# Patient Record
Sex: Male | Born: 1935 | Race: White | Hispanic: No | Marital: Married | Smoking: Former smoker
Health system: Southern US, Community
[De-identification: ages and names within clinical notes are randomized; demographics above are authoritative.]

## PROBLEM LIST (undated history)

## (undated) DIAGNOSIS — K5790 Diverticulosis of intestine, part unspecified, without perforation or abscess without bleeding: Secondary | ICD-10-CM

## (undated) DIAGNOSIS — D509 Iron deficiency anemia, unspecified: Secondary | ICD-10-CM

## (undated) DIAGNOSIS — I2510 Atherosclerotic heart disease of native coronary artery without angina pectoris: Secondary | ICD-10-CM

## (undated) DIAGNOSIS — Z8719 Personal history of other diseases of the digestive system: Secondary | ICD-10-CM

## (undated) DIAGNOSIS — I4891 Unspecified atrial fibrillation: Secondary | ICD-10-CM

## (undated) DIAGNOSIS — Z95810 Presence of automatic (implantable) cardiac defibrillator: Secondary | ICD-10-CM

## (undated) DIAGNOSIS — IMO0002 Reserved for concepts with insufficient information to code with codable children: Secondary | ICD-10-CM

## (undated) DIAGNOSIS — I10 Essential (primary) hypertension: Secondary | ICD-10-CM

## (undated) DIAGNOSIS — Z950 Presence of cardiac pacemaker: Secondary | ICD-10-CM

## (undated) DIAGNOSIS — K2970 Gastritis, unspecified, without bleeding: Secondary | ICD-10-CM

## (undated) DIAGNOSIS — K449 Diaphragmatic hernia without obstruction or gangrene: Secondary | ICD-10-CM

## (undated) DIAGNOSIS — E119 Type 2 diabetes mellitus without complications: Secondary | ICD-10-CM

## (undated) DIAGNOSIS — I428 Other cardiomyopathies: Secondary | ICD-10-CM

## (undated) HISTORY — DX: Unspecified atrial fibrillation: I48.91

## (undated) HISTORY — DX: Other cardiomyopathies: I42.8

## (undated) HISTORY — PX: CARDIAC DEFIBRILLATOR PLACEMENT: SHX171

## (undated) HISTORY — DX: Diverticulosis of intestine, part unspecified, without perforation or abscess without bleeding: K57.90

## (undated) HISTORY — PX: COLONOSCOPY: SHX174

## (undated) HISTORY — DX: Type 2 diabetes mellitus without complications: E11.9

## (undated) HISTORY — DX: Iron deficiency anemia, unspecified: D50.9

## (undated) HISTORY — DX: Gastritis, unspecified, without bleeding: K29.70

## (undated) HISTORY — DX: Presence of automatic (implantable) cardiac defibrillator: Z95.810

## (undated) HISTORY — DX: Diaphragmatic hernia without obstruction or gangrene: K44.9

## (undated) HISTORY — DX: Personal history of other diseases of the digestive system: Z87.19

## (undated) HISTORY — DX: Atherosclerotic heart disease of native coronary artery without angina pectoris: I25.10

---

## 2009-02-18 HISTORY — PX: CARDIAC CATHETERIZATION: SHX172

## 2011-02-19 HISTORY — PX: CARDIAC CATHETERIZATION: SHX172

## 2012-01-27 HISTORY — PX: OTHER SURGICAL HISTORY: SHX169

## 2012-01-27 HISTORY — PX: PACEMAKER INSERTION: SHX728

## 2012-02-19 HISTORY — PX: FETAL BLOOD TRANSFUSION: SHX1602

## 2012-02-19 HISTORY — PX: UPPER GASTROINTESTINAL ENDOSCOPY: SHX188

## 2012-12-30 ENCOUNTER — Inpatient Hospital Stay: Payer: Self-pay | Admitting: Internal Medicine

## 2012-12-30 DIAGNOSIS — I4892 Unspecified atrial flutter: Secondary | ICD-10-CM

## 2012-12-30 DIAGNOSIS — Z7901 Long term (current) use of anticoagulants: Secondary | ICD-10-CM

## 2012-12-30 DIAGNOSIS — I428 Other cardiomyopathies: Secondary | ICD-10-CM

## 2012-12-30 DIAGNOSIS — I059 Rheumatic mitral valve disease, unspecified: Secondary | ICD-10-CM

## 2012-12-30 DIAGNOSIS — I4891 Unspecified atrial fibrillation: Secondary | ICD-10-CM

## 2012-12-30 LAB — CBC WITH DIFFERENTIAL/PLATELET
Basophil #: 0.1 10*3/uL (ref 0.0–0.1)
Basophil %: 1 %
Eosinophil #: 0.2 10*3/uL (ref 0.0–0.7)
Eosinophil %: 1.1 %
HCT: 24.2 % — ABNORMAL LOW (ref 40.0–52.0)
Lymphocyte %: 17.2 %
MCHC: 28.3 g/dL — ABNORMAL LOW (ref 32.0–36.0)
Monocyte #: 1.1 x10 3/mm — ABNORMAL HIGH (ref 0.2–1.0)
Neutrophil #: 10.2 10*3/uL — ABNORMAL HIGH (ref 1.4–6.5)
Platelet: 378 10*3/uL (ref 150–440)
RBC: 4.29 10*6/uL — ABNORMAL LOW (ref 4.40–5.90)
RDW: 19 % — ABNORMAL HIGH (ref 11.5–14.5)
WBC: 14 10*3/uL — ABNORMAL HIGH (ref 3.8–10.6)

## 2012-12-30 LAB — COMPREHENSIVE METABOLIC PANEL
Albumin: 3.2 g/dL — ABNORMAL LOW (ref 3.4–5.0)
Anion Gap: 4 — ABNORMAL LOW (ref 7–16)
BUN: 26 mg/dL — ABNORMAL HIGH (ref 7–18)
Calcium, Total: 8.9 mg/dL (ref 8.5–10.1)
Chloride: 107 mmol/L (ref 98–107)
Creatinine: 1.18 mg/dL (ref 0.60–1.30)
Glucose: 188 mg/dL — ABNORMAL HIGH (ref 65–99)
Osmolality: 285 (ref 275–301)
Potassium: 4 mmol/L (ref 3.5–5.1)
SGOT(AST): 32 U/L (ref 15–37)
Sodium: 138 mmol/L (ref 136–145)
Total Protein: 7.6 g/dL (ref 6.4–8.2)

## 2012-12-30 LAB — CK TOTAL AND CKMB (NOT AT ARMC)
CK, Total: 67 U/L (ref 35–232)
CK, Total: 58 U/L (ref 35–232)
CK-MB: 1.6 ng/mL (ref 0.5–3.6)
CK-MB: 1.9 ng/mL (ref 0.5–3.6)

## 2012-12-30 LAB — DIGOXIN LEVEL: Digoxin: 1.03 ng/mL

## 2012-12-30 LAB — PROTIME-INR
INR: 1.3
Prothrombin Time: 16.6 secs — ABNORMAL HIGH (ref 11.5–14.7)

## 2012-12-30 LAB — TROPONIN I: Troponin-I: 0.08 ng/mL — ABNORMAL HIGH

## 2012-12-30 LAB — HEMOGLOBIN: HGB: 7.9 g/dL — ABNORMAL LOW (ref 13.0–18.0)

## 2012-12-30 LAB — PRO B NATRIURETIC PEPTIDE: B-Type Natriuretic Peptide: 1747 pg/mL — ABNORMAL HIGH (ref 0–450)

## 2012-12-31 DIAGNOSIS — I4891 Unspecified atrial fibrillation: Secondary | ICD-10-CM

## 2012-12-31 DIAGNOSIS — Z7901 Long term (current) use of anticoagulants: Secondary | ICD-10-CM

## 2012-12-31 DIAGNOSIS — I4892 Unspecified atrial flutter: Secondary | ICD-10-CM

## 2012-12-31 LAB — CBC WITH DIFFERENTIAL/PLATELET
Basophil %: 1.2 %
Eosinophil #: 0.2 10*3/uL (ref 0.0–0.7)
Eosinophil %: 1.9 %
HCT: 29.1 % — ABNORMAL LOW (ref 40.0–52.0)
Lymphocyte #: 1.5 10*3/uL (ref 1.0–3.6)
Lymphocyte %: 12.9 %
MCHC: 30.6 g/dL — ABNORMAL LOW (ref 32.0–36.0)
MCV: 62 fL — ABNORMAL LOW (ref 80–100)
Monocyte #: 1.1 x10 3/mm — ABNORMAL HIGH (ref 0.2–1.0)
Neutrophil #: 8.6 10*3/uL — ABNORMAL HIGH (ref 1.4–6.5)
Platelet: 294 10*3/uL (ref 150–440)
RBC: 4.73 10*6/uL (ref 4.40–5.90)
RDW: 27 % — ABNORMAL HIGH (ref 11.5–14.5)
WBC: 11.5 10*3/uL — ABNORMAL HIGH (ref 3.8–10.6)

## 2012-12-31 LAB — URINALYSIS, COMPLETE
Bacteria: NONE SEEN
Bilirubin,UR: NEGATIVE
Leukocyte Esterase: NEGATIVE
Protein: NEGATIVE
RBC,UR: 3 /HPF (ref 0–5)
Specific Gravity: 1.025 (ref 1.003–1.030)
WBC UR: 6 /HPF (ref 0–5)

## 2012-12-31 LAB — BASIC METABOLIC PANEL
BUN: 23 mg/dL — ABNORMAL HIGH (ref 7–18)
Calcium, Total: 8.6 mg/dL (ref 8.5–10.1)
EGFR (Non-African Amer.): >60
Glucose: 135 mg/dL — ABNORMAL HIGH (ref 65–99)
Sodium: 139 mmol/L (ref 136–145)

## 2012-12-31 LAB — TROPONIN I: Troponin-I: 0.07 ng/mL — ABNORMAL HIGH

## 2012-12-31 LAB — KOH PREP

## 2013-01-01 LAB — CBC WITH DIFFERENTIAL/PLATELET
Basophil %: 0.6 %
Lymphocyte #: 1.5 10*3/uL (ref 1.0–3.6)
Lymphocyte %: 15.9 %
MCH: 19.4 pg — ABNORMAL LOW (ref 26.0–34.0)
MCHC: 31 g/dL — ABNORMAL LOW (ref 32.0–36.0)
MCV: 63 fL — ABNORMAL LOW (ref 80–100)
Monocyte #: 0.9 x10 3/mm (ref 0.2–1.0)
Monocyte %: 9.9 %
Neutrophil #: 6.6 10*3/uL — ABNORMAL HIGH (ref 1.4–6.5)
Platelet: 252 10*3/uL (ref 150–440)
RBC: 4.38 10*6/uL — ABNORMAL LOW (ref 4.40–5.90)
RDW: 27.2 % — ABNORMAL HIGH (ref 11.5–14.5)
WBC: 9.4 10*3/uL (ref 3.8–10.6)

## 2013-01-01 LAB — BASIC METABOLIC PANEL
Anion Gap: 5 — ABNORMAL LOW (ref 7–16)
Chloride: 107 mmol/L (ref 98–107)
Co2: 27 mmol/L (ref 21–32)
EGFR (African American): >60
EGFR (Non-African Amer.): >60
Glucose: 125 mg/dL — ABNORMAL HIGH (ref 65–99)
Osmolality: 281 (ref 275–301)
Potassium: 3.5 mmol/L (ref 3.5–5.1)
Sodium: 139 mmol/L (ref 136–145)

## 2013-01-04 LAB — IRON AND TIBC
Iron Saturation: 6 %
Iron: 26 ug/dL — ABNORMAL LOW (ref 65–175)
Unbound Iron-Bind.Cap.: 429 ug/dL

## 2013-01-05 DIAGNOSIS — I428 Other cardiomyopathies: Secondary | ICD-10-CM

## 2013-01-05 DIAGNOSIS — I5020 Unspecified systolic (congestive) heart failure: Secondary | ICD-10-CM

## 2013-01-05 LAB — CBC WITH DIFFERENTIAL/PLATELET
HCT: 32 % — ABNORMAL LOW (ref 40.0–52.0)
Lymphocyte %: 12.6 %
MCH: 19 pg — ABNORMAL LOW (ref 26.0–34.0)
MCHC: 30.4 g/dL — ABNORMAL LOW (ref 32.0–36.0)
Monocyte %: 8.7 %

## 2013-01-07 ENCOUNTER — Telehealth: Payer: Self-pay

## 2013-01-07 NOTE — Telephone Encounter (Signed)
Patient contacted regarding discharge from [facility] on 01/04/13.  Patient understands to follow up with provider Dr. Mariah Milling on 01/12/13 at 10:15 at [facility]. Patient understands discharge instructions? yes Patient understands medications and regiment? yes Patient understands to bring all medications to this visit? yes

## 2013-01-12 ENCOUNTER — Encounter (INDEPENDENT_AMBULATORY_CARE_PROVIDER_SITE_OTHER): Payer: Self-pay

## 2013-01-12 ENCOUNTER — Encounter: Payer: Self-pay | Admitting: Cardiovascular Disease

## 2013-01-12 ENCOUNTER — Ambulatory Visit (INDEPENDENT_AMBULATORY_CARE_PROVIDER_SITE_OTHER): Payer: Medicare Other | Admitting: Cardiovascular Disease

## 2013-01-12 VITALS — BP 120/70 | HR 79 | Ht 74.0 in | Wt 235.5 lb

## 2013-01-12 DIAGNOSIS — K922 Gastrointestinal hemorrhage, unspecified: Secondary | ICD-10-CM | POA: Insufficient documentation

## 2013-01-12 DIAGNOSIS — Z95810 Presence of automatic (implantable) cardiac defibrillator: Secondary | ICD-10-CM

## 2013-01-12 DIAGNOSIS — I4891 Unspecified atrial fibrillation: Secondary | ICD-10-CM

## 2013-01-12 DIAGNOSIS — I5042 Chronic combined systolic (congestive) and diastolic (congestive) heart failure: Secondary | ICD-10-CM

## 2013-01-12 DIAGNOSIS — I509 Heart failure, unspecified: Secondary | ICD-10-CM

## 2013-01-12 DIAGNOSIS — I428 Other cardiomyopathies: Secondary | ICD-10-CM

## 2013-01-12 DIAGNOSIS — I420 Dilated cardiomyopathy: Secondary | ICD-10-CM

## 2013-01-12 HISTORY — DX: Presence of automatic (implantable) cardiac defibrillator: Z95.810

## 2013-01-12 NOTE — Assessment & Plan Note (Signed)
Suggested he restart his anticoagulation in one week time. He is on PPI twice a day. Heart rate reasonably well controlled (paced).

## 2013-01-12 NOTE — Assessment & Plan Note (Signed)
Suggested he continue his current medications. No changes made at this time. Appears relatively euvolemic

## 2013-01-12 NOTE — Assessment & Plan Note (Addendum)
He has indicated he would like to transfer his care from [facility] to our office. We will make an appointment with him to see Dr. Sherryl Manges. He reports having a Medtronic device.    Notes indicate device was placed 01/27/2012 at [facility] with indication class III congestive heart failure, left bundle branch block  with QRS 170 ms, underlying atrial fibrillation, ejection fraction of less than 30%

## 2013-01-12 NOTE — Progress Notes (Addendum)
Patient ID: Nicolas Morris, male    DOB: Jul 17, 1933, 79 y.o.   MRN: [id number]  HPI Comments: 79 year old gentleman with nonischemic cardiomyopathy, cardiac catheterization October 2010 showing no significant CAD, ejection fraction 45% at that time, with notes indicating ejection fraction less than 30% at [facility] at the end of 2013, atrial fibrillation/flutter, type 2 diabetes, previously seen at [facility], started on anticoagulation for atrial fibrillation several years ago, ICD placement December 2013, several cardiac catheterizations in 2014 one in January and one in May, by his report with several ICD shocks in October 2014, recent admission to the hospital for bright red blood per rectum. Cardiology was consulted for management of his atrial fibrillation, anticoagulation, chest pain. Xarelto was held and recommendation was for him to hold this for at least 2 weeks. He presents now one week after discharge from the hospital. He did have 3 units of packed red blood cells in the hospital for initial hemoglobin of 6.9.  In followup today, he reports that he has been feeling better. No further chest pain after his blood transfusions. It was felt that he had demand ischemia in the setting of anemia with improvement after transfusion. Cardiac enzymes were negative during his hospital course.   Echocardiogram showed ejection fraction 45-50%. Inferior and posterior wall hypokinesis, ICD wire noted, mildly elevated right ventricular systolic pressures, mild MR, mildly dilated left atrium  BNP 1700, creatinine 1.18, normal LFTs CT abdomen and pelvis showed bilateral pleural effusions right greater than left, hepatic Steve tightness with early cirrhosis with ascites, mild splenomegaly, iron levels were extremely low at 26, iron saturation 6  EGD was performed that showed small hiatal hernia, esophagitis, erosions/gastritis, gastric varices, diverticulosis on colonoscopy with some internal  hemorrhoids. He was evaluated by Dr. Marva Panda and started on PPI twice a day  EKG today shows ventricularly  paced rhythm rate 79 beats per minute   Outpatient Encounter Prescriptions as of 01/12/2013  Medication Sig  . acetaminophen (TYLENOL) 325 MG tablet Take 650 mg by mouth every 4 (four) hours as needed.  . carvedilol (COREG) 6.25 MG tablet Take 6.25 mg by mouth 2 (two) times daily with a meal.  . digoxin (LANOXIN) 0.25 MG tablet Take 0.25 mg by mouth daily.  . furosemide (LASIX) 40 MG tablet Take 40 mg by mouth 2 (two) times daily.  . meclizine (ANTIVERT) 25 MG tablet Take 25 mg by mouth 3 (three) times daily as needed for dizziness.  . metFORMIN (GLUCOPHAGE) 500 MG tablet Take by mouth 2 (two) times daily with a meal.  . pantoprazole (PROTONIX) 40 MG tablet Take 40 mg by mouth 2 (two) times daily.  . potassium chloride (MICRO-K) 10 MEQ CR capsule Take 20 mEq by mouth daily.   . sertraline (ZOLOFT) 100 MG tablet Take 100 mg by mouth daily.  . traZODone (DESYREL) 50 MG tablet Take 50 mg by mouth at bedtime.  . [DISCONTINUED] Ferrous Sulfate (FEROSUL PO) Take 325 mg by mouth 2 (two) times daily.     Review of Systems  Constitutional: Negative.   HENT: Negative.   Eyes: Negative.   Respiratory: Negative.   Cardiovascular: Negative.   Gastrointestinal: Negative.   Endocrine: Negative.   Musculoskeletal: Negative.   Skin: Negative.   Allergic/Immunologic: Negative.   Neurological: Negative.   Hematological: Negative.   Psychiatric/Behavioral: Negative.   All other systems reviewed and are negative.    BP 120/70  Pulse 79  Ht 6\' 2"  (1.88 m)  Wt 235  lb 8 oz (106.822 kg)  BMI 30.22 kg/m2  Physical Exam  Nursing note and vitals reviewed. Constitutional: He is oriented to person, place, and time. He appears well-developed and well-nourished.  HENT:  Head: Normocephalic.  Nose: Nose normal.  Mouth/Throat: Oropharynx is clear and moist.  Eyes: Conjunctivae are normal.  Pupils are equal, round, and reactive to light.  Neck: Normal range of motion. Neck supple. No JVD present.  Cardiovascular: Normal rate, regular rhythm, S1 normal, S2 normal, normal heart sounds and intact distal pulses.  Exam reveals no gallop and no friction rub.   No murmur heard. Pulmonary/Chest: Effort normal and breath sounds normal. No respiratory distress. He has no wheezes. He has no rales. He exhibits no tenderness.  Abdominal: Soft. Bowel sounds are normal. He exhibits no distension. There is no tenderness.  Musculoskeletal: Normal range of motion. He exhibits no edema and no tenderness.  Lymphadenopathy:    He has no cervical adenopathy.  Neurological: He is alert and oriented to person, place, and time. Coordination normal.  Skin: Skin is warm and dry. No rash noted. No erythema.  Psychiatric: He has a normal mood and affect. His behavior is normal. Judgment and thought content normal.      Assessment and Plan

## 2013-01-12 NOTE — Assessment & Plan Note (Signed)
He denies any discolored or maroon stools. We have suggested he closely monitor his stool color after he restarts anticoagulation.

## 2013-01-12 NOTE — Assessment & Plan Note (Addendum)
Mildly depressed ejection fraction. Cardiac catheterization from the [facility] [place] heart care dated 12/01/2008 showing torturous but normal coronary arteries, elevated systemic and left ventricular filling pressures, ejection fraction 45-50%, mild distal anterior hypokinesis, recommendation made for treatment of blood pressure at that time

## 2013-01-12 NOTE — Patient Instructions (Signed)
You are doing well. No medication changes were made.  Restart xarelto in one week December 2nd, one a day Keep a look out for dark, black stool We will set up a follow up appt with electrophysiology to check ICD  Please call us if you have new issues that need to be addressed before your next appt.  Your physician wants you to follow-up in: 3 months.  You will receive a reminder letter in the mail two months in advance. If you don't receive a letter, please call our office to schedule the follow-up appointment.

## 2013-01-20 ENCOUNTER — Other Ambulatory Visit: Payer: Self-pay

## 2013-01-20 MED ORDER — FUROSEMIDE 40 MG PO TABS: 40.0000 mg | ORAL_TABLET | Freq: Two times a day (BID) | ORAL | Status: DC

## 2013-01-20 NOTE — Telephone Encounter (Signed)
Requested Prescriptions   Signed Prescriptions Disp Refills  . furosemide (LASIX) 40 MG tablet 30 tablet 3    Sig: Take 1 tablet (40 mg total) by mouth 2 (two) times daily.    Authorizing Provider: Antonieta Iba    Ordering User: Kendrick Fries

## 2013-01-21 ENCOUNTER — Encounter: Payer: Medicare Other | Admitting: Internal Medicine

## 2013-01-22 ENCOUNTER — Telehealth: Payer: Self-pay

## 2013-01-22 NOTE — Telephone Encounter (Signed)
Checking status of medical necessity faxed yesterday for a walker for pt please call

## 2013-01-22 NOTE — Telephone Encounter (Signed)
Left message for Nedra Hai that I have not received a LMN for pt. Asked him to resend & left fax #.

## 2013-01-26 ENCOUNTER — Encounter: Payer: Self-pay | Admitting: Internal Medicine

## 2013-01-26 ENCOUNTER — Ambulatory Visit (INDEPENDENT_AMBULATORY_CARE_PROVIDER_SITE_OTHER): Payer: Medicare Other | Admitting: Internal Medicine

## 2013-01-26 VITALS — BP 120/62 | HR 72 | Ht 74.0 in | Wt 244.2 lb

## 2013-01-26 DIAGNOSIS — I420 Dilated cardiomyopathy: Secondary | ICD-10-CM

## 2013-01-26 DIAGNOSIS — F329 Major depressive disorder, single episode, unspecified: Secondary | ICD-10-CM | POA: Insufficient documentation

## 2013-01-26 DIAGNOSIS — I428 Other cardiomyopathies: Secondary | ICD-10-CM

## 2013-01-26 DIAGNOSIS — I5042 Chronic combined systolic (congestive) and diastolic (congestive) heart failure: Secondary | ICD-10-CM

## 2013-01-26 DIAGNOSIS — I509 Heart failure, unspecified: Secondary | ICD-10-CM

## 2013-01-26 LAB — MDC_IDC_ENUM_SESS_TYPE_INCLINIC
Battery Voltage: 3.01 V
Brady Statistic AP VP Percent: 0 %
Brady Statistic AS VP Percent: 0 %
Brady Statistic RA Percent Paced: 0 %
Brady Statistic RV Percent Paced: 94 %
HighPow Impedance: 67 Ohm
Lead Channel Impedance Value: 570 Ohm
Lead Channel Pacing Threshold Amplitude: 0.625 V
Lead Channel Pacing Threshold Amplitude: 1 V
Lead Channel Pacing Threshold Pulse Width: 0.4 ms
Lead Channel Sensing Intrinsic Amplitude: 0.25 mV
Lead Channel Setting Pacing Amplitude: 2 V
Lead Channel Setting Pacing Pulse Width: 0.4 ms
Lead Channel Setting Sensing Sensitivity: 0.3 mV
Zone Setting Detection Interval: 350 ms
Zone Setting Detection Interval: 360 ms
Zone Setting Detection Interval: 400 ms

## 2013-01-26 MED ORDER — CARVEDILOL 6.25 MG PO TABS: 12.5000 mg | ORAL_TABLET | Freq: Two times a day (BID) | ORAL | Status: DC

## 2013-01-26 MED ORDER — DIGOXIN 250 MCG PO TABS: ORAL_TABLET | ORAL | Status: DC

## 2013-01-26 NOTE — Assessment & Plan Note (Signed)
The patient's device was interrogated.  The information was reviewed. No changes were made in the programming.   Interrogation of the device demonstrated NO prior ICD discharges.
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
## 2013-01-26 NOTE — Assessment & Plan Note (Signed)
The patient has atrial fibrillation which is permanent. He CRT-D device was inserted with a plug atrial port. Bleeding has complicated anticoagulation. He has significant heart failure symptoms and his only ventricularly paced 94% of the time. In this patient grew, it is expected that this is an over estimate of pacing secondary fusion and pseudofusion  We will increase his carvedilol to see if we can reduce AV nodal conduction and improved biventricular pacing up to a goal of 98+ percent. At the same time.we will see whether increasing her carvedilol aggravates his fatigue.  I wonder whether his atrial fibrillation and rate control isn't significantly contributing to his exercise intolerance. Treadmill testing may be of value. He is not very active and so it is hard, given the discrimination of the defect within the Medtronic system, to know  what his heart rates are with activity

## 2013-01-26 NOTE — Assessment & Plan Note (Signed)
He has class III symptoms. I suspect some of this is COPD, some of it is congestive heart failure and some of his fatigue may related also to his neuropsychiatric conditions

## 2013-01-26 NOTE — Assessment & Plan Note (Signed)
Will asked him to followup with his PCP

## 2013-01-26 NOTE — Patient Instructions (Signed)
Your physician recommends that you schedule a follow-up appointment in: 8 weeks with Dr. Mariah Milling   Your physician has recommended you make the following change in your medication:  Increase Coreg 12.5mg   Decrease Digoxin to 0.5 tablet     Your physician wants you to follow-up in: 6 months with Dr. Graciela Husbands. You will receive a reminder letter in the mail two months in advance. If you don't receive a letter, please call our office to schedule the follow-up appointment.;  Follow up with primary care provider for depression.   Your physician recommends that you schedule a follow-up appointment in: 3 months with Gunnar Fusi for device check   Your physician has recommended that you have a sleep study. This test records several body functions during sleep, including: brain activity, eye movement, oxygen and carbon dioxide blood levels, heart rate and rhythm, breathing rate and rhythm, the flow of air through your mouth and nose, snoring, body muscle movements, and chest and belly movement.

## 2013-01-26 NOTE — Assessment & Plan Note (Signed)
Is euvolemic. His digoxin level in hospital was 1.03. This is borderline. Given the fact that his LV function is largely improved, we will decrease the dose from 0.25-0.125

## 2013-02-01 ENCOUNTER — Telehealth: Payer: Self-pay

## 2013-02-01 NOTE — Telephone Encounter (Signed)
Faxed documentation of face-to-face encounter to [facility] and Statement of Medical Necessity for folding walker w/ wheel, w/o seat to [phone].

## 2013-02-01 NOTE — Telephone Encounter (Signed)
Sent order to [facility] for OT and PT. Faxed to [phone].

## 2013-02-03 ENCOUNTER — Telehealth: Payer: Self-pay

## 2013-02-03 ENCOUNTER — Encounter: Payer: Self-pay | Admitting: *Deleted

## 2013-02-03 NOTE — Telephone Encounter (Signed)
Spoke w/ Trey Paula.  Gave him verbal order to educate pt on diabetes mgmt and chf.

## 2013-02-03 NOTE — Telephone Encounter (Signed)
Pt Physical therapist called and states that pt has CHF and needs a verbal order for nurse to come and educate pt. Please call.

## 2013-02-05 ENCOUNTER — Telehealth: Payer: Self-pay | Admitting: *Deleted

## 2013-02-05 NOTE — Telephone Encounter (Signed)
Gave verbal order to Stuart, Charity fundraiser for home health aide and Child psychotherapist.

## 2013-02-05 NOTE — Telephone Encounter (Signed)
Needs orders for Home Health aide and social worker. Thanks

## 2013-03-26 ENCOUNTER — Telehealth: Payer: Self-pay | Admitting: *Deleted

## 2013-03-26 NOTE — Telephone Encounter (Signed)
Vickie to refax 1/6

## 2013-03-26 NOTE — Telephone Encounter (Signed)
Home Health nurse called regarding a face to face document that was faxed on 03/20/13. Please complete form stat or call Vickie. Fax # [phone]

## 2013-03-30 NOTE — Telephone Encounter (Signed)
In Dr. Ethelene Hal folder to be signed

## 2013-04-14 ENCOUNTER — Ambulatory Visit: Payer: Medicare Other | Admitting: Cardiovascular Disease

## 2013-04-21 ENCOUNTER — Encounter: Payer: Self-pay | Admitting: Cardiovascular Disease

## 2013-04-21 ENCOUNTER — Ambulatory Visit (INDEPENDENT_AMBULATORY_CARE_PROVIDER_SITE_OTHER): Payer: Medicare Other | Admitting: Cardiovascular Disease

## 2013-04-21 VITALS — BP 130/68 | HR 78 | Ht 74.0 in | Wt 253.2 lb

## 2013-04-21 DIAGNOSIS — I4891 Unspecified atrial fibrillation: Secondary | ICD-10-CM

## 2013-04-21 DIAGNOSIS — M542 Cervicalgia: Secondary | ICD-10-CM

## 2013-04-21 DIAGNOSIS — I509 Heart failure, unspecified: Secondary | ICD-10-CM

## 2013-04-21 DIAGNOSIS — I5042 Chronic combined systolic (congestive) and diastolic (congestive) heart failure: Secondary | ICD-10-CM

## 2013-04-21 DIAGNOSIS — K922 Gastrointestinal hemorrhage, unspecified: Secondary | ICD-10-CM

## 2013-04-21 NOTE — Assessment & Plan Note (Signed)
Currently takes Lasix 40 mg twice a day. Appears relatively euvolemic. Encouraged him to stay on his same medication regimen

## 2013-04-21 NOTE — Assessment & Plan Note (Signed)
He reports no GI bleeding. We'll continue anticoagulation

## 2013-04-21 NOTE — Assessment & Plan Note (Signed)
Heart rate well controlled. Tolerating anticoagulation with xarelto 20 mg daily.

## 2013-04-21 NOTE — Assessment & Plan Note (Signed)
Recently seen by neurology for his neck pain. CT scan pending

## 2013-04-21 NOTE — Progress Notes (Signed)
Patient ID: Nicolas Morris, male    DOB: 1933-09-29, 80 y.o.   MRN: [id number]  HPI Comments: 80 year old gentleman with nonischemic cardiomyopathy, cardiac catheterization October 2010 showing no significant CAD, ejection fraction 45% at that time, with notes indicating ejection fraction less than 30% at [facility] at the end of 2013, atrial fibrillation/flutter, type 2 diabetes, previously seen at [facility], started on anticoagulation for atrial fibrillation several years ago, ICD placement December 2013, several cardiac catheterizations in 2014 one in January and one in May, by his report with several ICD shocks in October 2014,  admission to the hospital for bright red blood per rectum. Cardiology was consulted for management of his atrial fibrillation, anticoagulation, chest pain. Xarelto was held and recommendation was for him to hold this for at least 2 weeks.  He did have 3 units of packed red blood cells in the hospital for initial hemoglobin of 6.9. It was felt that he had demand ischemia in the setting of anemia with improvement after transfusion. Cardiac enzymes were negative during his hospital course.   Echocardiogram showed ejection fraction 45-50%. Inferior and posterior wall hypokinesis, ICD wire noted, mildly elevated right ventricular systolic pressures, mild MR, mildly dilated left atrium  CT abdomen and pelvis showed bilateral pleural effusions right greater than left, hepatic Steve tightness with early cirrhosis with ascites, mild splenomegaly, iron levels were extremely low at 26, iron saturation 6  EGD was performed that showed small hiatal hernia, esophagitis, erosions/gastritis, gastric varices, diverticulosis on colonoscopy with some internal hemorrhoids. He was evaluated by Dr. Marva Panda and started on PPI twice a day  In followup today, his biggest complaint is his neck pain. He was recently seen by Dr. Jimmey Ralph of neurology and is scheduled for CT scan of his neck.  Family who presents with him today reports that he is not bathing himself, not doing his ADLs. [facility] and he refused their help. He denies any significant shortness of breath or chest pain. No orthopnea or PND. He is scheduled to see the chronic pain clinic for his neck  EKG today shows ventricularly  paced rhythm rate 78 beats per minute   Outpatient Encounter Prescriptions as of 04/21/2013  Medication Sig  . acetaminophen (TYLENOL) 325 MG tablet Take 650 mg by mouth every 4 (four) hours as needed.  Marland Kitchen aspirin 81 MG tablet Take 81 mg by mouth daily.  . carvedilol (COREG) 6.25 MG tablet Take 2 tablets (12.5 mg total) by mouth 2 (two) times daily with a meal.  . digoxin (LANOXIN) 0.25 MG tablet Take 0.25 mg by mouth daily.  . furosemide (LASIX) 40 MG tablet Take 1 tablet (40 mg total) by mouth 2 (two) times daily.  Marland Kitchen gabapentin (NEURONTIN) 300 MG capsule Take 300 mg by mouth 2 (two) times daily.  . meclizine (ANTIVERT) 25 MG tablet Take 25 mg by mouth 3 (three) times daily as needed for dizziness.  . metFORMIN (GLUCOPHAGE) 500 MG tablet Take by mouth 2 (two) times daily with a meal.  . pantoprazole (PROTONIX) 40 MG tablet Take 40 mg by mouth daily.   . potassium chloride (MICRO-K) 10 MEQ CR capsule Take 20 mEq by mouth daily.   . Rivaroxaban (XARELTO) 20 MG TABS tablet Take 20 mg by mouth daily with supper.  . sertraline (ZOLOFT) 100 MG tablet Take 100 mg by mouth daily.  . traZODone (DESYREL) 50 MG tablet Take 50 mg by mouth at bedtime.    Review  of Systems  Constitutional: Negative.   HENT: Negative.   Eyes: Negative.   Respiratory: Negative.   Cardiovascular: Negative.   Gastrointestinal: Negative.   Endocrine: Negative.   Musculoskeletal: Negative.   Skin: Negative.   Allergic/Immunologic: Negative.   Neurological: Negative.   Hematological: Negative.   Psychiatric/Behavioral: Negative.   All other systems reviewed and are negative.    BP 130/68   Pulse 78  Ht 6\' 2"  (1.88 m)  Wt 253 lb 4 oz (114.873 kg)  BMI 32.50 kg/m2  Physical Exam  Nursing note and vitals reviewed. Constitutional: He is oriented to person, place, and time. He appears well-developed and well-nourished.  HENT:  Head: Normocephalic.  Nose: Nose normal.  Mouth/Throat: Oropharynx is clear and moist.  Eyes: Conjunctivae are normal. Pupils are equal, round, and reactive to light.  Neck: Normal range of motion. Neck supple. No JVD present.  Cardiovascular: Normal rate, regular rhythm, S1 normal, S2 normal, normal heart sounds and intact distal pulses.  Exam reveals no gallop and no friction rub.   No murmur heard. Pulmonary/Chest: Effort normal and breath sounds normal. No respiratory distress. He has no wheezes. He has no rales. He exhibits no tenderness.  Abdominal: Soft. Bowel sounds are normal. He exhibits no distension. There is no tenderness.  Musculoskeletal: Normal range of motion. He exhibits no edema and no tenderness.  Lymphadenopathy:    He has no cervical adenopathy.  Neurological: He is alert and oriented to person, place, and time. Coordination normal.  Skin: Skin is warm and dry. No rash noted. No erythema.  Psychiatric: He has a normal mood and affect. His behavior is normal. Judgment and thought content normal.      Assessment and Plan

## 2013-04-21 NOTE — Patient Instructions (Signed)
You are doing well. No medication changes were made.  Please call us if you have new issues that need to be addressed before your next appt.  Your physician wants you to follow-up in: 6 months.  You will receive a reminder letter in the mail two months in advance. If you don't receive a letter, please call our office to schedule the follow-up appointment.   

## 2013-04-23 ENCOUNTER — Ambulatory Visit: Payer: Self-pay | Admitting: Neurology

## 2013-04-29 ENCOUNTER — Ambulatory Visit (INDEPENDENT_AMBULATORY_CARE_PROVIDER_SITE_OTHER): Payer: Medicare Other | Admitting: *Deleted

## 2013-04-29 DIAGNOSIS — I5042 Chronic combined systolic (congestive) and diastolic (congestive) heart failure: Secondary | ICD-10-CM

## 2013-04-29 DIAGNOSIS — I420 Dilated cardiomyopathy: Secondary | ICD-10-CM

## 2013-04-29 DIAGNOSIS — I428 Other cardiomyopathies: Secondary | ICD-10-CM

## 2013-04-29 DIAGNOSIS — I509 Heart failure, unspecified: Secondary | ICD-10-CM

## 2013-04-29 LAB — MDC_IDC_ENUM_SESS_TYPE_INCLINIC
HighPow Impedance: 69 Ohm
Lead Channel Impedance Value: 494 Ohm
Lead Channel Pacing Threshold Amplitude: 0.75 V
Lead Channel Pacing Threshold Amplitude: 0.75 V
Lead Channel Pacing Threshold Pulse Width: 0.4 ms
Lead Channel Pacing Threshold Pulse Width: 0.4 ms
Lead Channel Setting Pacing Amplitude: 2 V
Lead Channel Setting Pacing Pulse Width: 0.4 ms
MDC IDC MSMT BATTERY REMAINING LONGEVITY: 90 mo
MDC IDC MSMT LEADCHNL LV IMPEDANCE VALUE: 513 Ohm
MDC IDC MSMT LEADCHNL RV SENSING INTR AMPL: 9.4 mV
MDC IDC SET LEADCHNL LV PACING AMPLITUDE: 1.5 V
MDC IDC SET LEADCHNL LV PACING PULSEWIDTH: 0.4 ms
MDC IDC SET LEADCHNL RV SENSING SENSITIVITY: 0.3 mV
MDC IDC SET ZONE DETECTION INTERVAL: 320 ms
Zone Setting Detection Interval: 350 ms
Zone Setting Detection Interval: 360 ms
Zone Setting Detection Interval: 400 ms

## 2013-04-29 NOTE — Progress Notes (Signed)
ICD check in office with ICM. 

## 2013-05-25 ENCOUNTER — Encounter: Payer: Self-pay | Admitting: Internal Medicine

## 2013-06-27 ENCOUNTER — Observation Stay: Payer: Self-pay | Admitting: Student

## 2013-06-27 DIAGNOSIS — I4891 Unspecified atrial fibrillation: Secondary | ICD-10-CM

## 2013-06-27 DIAGNOSIS — R072 Precordial pain: Secondary | ICD-10-CM | POA: Diagnosis not present

## 2013-06-27 DIAGNOSIS — R0989 Other specified symptoms and signs involving the circulatory and respiratory systems: Secondary | ICD-10-CM | POA: Diagnosis not present

## 2013-06-27 LAB — CBC WITH DIFFERENTIAL/PLATELET
Basophil #: 0.1 10*3/uL (ref 0.0–0.1)
Basophil %: 0.8 %
EOS PCT: 0.8 %
Eosinophil #: 0.1 10*3/uL (ref 0.0–0.7)
HCT: 31.1 % — AB (ref 40.0–52.0)
HGB: 8.7 g/dL — ABNORMAL LOW (ref 13.0–18.0)
LYMPHS PCT: 12.9 %
Lymphocyte #: 1.7 10*3/uL (ref 1.0–3.6)
MCH: 17.8 pg — ABNORMAL LOW (ref 26.0–34.0)
MCHC: 28.1 g/dL — ABNORMAL LOW (ref 32.0–36.0)
MCV: 63 fL — ABNORMAL LOW (ref 80–100)
Monocyte #: 1.2 x10 3/mm — ABNORMAL HIGH (ref 0.2–1.0)
Monocyte %: 9.1 %
NEUTROS PCT: 76.4 %
Neutrophil #: 9.8 10*3/uL — ABNORMAL HIGH (ref 1.4–6.5)
PLATELETS: 276 10*3/uL (ref 150–440)
RBC: 4.91 10*6/uL (ref 4.40–5.90)
RDW: 20 % — ABNORMAL HIGH (ref 11.5–14.5)
WBC: 12.9 10*3/uL — ABNORMAL HIGH (ref 3.8–10.6)

## 2013-06-27 LAB — COMPREHENSIVE METABOLIC PANEL
ANION GAP: 7 (ref 7–16)
Albumin: 3.4 g/dL (ref 3.4–5.0)
Alkaline Phosphatase: 72 U/L
BUN: 16 mg/dL (ref 7–18)
Bilirubin,Total: 0.4 mg/dL (ref 0.2–1.0)
CALCIUM: 9 mg/dL (ref 8.5–10.1)
CO2: 27 mmol/L (ref 21–32)
Chloride: 102 mmol/L (ref 98–107)
Creatinine: 1.25 mg/dL (ref 0.60–1.30)
GFR CALC NON AF AMER: 55 — AB
GLUCOSE: 164 mg/dL — AB (ref 65–99)
Osmolality: 277 (ref 275–301)
Potassium: 4.2 mmol/L (ref 3.5–5.1)
SGOT(AST): 37 U/L (ref 15–37)
SGPT (ALT): 26 U/L (ref 12–78)
Sodium: 136 mmol/L (ref 136–145)
TOTAL PROTEIN: 7.7 g/dL (ref 6.4–8.2)

## 2013-06-27 LAB — CK-MB
CK-MB: 1 ng/mL (ref 0.5–3.6)
CK-MB: 1.3 ng/mL (ref 0.5–3.6)

## 2013-06-27 LAB — TROPONIN I
TROPONIN-I: 0.03 ng/mL
Troponin-I: <0.02 ng/mL
Troponin-I: 0.03 ng/mL

## 2013-06-27 LAB — PRO B NATRIURETIC PEPTIDE: B-Type Natriuretic Peptide: 1482 pg/mL — ABNORMAL HIGH (ref 0–450)

## 2013-06-27 LAB — DIGOXIN LEVEL: Digoxin: 0.29 ng/mL

## 2013-06-27 LAB — CK TOTAL AND CKMB (NOT AT ARMC)
CK, Total: 224 U/L
CK-MB: 0.9 ng/mL (ref 0.5–3.6)

## 2013-06-28 DIAGNOSIS — I5022 Chronic systolic (congestive) heart failure: Secondary | ICD-10-CM

## 2013-06-28 LAB — CBC WITH DIFFERENTIAL/PLATELET
Basophil #: 0.1 10*3/uL (ref 0.0–0.1)
Basophil %: 0.9 %
EOS ABS: 0.2 10*3/uL (ref 0.0–0.7)
EOS PCT: 1.8 %
HCT: 28.8 % — AB (ref 40.0–52.0)
HGB: 8.3 g/dL — ABNORMAL LOW (ref 13.0–18.0)
LYMPHS ABS: 1.9 10*3/uL (ref 1.0–3.6)
LYMPHS PCT: 22.1 %
MCH: 18.1 pg — ABNORMAL LOW (ref 26.0–34.0)
MCHC: 28.9 g/dL — ABNORMAL LOW (ref 32.0–36.0)
MCV: 63 fL — ABNORMAL LOW (ref 80–100)
MONO ABS: 0.9 x10 3/mm (ref 0.2–1.0)
Monocyte %: 10.8 %
Neutrophil #: 5.4 10*3/uL (ref 1.4–6.5)
Neutrophil %: 64.4 %
Platelet: 244 10*3/uL (ref 150–440)
RBC: 4.61 10*6/uL (ref 4.40–5.90)
RDW: 20 % — ABNORMAL HIGH (ref 11.5–14.5)
WBC: 8.4 10*3/uL (ref 3.8–10.6)

## 2013-06-28 LAB — LIPID PANEL
Cholesterol: 136 mg/dL (ref 0–200)
HDL Cholesterol: 25 mg/dL — ABNORMAL LOW (ref 40–60)
LDL CHOLESTEROL, CALC: 87 mg/dL (ref 0–100)
Triglycerides: 121 mg/dL (ref 0–200)
VLDL Cholesterol, Calc: 24 mg/dL (ref 5–40)

## 2013-06-28 LAB — BASIC METABOLIC PANEL
Anion Gap: 6 — ABNORMAL LOW (ref 7–16)
BUN: 17 mg/dL (ref 7–18)
CALCIUM: 8.8 mg/dL (ref 8.5–10.1)
CO2: 31 mmol/L (ref 21–32)
CREATININE: 1.1 mg/dL (ref 0.60–1.30)
Chloride: 101 mmol/L (ref 98–107)
Glucose: 142 mg/dL — ABNORMAL HIGH (ref 65–99)
OSMOLALITY: 280 (ref 275–301)
POTASSIUM: 3.6 mmol/L (ref 3.5–5.1)
Sodium: 138 mmol/L (ref 136–145)

## 2013-06-28 LAB — HEMOGLOBIN A1C: Hemoglobin A1C: 7.7 % — ABNORMAL HIGH (ref 4.2–6.3)

## 2013-06-30 ENCOUNTER — Telehealth: Payer: Self-pay

## 2013-06-30 NOTE — Telephone Encounter (Signed)
Attempted to contact pt regarding discharge from [facility] on 06/28/13.  Left message for pt to call back.

## 2013-07-01 NOTE — Telephone Encounter (Signed)
Patient contacted regarding discharge from [facility] on 06/28/13.  Patient understands to follow up with Ward Givens, NP on 07/06/13 at 1:15 at [facility]. Patient understands discharge instructions? yes Patient understands medications and regiment? yes Patient understands to bring all medications to this visit? yes  Spoke w/ pt's wife.  She states that she believes pt "faked the whole thing", as she was out with her daughter and feels that pt was jealous of her time away from him. She reports that she may not be able to bring pt to his appt, as she is low on gas money to get him here.  She will call on Monday if she needs to cancel this appt.

## 2013-07-06 ENCOUNTER — Encounter: Payer: Medicare Other | Admitting: Nurse Practitioner

## 2013-07-20 ENCOUNTER — Ambulatory Visit (INDEPENDENT_AMBULATORY_CARE_PROVIDER_SITE_OTHER): Payer: Medicare Other | Admitting: Nurse Practitioner

## 2013-07-20 ENCOUNTER — Encounter: Payer: Self-pay | Admitting: Nurse Practitioner

## 2013-07-20 VITALS — BP 130/62 | HR 77 | Ht 74.0 in | Wt 252.8 lb

## 2013-07-20 DIAGNOSIS — I4891 Unspecified atrial fibrillation: Secondary | ICD-10-CM

## 2013-07-20 DIAGNOSIS — R072 Precordial pain: Secondary | ICD-10-CM | POA: Diagnosis not present

## 2013-07-20 DIAGNOSIS — R0602 Shortness of breath: Secondary | ICD-10-CM

## 2013-07-20 DIAGNOSIS — K449 Diaphragmatic hernia without obstruction or gangrene: Secondary | ICD-10-CM | POA: Insufficient documentation

## 2013-07-20 DIAGNOSIS — R0789 Other chest pain: Secondary | ICD-10-CM

## 2013-07-20 DIAGNOSIS — I2510 Atherosclerotic heart disease of native coronary artery without angina pectoris: Secondary | ICD-10-CM

## 2013-07-20 DIAGNOSIS — I428 Other cardiomyopathies: Secondary | ICD-10-CM | POA: Insufficient documentation

## 2013-07-20 DIAGNOSIS — E119 Type 2 diabetes mellitus without complications: Secondary | ICD-10-CM

## 2013-07-20 DIAGNOSIS — K5790 Diverticulosis of intestine, part unspecified, without perforation or abscess without bleeding: Secondary | ICD-10-CM | POA: Insufficient documentation

## 2013-07-20 NOTE — Progress Notes (Signed)
Patient Name: Nicolas Morris Date of Encounter: 07/20/2013  Primary Care Provider:  Leim FabryALDRIDGE,BARBARA, MD Primary Cardiologist:  Concha Se. Gollan, MD   Patient Profile  80 y/o male with h/o NICM who presents for f/u after recent hospitalization for chest pain.  Problem List   Past Medical History  Diagnosis Date  . Non-obstructive CAD     a. s/p multiple caths @ [facility] - last reportedly 06/2012;  b. 06/2013 Lexi MV: EF 51%, no ischemia/infarct.  . MI (myocardial infarction)   . NICM (nonischemic cardiomyopathy)     a. EF reportedly < 30% 01/2013 ([facility]);  b. 01/2012 s/p MDT DTBB1D1 Viva S CRT-D;  c. 12/2012 Echo: EF 45-50%, inf/post HK, nl RV, mild MR.  . Diabetes mellitus without complication   . A-fib     a. Permanent, on Xarelto.  . H/O: GI bleed     a. 12/2012 EGD: HH, evidence of reflux. Erosive Gastritis. Nl duodenum;  b. 12/2012 Colonoscopy: Sev sigmoid and desc colon diverticulosis, transverse and ascending colon diverticulosis, nonbleeding internal hemorrhoids, no bleeding.  . Iron deficiency anemia   . Biventricular ICD (implantable cardioverter-defibrillator) -Medtronic 01/12/2013    a. 01/2012 [facility]:  01/2012 s/p MDT DTBB1D1 Viva S CRT-D.  Atrial port capped (afib).  . Diverticulosis     a. 12/2012 noted on colonoscopy.  . Hiatal hernia     a. 12/2012 noted on EGD.  Marland Kitchen. Gastritis     a. 12/2012 noted on EGD.   Past Surgical History  Procedure Laterality Date  . Cardiac catheterization  2013    [facility]   . Cardiac catheterization  2011  . Defib implant  01/27/2012    Serial # [id number] H Model # [id number]  . Pacemaker insertion  01/27/2012  . Colonoscopy  32014  . Upper gastrointestinal endoscopy  2014  . Fetal blood transfusion  2014    Allergies  Allergies  Allergen Reactions  . No Known Allergies     HPI  80 y/o male with a h/o NICM, Bi-V ICD, and non-obstructive CAD.  He was recently admitted to [facility] with chest pain and r/o for MI.  He underwent a lexiscan  MV, which showed no evidence of ischemia with an EF of 51%.  Since his d/c home, he has done well.  He has chronic neck pain for which he is using prn ibuprofen - taking 600 - 800 mg daily to every other day.  He denies chest pain, palpitations, dyspnea, pnd, orthopnea, n, v, dizziness, syncope, edema, weight gain, or early satiety.   Home Medications  Prior to Admission medications   Medication Sig Start Date End Date Taking? Authorizing Provider  acetaminophen (TYLENOL) 325 MG tablet Take 650 mg by mouth every 4 (four) hours as needed.   Yes Historical Provider, MD  aspirin 81 MG tablet Take 81 mg by mouth daily.   Yes Historical Provider, MD  atorvastatin (LIPITOR) 20 MG tablet Take 20 mg by mouth daily.   Yes Historical Provider, MD  carvedilol (COREG) 6.25 MG tablet Take 2 tablets (12.5 mg total) by mouth 2 (two) times daily with a meal. 01/26/13  Yes Duke SalviaSteven C Klein, MD  digoxin (LANOXIN) 0.25 MG tablet Take 0.25 mg by mouth daily. 01/26/13  Yes Duke SalviaSteven C Klein, MD  furosemide (LASIX) 40 MG tablet Take 1 tablet (40 mg total) by mouth 2 (two) times daily. 01/20/13  Yes Antonieta Ibaimothy J Gollan, MD  gabapentin (NEURONTIN) 300 MG capsule Take 300 mg by mouth 3 (three) times  daily.    Yes Historical Provider, MD  lisinopril (PRINIVIL,ZESTRIL) 2.5 MG tablet Take 2.5 mg by mouth daily.   Yes Historical Provider, MD  meclizine (ANTIVERT) 25 MG tablet Take 25 mg by mouth 3 (three) times daily as needed for dizziness.   Yes Historical Provider, MD  metFORMIN (GLUCOPHAGE) 500 MG tablet Take by mouth 2 (two) times daily with a meal.   Yes Historical Provider, MD  pantoprazole (PROTONIX) 40 MG tablet Take 40 mg by mouth daily.    Yes Historical Provider, MD  potassium chloride (MICRO-K) 10 MEQ CR capsule Take 20 mEq by mouth daily.    Yes Historical Provider, MD  Rivaroxaban (XARELTO) 20 MG TABS tablet Take 20 mg by mouth daily with supper.   Yes Historical Provider, MD  sertraline (ZOLOFT) 100 MG tablet Take 100 mg  by mouth daily.   Yes Historical Provider, MD  traZODone (DESYREL) 50 MG tablet Take 200 mg by mouth at bedtime.    Yes Historical Provider, MD    Review of Systems  Chronic neck pain as above.  Otherwise, all other systems reviewed and are otherwise negative except as noted above.  Physical Exam  Blood pressure 130/62, pulse 77, height 6\' 2"  (1.88 m), weight 252 lb 12 oz (114.647 kg).  General: Pleasant, NAD Psych: Normal affect. Neuro: Alert and oriented X 3. Moves all extremities spontaneously. HEENT: Normal  Neck: Supple without bruits or JVD. Lungs:  Resp regular and unlabored, CTA. Heart: RRR no s3, s4, or murmurs. Abdomen: Soft, non-tender, non-distended, BS + x 4.  Extremities: No clubbing, cyanosis or edema. DP/PT/Radials 2+ and equal bilaterally.  Accessory Clinical Findings  ECG - V paced, underlying afib, 77.  No acute changes.  Assessment & Plan  1.  Chest pain/H/O nonobstructive CAD:  S/p recent negative MV.  No further c/p or dyspnea.  No further ischemic w/u.   2.  NICM/Chronic combined systolic/diastolic CHF:  Euvolemic on exam.  HR/BP stable.  Wt stable compared to March (though up overall since last year).  Cont bb, lasix, KCl, acei.  EF 51% by MV recently.  Bi-V ICD followed in device clinic.  3.  Afib:  Permanent and rate controlled.  Cont BB and xarelto.  4.  H/O IDA and GIB:  We discussed ibuprofen usage, especially in combination with xarelto.  He is using between 600 and 800 mg most days of the week.  He previously used tylenol but was told not to take this when he was in the hospital.  I recommended that he f/u with PCP for consideration of long term plan for neck pain mgmt as he does have a prior h/o GIB and gastritis by EGD in 12/2012.  He is on a PPI.  5.  Chronic Neck Pain:  See #4.  6.  Dispo:  F/U with Dr. Mariah Milling in 6 mos or sooner if necessary.    Ok Anis, NP 07/20/2013, 3:32 PM

## 2013-07-20 NOTE — Patient Instructions (Signed)
Your physician wants you to follow-up in: 6 months with Dr. Gollan. You will receive a reminder letter in the mail two months in advance. If you don't receive a letter, please call our office to schedule the follow-up appointment.    Your physician recommends that you continue on your current medications as directed. Please refer to the Current Medication list given to you today.     

## 2013-08-02 ENCOUNTER — Encounter: Payer: Medicare Other | Admitting: *Deleted

## 2013-08-06 ENCOUNTER — Encounter: Payer: Self-pay | Admitting: Cardiology

## 2013-09-25 ENCOUNTER — Emergency Department: Payer: Self-pay | Admitting: Emergency Medicine

## 2013-09-25 LAB — CBC WITH DIFFERENTIAL/PLATELET
BASOS PCT: 0.6 %
Basophil #: 0.1 10*3/uL (ref 0.0–0.1)
EOS ABS: 0 10*3/uL (ref 0.0–0.7)
EOS PCT: 0.1 %
HCT: 34 % — AB (ref 40.0–52.0)
HGB: 10 g/dL — ABNORMAL LOW (ref 13.0–18.0)
LYMPHS ABS: 2.2 10*3/uL (ref 1.0–3.6)
Lymphocyte %: 13.9 %
MCH: 17.7 pg — AB (ref 26.0–34.0)
MCHC: 29.5 g/dL — ABNORMAL LOW (ref 32.0–36.0)
MCV: 60 fL — ABNORMAL LOW (ref 80–100)
MONOS PCT: 11.4 %
Monocyte #: 1.8 x10 3/mm — ABNORMAL HIGH (ref 0.2–1.0)
Neutrophil #: 11.6 10*3/uL — ABNORMAL HIGH (ref 1.4–6.5)
Neutrophil %: 74 %
Platelet: 310 10*3/uL (ref 150–440)
RBC: 5.67 10*6/uL (ref 4.40–5.90)
RDW: 19.3 % — ABNORMAL HIGH (ref 11.5–14.5)
WBC: 15.8 10*3/uL — AB (ref 3.8–10.6)

## 2013-09-25 LAB — COMPREHENSIVE METABOLIC PANEL
ALT: 30 U/L
ANION GAP: 7 (ref 7–16)
Albumin: 3.5 g/dL (ref 3.4–5.0)
Alkaline Phosphatase: 90 U/L
BUN: 17 mg/dL (ref 7–18)
Bilirubin,Total: 0.5 mg/dL (ref 0.2–1.0)
CALCIUM: 9.4 mg/dL (ref 8.5–10.1)
CHLORIDE: 96 mmol/L — AB (ref 98–107)
Co2: 27 mmol/L (ref 21–32)
Creatinine: 1.16 mg/dL (ref 0.60–1.30)
EGFR (African American): >60
Glucose: 189 mg/dL — ABNORMAL HIGH (ref 65–99)
Osmolality: 267 (ref 275–301)
Potassium: 4.5 mmol/L (ref 3.5–5.1)
SGOT(AST): 57 U/L — ABNORMAL HIGH (ref 15–37)
SODIUM: 130 mmol/L — AB (ref 136–145)
TOTAL PROTEIN: 8.1 g/dL (ref 6.4–8.2)

## 2013-09-25 LAB — URINALYSIS, COMPLETE
BILIRUBIN, UR: NEGATIVE
Bacteria: NONE SEEN
Blood: NEGATIVE
Glucose,UR: NEGATIVE mg/dL (ref 0–75)
Hyaline Cast: 2
Ketone: NEGATIVE
NITRITE: NEGATIVE
PH: 6 (ref 4.5–8.0)
Protein: NEGATIVE
SPECIFIC GRAVITY: 1.023 (ref 1.003–1.030)

## 2013-09-25 LAB — SEDIMENTATION RATE: Erythrocyte Sed Rate: 21 mm/hr — ABNORMAL HIGH (ref 0–20)

## 2013-09-25 LAB — TROPONIN I: TROPONIN-I: 0.03 ng/mL

## 2013-09-25 LAB — TSH: THYROID STIMULATING HORM: 2.93 u[IU]/mL

## 2013-09-26 LAB — URINE CULTURE

## 2013-09-30 LAB — CULTURE, BLOOD (SINGLE)

## 2014-03-04 ENCOUNTER — Encounter (INDEPENDENT_AMBULATORY_CARE_PROVIDER_SITE_OTHER): Payer: Self-pay

## 2014-03-04 ENCOUNTER — Ambulatory Visit (INDEPENDENT_AMBULATORY_CARE_PROVIDER_SITE_OTHER): Payer: Medicare Other | Admitting: Cardiovascular Disease

## 2014-03-04 ENCOUNTER — Encounter: Payer: Self-pay | Admitting: Cardiovascular Disease

## 2014-03-04 VITALS — BP 120/60 | HR 78 | Ht 74.0 in | Wt 235.8 lb

## 2014-03-04 DIAGNOSIS — I482 Chronic atrial fibrillation, unspecified: Secondary | ICD-10-CM

## 2014-03-04 DIAGNOSIS — E119 Type 2 diabetes mellitus without complications: Secondary | ICD-10-CM

## 2014-03-04 DIAGNOSIS — I5042 Chronic combined systolic (congestive) and diastolic (congestive) heart failure: Secondary | ICD-10-CM | POA: Diagnosis not present

## 2014-03-04 DIAGNOSIS — R2681 Unsteadiness on feet: Secondary | ICD-10-CM

## 2014-03-04 DIAGNOSIS — R0602 Shortness of breath: Secondary | ICD-10-CM | POA: Diagnosis not present

## 2014-03-04 DIAGNOSIS — Z95810 Presence of automatic (implantable) cardiac defibrillator: Secondary | ICD-10-CM | POA: Diagnosis not present

## 2014-03-04 NOTE — Patient Instructions (Signed)
You are doing well. No medication changes were made.  Please call us if you have new issues that need to be addressed before your next appt.  Your physician wants you to follow-up in: 6 months.  You will receive a reminder letter in the mail two months in advance. If you don't receive a letter, please call our office to schedule the follow-up appointment.   

## 2014-03-04 NOTE — Assessment & Plan Note (Signed)
We discussed his atrial fibrillation. He is a high fall risk. We will need to monitor him closely as he is on anticoagulation

## 2014-03-04 NOTE — Progress Notes (Signed)
Patient ID: Nicolas Morris, male    DOB: June 04, 1933, 81 y.o.   MRN: [id number]  HPI Comments: 81 year old gentleman with nonischemic cardiomyopathy, cardiac catheterization October 2010 showing no significant CAD, ejection fraction 45% at that time, with notes indicating ejection fraction less than 30% at [facility] at the end of 2013, atrial fibrillation/flutter, type 2 diabetes, previously seen at [facility], started on anticoagulation for atrial fibrillation several years ago, ICD placement December 2013, several cardiac catheterizations in 2014 one in January and one in May, by his report with several ICD shocks in October 2014,  admission to the hospital for bright red blood per rectum. Cardiology was consulted for management of his atrial fibrillation, anticoagulation, chest pain. Xarelto was held and recommendation was for him to hold this for at least 2 weeks.  He did have 3 units of packed red blood cells in the hospital for initial hemoglobin of 6.9. It was felt that he had demand ischemia in the setting of anemia with improvement after transfusion. Cardiac enzymes were negative during his hospital course.  He presents today for follow-up of his cardiomyopathy  In general he reports that he is doing well. Balance is poor, family who presents with him today reports that he does not do any exercise. History of falls, one fall last week hurt his right ribs He does not 1 use his walker. Legs get weak in the bathroom and he falls down When he walks the dog, he only opens the door but does not go walking with a dog He drinks significant suite T with real sugar, sugars have been running high at home Continues to have vertigo sometimes takes meclizine. Reports that he is been seen in the past by ENT, maneuvers did not work Continues to have chronic neck pain  EKG on today's visit shows paced rhythm with rate 78 bpm  Other past medical history Echocardiogram showed ejection fraction  45-50%. Inferior and posterior wall hypokinesis, ICD wire noted, mildly elevated right ventricular systolic pressures, mild MR, mildly dilated left atrium  CT abdomen and pelvis showed bilateral pleural effusions right greater than left, hepatic Steve tightness with early cirrhosis with ascites, mild splenomegaly, iron levels were extremely low at 26, iron saturation 6  EGD was performed that showed small hiatal hernia, esophagitis, erosions/gastritis, gastric varices, diverticulosis on colonoscopy with some internal hemorrhoids. He was evaluated by Dr. Marva Panda and started on PPI twice a day      Allergies  Allergen Reactions  . No Known Allergies     Outpatient Encounter Prescriptions as of 03/04/2014  Medication Sig  . acetaminophen (TYLENOL) 325 MG tablet Take 650 mg by mouth every 4 (four) hours as needed.  Marland Kitchen aspirin 81 MG tablet Take 81 mg by mouth daily.  . carvedilol (COREG) 6.25 MG tablet Take 2 tablets (12.5 mg total) by mouth 2 (two) times daily with a meal.  . digoxin (LANOXIN) 0.25 MG tablet Take 0.25 mg by mouth daily.  . furosemide (LASIX) 40 MG tablet Take 1 tablet (40 mg total) by mouth 2 (two) times daily.  Marland Kitchen gabapentin (NEURONTIN) 300 MG capsule Take 300 mg by mouth 3 (three) times daily.   Marland Kitchen lisinopril (PRINIVIL,ZESTRIL) 2.5 MG tablet Take 2.5 mg by mouth daily.  Marland Kitchen lovastatin (MEVACOR) 20 MG tablet Take 20 mg by mouth at bedtime.   . meclizine (ANTIVERT) 25 MG tablet Take 25 mg by mouth 3 (three) times daily as needed for dizziness.  . metFORMIN (GLUCOPHAGE) 500  MG tablet Take by mouth 2 (two) times daily with a meal.  . pantoprazole (PROTONIX) 40 MG tablet Take 40 mg by mouth daily.   . potassium chloride (MICRO-K) 10 MEQ CR capsule Take 20 mEq by mouth daily.   . Rivaroxaban (XARELTO) 20 MG TABS tablet Take 20 mg by mouth daily with supper.  . sertraline (ZOLOFT) 100 MG tablet Take 100 mg by mouth daily.  . traZODone (DESYREL) 50 MG tablet Take 200 mg by mouth at  bedtime.   . [DISCONTINUED] atorvastatin (LIPITOR) 20 MG tablet Take 20 mg by mouth daily.    Past Medical History  Diagnosis Date  . Non-obstructive CAD     a. s/p multiple caths @ [facility] - last reportedly 06/2012;  b. 06/2013 Lexi MV: EF 51%, no ischemia/infarct.  . MI (myocardial infarction)   . NICM (nonischemic cardiomyopathy)     a. EF reportedly < 30% 01/2013 ([facility]);  b. 01/2012 s/p MDT DTBB1D1 Viva S CRT-D;  c. 12/2012 Echo: EF 45-50%, inf/post HK, nl RV, mild MR.  . Diabetes mellitus without complication   . A-fib     a. Permanent, on Xarelto.  . H/O: GI bleed     a. 12/2012 EGD: HH, evidence of reflux. Erosive Gastritis. Nl duodenum;  b. 12/2012 Colonoscopy: Sev sigmoid and desc colon diverticulosis, transverse and ascending colon diverticulosis, nonbleeding internal hemorrhoids, no bleeding.  . Iron deficiency anemia   . Biventricular ICD (implantable cardioverter-defibrillator) -Medtronic 01/12/2013    a. 01/2012 [facility]:  01/2012 s/p MDT DTBB1D1 Viva S CRT-D.  Atrial port capped (afib).  . Diverticulosis     a. 12/2012 noted on colonoscopy.  . Hiatal hernia     a. 12/2012 noted on EGD.  Marland Kitchen Gastritis     a. 12/2012 noted on EGD.    Past Surgical History  Procedure Laterality Date  . Cardiac catheterization  2013    [facility]   . Cardiac catheterization  2011  . Defib implant  01/27/2012    Serial # [id number] H Model # [id number]  . Pacemaker insertion  01/27/2012  . Colonoscopy  32014  . Upper gastrointestinal endoscopy  2014  . Fetal blood transfusion  2014    Social History  reports that he quit smoking about 11 years ago. His smoking use included Cigarettes. He has a 135 pack-year smoking history. He does not have any smokeless tobacco history on file. He reports that he does not drink alcohol or use illicit drugs.  Family History Family history is unknown by patient.  Review of Systems  Constitutional: Negative.   Eyes: Negative.   Respiratory: Negative.    Cardiovascular: Negative.   Gastrointestinal: Negative.   Musculoskeletal: Positive for back pain and gait problem.  Skin: Negative.   Allergic/Immunologic: Negative.   Neurological: Positive for dizziness.  Hematological: Negative.   Psychiatric/Behavioral: Negative.   All other systems reviewed and are negative.   BP 120/60 mmHg  Pulse 78  Ht 6\' 2"  (1.88 m)  Wt 235 lb 12 oz (106.935 kg)  BMI 30.26 kg/m2  Physical Exam  Constitutional: He is oriented to person, place, and time. He appears well-developed and well-nourished.  Walks with a cane, unsteady gait  HENT:  Head: Normocephalic.  Nose: Nose normal.  Mouth/Throat: Oropharynx is clear and moist.  Eyes: Conjunctivae are normal. Pupils are equal, round, and reactive to light.  Neck: Normal range of motion. Neck supple. No JVD present.  Cardiovascular: Normal rate, regular rhythm, S1 normal, S2 normal and  intact distal pulses.  Exam reveals no gallop and no friction rub.   Murmur heard.  Systolic murmur is present with a grade of 2/6  Pulmonary/Chest: Effort normal and breath sounds normal. No respiratory distress. He has no wheezes. He has no rales. He exhibits no tenderness.  Abdominal: Soft. Bowel sounds are normal. He exhibits no distension. There is no tenderness.  Musculoskeletal: Normal range of motion. He exhibits no edema or tenderness.  Lymphadenopathy:    He has no cervical adenopathy.  Neurological: He is alert and oriented to person, place, and time. Coordination normal.  Skin: Skin is warm and dry. No rash noted. No erythema.  Psychiatric: He has a normal mood and affect. His behavior is normal. Judgment and thought content normal.      Assessment and Plan   Nursing note and vitals reviewed.

## 2014-03-04 NOTE — Assessment & Plan Note (Signed)
Followed by Dr. Klein, EP 

## 2014-03-04 NOTE — Assessment & Plan Note (Signed)
Talked with him and his daughter. Recommended he try a sugar substitute, avoid sugar. He drinks significant volume of ice tea, sweetened

## 2014-03-04 NOTE — Assessment & Plan Note (Signed)
Appears relatively euvolemic on today's visit. Encouraged him to stay on his Lasix. He did significant fluids. Weight is stable

## 2014-03-04 NOTE — Assessment & Plan Note (Signed)
Minimal activity, sedentary at home, leg weakness. Does not like to use his walker. Recent falls

## 2014-03-22 ENCOUNTER — Encounter: Payer: Self-pay | Admitting: Physician Assistant

## 2014-03-22 ENCOUNTER — Inpatient Hospital Stay: Payer: Self-pay | Admitting: Surgery

## 2014-03-22 DIAGNOSIS — I5022 Chronic systolic (congestive) heart failure: Secondary | ICD-10-CM | POA: Diagnosis not present

## 2014-03-22 DIAGNOSIS — I4891 Unspecified atrial fibrillation: Secondary | ICD-10-CM | POA: Diagnosis not present

## 2014-03-22 DIAGNOSIS — Z01810 Encounter for preprocedural cardiovascular examination: Secondary | ICD-10-CM

## 2014-03-22 LAB — COMPREHENSIVE METABOLIC PANEL
AST: 20 U/L (ref 15–37)
Albumin: 3.4 g/dL (ref 3.4–5.0)
Alkaline Phosphatase: 91 U/L (ref 46–116)
Anion Gap: 11 (ref 7–16)
BILIRUBIN TOTAL: 0.8 mg/dL (ref 0.2–1.0)
BUN: 13 mg/dL (ref 7–18)
CREATININE: 1.16 mg/dL (ref 0.60–1.30)
Calcium, Total: 9 mg/dL (ref 8.5–10.1)
Chloride: 100 mmol/L (ref 98–107)
Co2: 24 mmol/L (ref 21–32)
EGFR (Non-African Amer.): >60
GLUCOSE: 330 mg/dL — AB (ref 65–99)
OSMOLALITY: 283 (ref 275–301)
Potassium: 3.6 mmol/L (ref 3.5–5.1)
SGPT (ALT): 21 U/L (ref 14–63)
Sodium: 135 mmol/L — ABNORMAL LOW (ref 136–145)
Total Protein: 8 g/dL (ref 6.4–8.2)

## 2014-03-22 LAB — CBC
HCT: 28.5 % — ABNORMAL LOW (ref 40.0–52.0)
HGB: 7.9 g/dL — AB (ref 13.0–18.0)
MCH: 15.5 pg — ABNORMAL LOW (ref 26.0–34.0)
MCHC: 27.8 g/dL — ABNORMAL LOW (ref 32.0–36.0)
MCV: 56 fL — ABNORMAL LOW (ref 80–100)
PLATELETS: 300 10*3/uL (ref 150–440)
RBC: 5.1 10*6/uL (ref 4.40–5.90)
RDW: 20.2 % — ABNORMAL HIGH (ref 11.5–14.5)
WBC: 12.4 10*3/uL — AB (ref 3.8–10.6)

## 2014-03-22 LAB — PRO B NATRIURETIC PEPTIDE: B-TYPE NATIURETIC PEPTID: 1997 pg/mL — AB (ref 0–450)

## 2014-03-22 LAB — URINALYSIS, COMPLETE
BACTERIA: NONE SEEN
BILIRUBIN, UR: NEGATIVE
Blood: NEGATIVE
KETONE: NEGATIVE
LEUKOCYTE ESTERASE: NEGATIVE
Nitrite: NEGATIVE
PH: 6 (ref 4.5–8.0)
Protein: NEGATIVE
RBC,UR: NONE SEEN /HPF (ref 0–5)
Specific Gravity: 1.003 (ref 1.003–1.030)
WBC UR: <1 /HPF (ref 0–5)

## 2014-03-22 LAB — IRON AND TIBC
IRON SATURATION: 5 %
IRON: 22 ug/dL — AB (ref 65–175)
Iron Bind.Cap.(Total): 463 ug/dL — ABNORMAL HIGH (ref 250–450)
Unbound Iron-Bind.Cap.: 441 ug/dL

## 2014-03-22 LAB — TROPONIN I: TROPONIN-I: 0.05 ng/mL

## 2014-03-22 LAB — LIPASE, BLOOD: Lipase: 80 U/L (ref 73–393)

## 2014-03-22 LAB — FERRITIN: Ferritin (ARMC): 14 ng/mL (ref 8–388)

## 2014-03-23 LAB — CBC WITH DIFFERENTIAL/PLATELET
Basophil #: 0.1 10*3/uL (ref 0.0–0.1)
Basophil %: 0.6 %
EOS PCT: 1.3 %
Eosinophil #: 0.1 10*3/uL (ref 0.0–0.7)
HCT: 24.8 % — ABNORMAL LOW (ref 40.0–52.0)
HGB: 7 g/dL — AB (ref 13.0–18.0)
LYMPHS PCT: 16.6 %
Lymphocyte #: 1.8 10*3/uL (ref 1.0–3.6)
MCH: 15.6 pg — ABNORMAL LOW (ref 26.0–34.0)
MCHC: 28.4 g/dL — ABNORMAL LOW (ref 32.0–36.0)
MCV: 55 fL — AB (ref 80–100)
MONOS PCT: 10.5 %
Monocyte #: 1.2 x10 3/mm — ABNORMAL HIGH (ref 0.2–1.0)
NEUTROS ABS: 7.8 10*3/uL — AB (ref 1.4–6.5)
Neutrophil %: 71 %
Platelet: 256 10*3/uL (ref 150–440)
RBC: 4.5 10*6/uL (ref 4.40–5.90)
RDW: 20.4 % — ABNORMAL HIGH (ref 11.5–14.5)
WBC: 11 10*3/uL — ABNORMAL HIGH (ref 3.8–10.6)

## 2014-03-23 LAB — COMPREHENSIVE METABOLIC PANEL
ALT: 19 U/L (ref 14–63)
Albumin: 2.8 g/dL — ABNORMAL LOW (ref 3.4–5.0)
Alkaline Phosphatase: 77 U/L (ref 46–116)
Anion Gap: 11 (ref 7–16)
BILIRUBIN TOTAL: 0.9 mg/dL (ref 0.2–1.0)
BUN: 12 mg/dL (ref 7–18)
CHLORIDE: 100 mmol/L (ref 98–107)
CO2: 24 mmol/L (ref 21–32)
CREATININE: 1.18 mg/dL (ref 0.60–1.30)
Calcium, Total: 8.4 mg/dL — ABNORMAL LOW (ref 8.5–10.1)
EGFR (African American): >60
Glucose: 207 mg/dL — ABNORMAL HIGH (ref 65–99)
Osmolality: 276 (ref 275–301)
Potassium: 3.6 mmol/L (ref 3.5–5.1)
SGOT(AST): 28 U/L (ref 15–37)
Sodium: 135 mmol/L — ABNORMAL LOW (ref 136–145)
Total Protein: 7.1 g/dL (ref 6.4–8.2)

## 2014-03-24 LAB — BASIC METABOLIC PANEL
Anion Gap: 8 (ref 7–16)
BUN: 11 mg/dL (ref 7–18)
CREATININE: 1.03 mg/dL (ref 0.60–1.30)
Calcium, Total: 8.3 mg/dL — ABNORMAL LOW (ref 8.5–10.1)
Chloride: 101 mmol/L (ref 98–107)
Co2: 28 mmol/L (ref 21–32)
EGFR (African American): >60
Glucose: 216 mg/dL — ABNORMAL HIGH (ref 65–99)
Osmolality: 280 (ref 275–301)
POTASSIUM: 3.4 mmol/L — AB (ref 3.5–5.1)
Sodium: 137 mmol/L (ref 136–145)

## 2014-03-24 LAB — CBC WITH DIFFERENTIAL/PLATELET
BASOS ABS: 0.1 10*3/uL (ref 0.0–0.1)
Basophil %: 1.1 %
EOS ABS: 0.3 10*3/uL (ref 0.0–0.7)
Eosinophil %: 3.8 %
HCT: 26.6 % — AB (ref 40.0–52.0)
HGB: 7.7 g/dL — AB (ref 13.0–18.0)
LYMPHS PCT: 14.9 %
Lymphocyte #: 1.1 10*3/uL (ref 1.0–3.6)
MCH: 16.7 pg — ABNORMAL LOW (ref 26.0–34.0)
MCHC: 29.1 g/dL — ABNORMAL LOW (ref 32.0–36.0)
MCV: 57 fL — ABNORMAL LOW (ref 80–100)
MONOS PCT: 9.5 %
Monocyte #: 0.7 x10 3/mm (ref 0.2–1.0)
NEUTROS ABS: 5.1 10*3/uL (ref 1.4–6.5)
Neutrophil %: 70.7 %
Platelet: 226 10*3/uL (ref 150–440)
RBC: 4.64 10*6/uL (ref 4.40–5.90)
RDW: 21.3 % — AB (ref 11.5–14.5)
WBC: 7.2 10*3/uL (ref 3.8–10.6)

## 2014-03-25 LAB — CBC WITH DIFFERENTIAL/PLATELET
BASOS PCT: 1 %
Basophil #: 0.1 10*3/uL (ref 0.0–0.1)
Eosinophil #: 0.3 10*3/uL (ref 0.0–0.7)
Eosinophil %: 4.2 %
HCT: 26.6 % — AB (ref 40.0–52.0)
HGB: 7.8 g/dL — AB (ref 13.0–18.0)
LYMPHS PCT: 16.1 %
Lymphocyte #: 1.3 10*3/uL (ref 1.0–3.6)
MCH: 16.8 pg — ABNORMAL LOW (ref 26.0–34.0)
MCHC: 29.5 g/dL — ABNORMAL LOW (ref 32.0–36.0)
MCV: 57 fL — ABNORMAL LOW (ref 80–100)
MONOS PCT: 9.2 %
Monocyte #: 0.8 x10 3/mm (ref 0.2–1.0)
NEUTROS ABS: 5.8 10*3/uL (ref 1.4–6.5)
Neutrophil %: 69.5 %
Platelet: 234 10*3/uL (ref 150–440)
RBC: 4.65 10*6/uL (ref 4.40–5.90)
RDW: 21.7 % — ABNORMAL HIGH (ref 11.5–14.5)
WBC: 8.3 10*3/uL (ref 3.8–10.6)

## 2014-03-29 ENCOUNTER — Encounter: Payer: Medicare Other | Admitting: Internal Medicine

## 2014-04-19 ENCOUNTER — Telehealth: Payer: Self-pay | Admitting: Internal Medicine

## 2014-04-19 NOTE — Telephone Encounter (Signed)
Spoke w/ pt's wife.  She states that she was not aware that there was an issue w/ pt's meds, as his digoxin has not been changed.  Spoke w/ Dierdre @ pt's PCP's office and advised her of pt's digoxin dose.  She will update pt's med record and call back w/ any questions or concerns.

## 2014-04-19 NOTE — Telephone Encounter (Signed)
Clarify digoxin dosage for patient PCP

## 2014-04-20 ENCOUNTER — Encounter: Payer: Self-pay | Admitting: *Deleted

## 2014-05-28 LAB — BASIC METABOLIC PANEL
Anion Gap: 8 (ref 7–16)
BUN: 15 mg/dL
CHLORIDE: 104 mmol/L
CO2: 24 mmol/L
CREATININE: 0.9 mg/dL
Calcium, Total: 9.4 mg/dL
EGFR (Non-African Amer.): >60
GLUCOSE: 193 mg/dL — AB
Potassium: 4.8 mmol/L
Sodium: 136 mmol/L

## 2014-05-28 LAB — CBC
HCT: 29.2 % — ABNORMAL LOW (ref 40.0–52.0)
HGB: 8.3 g/dL — ABNORMAL LOW (ref 13.0–18.0)
MCH: 16.9 pg — AB (ref 26.0–34.0)
MCHC: 28.6 g/dL — ABNORMAL LOW (ref 32.0–36.0)
MCV: 59 fL — ABNORMAL LOW (ref 80–100)
Platelet: 306 10*3/uL (ref 150–440)
RBC: 4.93 10*6/uL (ref 4.40–5.90)
RDW: 22.5 % — ABNORMAL HIGH (ref 11.5–14.5)
WBC: 13.1 10*3/uL — ABNORMAL HIGH (ref 3.8–10.6)

## 2014-05-28 LAB — PRO B NATRIURETIC PEPTIDE: B-Type Natriuretic Peptide: 634 pg/mL — ABNORMAL HIGH

## 2014-05-28 LAB — TROPONIN I: Troponin-I: 0.05 ng/mL — ABNORMAL HIGH

## 2014-05-29 ENCOUNTER — Observation Stay
Admit: 2014-05-29 | Disposition: A | Discharge status: Home / Self Care | Payer: Self-pay | Attending: Internal Medicine | Admitting: Internal Medicine

## 2014-05-29 ENCOUNTER — Encounter: Payer: Self-pay | Admitting: Cardiovascular Disease

## 2014-05-29 DIAGNOSIS — I4891 Unspecified atrial fibrillation: Secondary | ICD-10-CM

## 2014-05-29 DIAGNOSIS — I5042 Chronic combined systolic (congestive) and diastolic (congestive) heart failure: Secondary | ICD-10-CM

## 2014-05-29 DIAGNOSIS — R627 Adult failure to thrive: Secondary | ICD-10-CM | POA: Diagnosis not present

## 2014-05-29 LAB — TROPONIN I
TROPONIN-I: 0.1 ng/mL — AB
Troponin-I: 0.09 ng/mL — ABNORMAL HIGH

## 2014-05-29 LAB — CK-MB
CK-MB: 2.7 ng/mL
CK-MB: 2.8 ng/mL
CK-MB: 3 ng/mL

## 2014-05-30 DIAGNOSIS — R0602 Shortness of breath: Secondary | ICD-10-CM

## 2014-05-30 DIAGNOSIS — I340 Nonrheumatic mitral (valve) insufficiency: Secondary | ICD-10-CM | POA: Diagnosis not present

## 2014-05-30 LAB — BASIC METABOLIC PANEL
ANION GAP: 8 (ref 7–16)
BUN: 14 mg/dL
CREATININE: 0.87 mg/dL
Calcium, Total: 9 mg/dL
Chloride: 105 mmol/L
Co2: 28 mmol/L
EGFR (Non-African Amer.): >60
Glucose: 144 mg/dL — ABNORMAL HIGH
Potassium: 4 mmol/L
Sodium: 141 mmol/L

## 2014-05-30 LAB — CBC WITH DIFFERENTIAL/PLATELET
BASOS ABS: 0.1 10*3/uL (ref 0.0–0.1)
BASOS PCT: 1.3 %
Eosinophil #: 0.2 10*3/uL (ref 0.0–0.7)
Eosinophil %: 2.2 %
HCT: 26.5 % — AB (ref 40.0–52.0)
HGB: 7.8 g/dL — ABNORMAL LOW (ref 13.0–18.0)
LYMPHS PCT: 21.9 %
Lymphocyte #: 1.5 10*3/uL (ref 1.0–3.6)
MCH: 17.4 pg — ABNORMAL LOW (ref 26.0–34.0)
MCHC: 29.2 g/dL — ABNORMAL LOW (ref 32.0–36.0)
MCV: 60 fL — ABNORMAL LOW (ref 80–100)
MONOS PCT: 9.9 %
Monocyte #: 0.7 x10 3/mm (ref 0.2–1.0)
Neutrophil #: 4.5 10*3/uL (ref 1.4–6.5)
Neutrophil %: 64.7 %
Platelet: 245 10*3/uL (ref 150–440)
RBC: 4.45 10*6/uL (ref 4.40–5.90)
RDW: 21.9 % — ABNORMAL HIGH (ref 11.5–14.5)
WBC: 7 10*3/uL (ref 3.8–10.6)

## 2014-05-30 LAB — LIPID PANEL
Cholesterol: 126 mg/dL
HDL Cholesterol: 25 mg/dL — ABNORMAL LOW
Ldl Cholesterol, Calc: 80 mg/dL
Triglycerides: 105 mg/dL
VLDL CHOLESTEROL, CALC: 21 mg/dL

## 2014-05-31 ENCOUNTER — Encounter: Payer: Self-pay | Admitting: Cardiovascular Disease

## 2014-05-31 ENCOUNTER — Telehealth: Payer: Self-pay

## 2014-05-31 DIAGNOSIS — I5042 Chronic combined systolic (congestive) and diastolic (congestive) heart failure: Secondary | ICD-10-CM | POA: Diagnosis not present

## 2014-05-31 LAB — BASIC METABOLIC PANEL
Anion Gap: 8 (ref 7–16)
BUN: 21 mg/dL — ABNORMAL HIGH
Calcium, Total: 8.6 mg/dL — ABNORMAL LOW
Chloride: 96 mmol/L — ABNORMAL LOW
Co2: 29 mmol/L
Creatinine: 1.26 mg/dL — ABNORMAL HIGH
EGFR (African American): >60
GFR CALC NON AF AMER: 54 — AB
Glucose: 154 mg/dL — ABNORMAL HIGH
Potassium: 3.8 mmol/L
SODIUM: 134 mmol/L — AB

## 2014-05-31 NOTE — Telephone Encounter (Signed)
Patient contacted regarding discharge from [facility] on 05/31/14.  Patient understands to follow up with Dr. Mariah Milling on 06/20/14 at 3:45 at [facility]. Patient understands discharge instructions? Yes Patient understands medications and regiment? Yes Patient understands to bring all medications to this visit? Yes

## 2014-06-03 ENCOUNTER — Emergency Department
Admit: 2014-06-03 | Disposition: A | Discharge status: Home / Self Care | Payer: Self-pay | Admitting: Emergency Medicine

## 2014-06-03 LAB — COMPREHENSIVE METABOLIC PANEL
ALBUMIN: 3.5 g/dL
ALK PHOS: 59 U/L
Anion Gap: 7 (ref 7–16)
BUN: 24 mg/dL — ABNORMAL HIGH
Bilirubin,Total: 0.8 mg/dL
CALCIUM: 8.5 mg/dL — AB
CO2: 23 mmol/L
Chloride: 100 mmol/L — ABNORMAL LOW
Creatinine: 1.12 mg/dL
EGFR (Non-African Amer.): >60
Glucose: 157 mg/dL — ABNORMAL HIGH
Potassium: 4 mmol/L
SGOT(AST): 22 U/L
SGPT (ALT): 13 U/L — ABNORMAL LOW
Sodium: 130 mmol/L — ABNORMAL LOW
Total Protein: 7.3 g/dL

## 2014-06-03 LAB — CBC
HCT: 25.8 % — AB (ref 40.0–52.0)
HGB: 7.4 g/dL — AB (ref 13.0–18.0)
MCH: 16.9 pg — ABNORMAL LOW (ref 26.0–34.0)
MCHC: 28.6 g/dL — ABNORMAL LOW (ref 32.0–36.0)
MCV: 59 fL — ABNORMAL LOW (ref 80–100)
Platelet: 262 10*3/uL (ref 150–440)
RBC: 4.36 10*6/uL — AB (ref 4.40–5.90)
RDW: 21.3 % — ABNORMAL HIGH (ref 11.5–14.5)
WBC: 13.7 10*3/uL — AB (ref 3.8–10.6)

## 2014-06-03 LAB — PROTIME-INR
INR: 2.4
PROTHROMBIN TIME: 26.4 s — AB

## 2014-06-03 LAB — APTT: Activated PTT: 48.9 secs — ABNORMAL HIGH (ref 23.6–35.9)

## 2014-06-03 LAB — CK TOTAL AND CKMB (NOT AT ARMC)
CK, Total: 34 U/L — ABNORMAL LOW
CK-MB: 0.9 ng/mL

## 2014-06-03 LAB — TROPONIN I: Troponin-I: 0.05 ng/mL — ABNORMAL HIGH

## 2014-06-03 LAB — PRO B NATRIURETIC PEPTIDE: B-TYPE NATIURETIC PEPTID: 706 pg/mL — AB

## 2014-06-10 NOTE — Consult Note (Signed)
Brief Consult Note: Diagnosis: heme positive stool, anemia.   Patient was seen by consultant.   Comments: Appreciate consult for 81 y/o caucasian man admitted for chest pain, for evaluation of significant anemia and heme positive stools.  History significant for MI, atrial fibrillation, Xarelto and ASA therapy. Has also been taking Aleve once or twice daily for quite some time. Reports seeing some black stools 2w ago, none since. Was found to be heme positive by ED physician without any gross melena or frank bleeding. States he has felt weak and fatigued for several weeks. Has persistent indigestion for which he has Omeprazole 20mg  po, but states he has not really been taking meds for it. Reports some umbilical discomfort. Denies vomitng, problems swallowing, loss of appetite/weight. No history of prior luminal evaluation. Is currently on Pantoprazole gtt. Received 2u prbc for hgb of 6.9. Do note significant microcytosis. Think last Xarelto dose was yesterday. Mild elevated troponin today at 0.8- has been having some intermittent chest tightness. This was improved after ntg administration in ED. States he is feeling better since admission. Abdominal exam with soft nondistended abdomen with tenderness to the RUQ and RLQ. No peritoneal signs Impression and plan: Anemia, heme positive stool, chronic NSAID use. Concerning for UGI bleed v. other. No history of EGD or colonoscopy. Will likely benefit from these- however will need to work with cardiology and will need to be off Xarelto. Will discuss further with Dr Marva Panda, and have advised him to avoid further NSAIDs.  Electronic Signatures: Vevelyn Pat H (NP)  (Signed [phone] 14:36)  Authored: Brief Consult Note   Last Updated: 12-Nov-14 14:36 by Keturah Barre (NP)

## 2014-06-10 NOTE — Consult Note (Signed)
PATIENT NAME:  Nicolas Morris, Nicolas Morris MR#:  [id number] DATE OF BIRTH:  09-09-1933  DATE OF CONSULTATION:  12/30/2012  CONSULTING PHYSICIAN:  Keturah Barre, NP  Appreciate consult for 81 year old Caucasian man admitted for chest pain for evaluation of significant anemia and heme-positive stools. History significant for MI, atrial fibrillation, Xarelto and aspirin therapy, has also been taking Aleve once or twice a day for quite some time. Reports seeing some black stools 2 weeks, ago none since. Was found to be heme-positive by ED physician without any gross melena or frank bleeding. States he has felt weak and fatigued for several weeks. Has persistent indigestion, for which he has omeprazole 20 mg p.o. prescribed but states he has really not been taking any medications for it. Reports some umbilical discomfort. Denies vomiting, problems swallowing, loss of appetite, weight loss. No prior history of luminal evaluation. Currently on a pantoprazole drip. Received 2 units of packed red blood cells today for a hemoglobin of 6.9 due to significant microcytosis. Think his last Xarelto dose was yesterday. Elevated troponin today at 0.08. Has been having some intermittent chest tightness. This was improved after nitroglycerin administration in the Emergency Department. States he is feeling better since admission.   PAST MEDICAL HISTORY: Diabetes, hypertension, CAD, MI (last 2013), history of 2 other MIs, questionable CHF, atrial fibrillation; pacemaker, automatic implantable cardiac defibrillator insertion (2013); cardiac catheterization x 2 (last December 2013).   ALLERGIES: NKDA.   FAMILY HISTORY: Significant for liver cancer in brother and father. Another brother with alcoholic liver disease. His daughter deceased at 88 due to leukemia.   SOCIAL HISTORY: Quit smoking 2004. No alcohol or illicits. Moved here from [place] 10 months ago.   HOME MEDICATIONS: ASA 81 mg p.o. daily, Aleve 220 p.r.n., meclizine  25 p.o. t.i.d., Tylenol 325 daily p.r.n., trazodone 50 two tabs at bedtime, Coreg 6.25 b.i.d., metformin 500 mg b.i.d., Xarelto 20 mg at bedtime, sertraline 100 mg p.o. daily, omeprazole 20 mg p.o. daily, digoxin 250 mcg p.o. daily, potassium 20 mEq p.o. daily, Lasix 40 mg p.o. daily.  REVIEW OF SYSTEMS: Ten systems reviewed, unremarkable other than some neck stiffness and what is written above.   LABORATORY DATA: Most recent labs: BNP 1747, glucose 188, BUN 26, creatinine 1.18, sodium 138, potassium 4, chloride 107, calcium 8.9, magnesium 1.9, total protein 7.6, albumin 3.2, total bilirubin 0.4, ALP 93, AST 32, ALT 24. CK enzymes 67 and 58 respectively, MB fractions 1.6 and 1.9 respectively, troponin 0.04 to 0.08 respectively. Digoxin 1.03. WBC 14, hemoglobin 6.9, hematocrit 24.2, platelet count 378, MCV 57, MCH 16, RDW 19. PT 16.6, INR 1.3.   PHYSICAL EXAMINATION: VITAL SIGNS: Most recent temperature 98.6, pulse 88, respiratory rate 18, blood pressure 139/66, SaO2 of 97% on room air.  GENERAL: Pleasant, comfortable-appearing man lying in bed in no acute distress.  HEENT: Normocephalic, atraumatic. Conjunctivae somewhat pale. Sclerae clear.  NECK: Supple. No thyromegaly, JVD.  CHEST: Respirations eupneic. Lungs CTAB.  CARDIAC: S1, S2. Rhythm is somewhat irregular. No gallop or murmur. No appreciable edema.  ABDOMEN: Flat. Bowel sounds x 4. Nondistended, nontender. No guarding, rigidity, peritoneal signs or other abnormalities.  EXTREMITIES: MAEW x 4. No clubbing or cyanosis. Sensation appears to be intact.  PSYCHIATRIC: Pleasant, calm, cooperative, somewhat forgetful at times.  NEUROLOGICAL: Alert, oriented x 3. Cranial nerves II through XII intact. Speech clear. No facial droop.  SKIN: Warm, dry, pink. No erythema, lesion or rash.   IMPRESSION AND PLAN: Anemia, heme-positive stool, chronic nonsteroidal antiinflammatory drugs and  anticoagulation therapy. Concerning for upper gastrointestinal  bleed versus other. No history of EGD or colonoscopy. Will likely benefit from these. Have discussed this with Dr. Marva Panda. We will plan for this tomorrow afternoon as clinically feasible.   These services were provided by Vevelyn Pat, MSN, [facility], in collaboration with Christena Deem, MD, with whom I have discussed this patient in full.   Thank you very much for this consult.   ____________________________ Keturah Barre, NP chl:jcm D: 12/30/2012 17:33:53 ET T: 12/30/2012 18:18:42 ET JOB#: [id number]  cc: Keturah Barre, NP, <Dictator> Eustaquio Maize Pegah Segel FNP ELECTRONICALLY SIGNED 01/25/2013 13:05

## 2014-06-10 NOTE — Consult Note (Signed)
Chief Complaint:  Subjective/Chief Complaint seen for GI bleeding.  stable overnight, no evidence of recurrent bleeding.  no nausea, relates abdominal pain epigastric and lower abdomen.   VITAL SIGNS/ANCILLARY NOTES: **Vital Signs.:   13-Nov-14 07:00  Vital Signs Type Routine  Temperature Temperature (F) 98.9  Celsius 37.1  Temperature Source oral  Pulse Pulse 76  Pulse source if not from Vital Sign Device per cardiac monitor  Respirations Respirations 19  Systolic BP Systolic BP 948  Diastolic BP (mmHg) Diastolic BP (mmHg) 51  Mean BP 78  Pulse Ox % Pulse Ox % 95  Oxygen Delivery 2L  Pulse Ox Heart Rate 74    10:00  Vital Signs Type Routine  Temperature Source oral  Pulse Pulse 84  Pulse source if not from Vital Sign Device per cardiac monitor  Respirations Respirations 36  Systolic BP Systolic BP 016  Diastolic BP (mmHg) Diastolic BP (mmHg) 54  Mean BP 75  Pulse Ox % Pulse Ox % 92  Oxygen Delivery 2L  Pulse Ox Heart Rate 86   Brief Assessment:  Cardiac Regular   Respiratory clear BS   Gastrointestinal details normal Soft  Nondistended  tender to palpation in the epigastrum and lower abdomen, no rebound, no masses   Lab Results: Routine Chem:  12-Nov-14 04:43   BUN  26  13-Nov-14 04:22   Result Comment TROPONIN - RESULTS VERIFIED BY REPEAT TESTING.  - PREV. C/ 12-30-12 _0  BY HP. AJO  Result(s) reported on 31 Dec 2012 at 04:49AM.  Magnesium, Serum 1.9 (1.8-2.4 THERAPEUTIC RANGE: 4-7 mg/dL TOXIC: > 10 mg/dL  -----------------------)  Glucose, Serum  135  BUN  23  Creatinine (comp) 1.12  Sodium, Serum 139  Potassium, Serum 3.8  Chloride, Serum 106  CO2, Serum 26  Calcium (Total), Serum 8.6  Anion Gap 7  Osmolality (calc) 283  eGFR (African American) >60  eGFR (Non-African American) >60 (eGFR values <33m/min/1.73 m2 may be an indication of chronic kidney disease (CKD). Calculated eGFR is useful in patients with stable renal function. The eGFR  calculation will not be reliable in acutely ill patients when serum creatinine is changing rapidly. It is not useful in  patients on dialysis. The eGFR calculation may not be applicable to patients at the low and high extremes of body sizes, pregnant women, and vegetarians.)  Cardiac:  13-Nov-14 04:22   Troponin I  0.07 (0.00-0.05 0.05 ng/mL or less: NEGATIVE  Repeat testing in 3-6 hrs  if clinically indicated. >0.05 ng/mL: POTENTIAL  MYOCARDIAL INJURY. Repeat  testing in 3-6 hrs if  clinically indicated. NOTE: An increase or decrease  of 30% or more on serial  testing suggests a  clinically important change)  Routine Coag:  13-Nov-14 04:22   Prothrombin  15.9  INR 1.3 (INR reference interval applies to patients on anticoagulant therapy. A single INR therapeutic range for coumarins is not optimal for all indications; however, the suggested range for most indications is 2.0 - 3.0. Exceptions to the INR Reference Range may include: Prosthetic heart valves, acute myocardial infarction, prevention of myocardial infarction, and combinations of aspirin and anticoagulant. The need for a higher or lower target INR must be assessed individually. Reference: The Pharmacology and Management of the Vitamin K  antagonists: the seventh ACCP Conference on Antithrombotic and Thrombolytic Therapy. CPVVZS.8270Sept:126 (3suppl): [id number] A HCT value >55% may artifactually increase the PT.  In one study,  the increase was an average of 25%. Reference:  "Effect on Routine and Special Coagulation Testing  Values of Citrate Anticoagulant Adjustment in Patients with High HCT Values." American Journal of Clinical Pathology 2006;126:400-405.)  Routine Hem:  12-Nov-14 04:43   Hemoglobin (CBC)  6.9    10:22   Hemoglobin (CBC) -    14:28   Hemoglobin (CBC)  7.9 (Result(s) reported on 30 Dec 2012 at 02:46PM.)    18:51   Hemoglobin (CBC)  8.9 (Result(s) reported on 30 Dec 2012 at 07:18PM.)  13-Nov-14  04:22   WBC (CBC)  11.5  RBC (CBC) 4.73  Hemoglobin (CBC)  8.9  Hematocrit (CBC)  29.1  Platelet Count (CBC) 294  MCV  62  MCH  18.8  MCHC  30.6  RDW  27.0  Neutrophil % 74.7  Lymphocyte % 12.9  Monocyte % 9.3  Eosinophil % 1.9  Basophil % 1.2  Neutrophil #  8.6  Lymphocyte # 1.5  Monocyte #  1.1  Eosinophil # 0.2  Basophil # 0.1 (Result(s) reported on 31 Dec 2012 at 04:54AM.)   Radiology Results: XRay:    12-Nov-14 05:19, Chest Portable Single View  Chest Portable Single View   REASON FOR EXAM:    Chest pain  COMMENTS:       PROCEDURE: DXR - DXR PORTABLE CHEST SINGLE VIEW  - Dec 30 2012  5:19AM     CLINICAL DATA:  Difficulty breathing, chest pain and diaphoresis.    EXAM:  PORTABLE CHEST - 1 VIEW    COMPARISON:  None available for comparison at time of study  interpretation.    FINDINGS:  Cardiac silhouette appears mildly enlarged, mediastinal silhouette  is nonsuspicious, mildly calcified aortic knob. Dual lead left  cardiac pacemaker with lead tips projecting in rightatrium and  right ventricle. Mild chronic interstitial changes and pulmonary  hyperexpansion without pleural effusions or focal consolidations. No  pneumothorax.    Multiple EKG lines overlie the patient and may obscure subtle  underlying pathology. Remote left clavicle head fracture. Soft  tissue planes and included osseous structures are nonsuspicious.     IMPRESSION:  Mild cardiomegaly and emphysematous changes without superimposed  acute pulmonary process.    Electronically Signed    By: Elon Alas    On: 12/30/2012 05:34         Verified By: Ricky Ala, M.D.,   Assessment/Plan:  Assessment/Plan:  Assessment 1) black stool and anemia in the setting of nsaid use while on asa and xarelto.  stable, no recurrent.   Plan 1) melena, anemia.  anemia is microcytic.  in the current setting will do egd re black stools however will need to have colonoscopy as well in regard  to the lower abdominal pain as well as the microcytic anemia.  I have discussed the risks benefits and complications of egd to include not limited to bleeding infection perforation and sedation and he wishes to proceed.  Further recs to follow.   Electronic Signatures: Loistine Simas (MD)  (Signed [phone] 11:10)  Authored: Chief Complaint, VITAL SIGNS/ANCILLARY NOTES, Brief Assessment, Lab Results, Radiology Results, Assessment/Plan   Last Updated: 13-Nov-14 11:10 by Loistine Simas (MD)

## 2014-06-10 NOTE — Consult Note (Signed)
Chief Complaint:  Subjective/Chief Complaint patient seen and examined, chart reviewed, please see full GI consult and brief consult note.  Patietn admitted with weakness adn finding of anemia in the setting of black stools for several weeks.  Paitent with significant anemia and has been transfused 3 unit prbc.  Feeling some better.  He reports epigastric pain and has been using aleve for his stomach pain and leg pain.  No previous h/o pudz, no h/o luminal evaluation in the past.  Case discussed with Dr Mariah Milling, who feels his chest pain is not related to a cardiac event.  Will arrange for egd tomorrow as clinically feasible to rule out upper gi bleeding source, possible colonoscopy the following day. I have discussed the risks benefits and complicatiosn of egd to include not limited to bleeding infection perforation and sedation and he wishes to proceed.   He has now been off xarelto about 24 hours.  Patietn with normal creatinine.  Following.   Electronic Signatures: Barnetta Chapel (MD)  (Signed [phone] 17:32)  Authored: Chief Complaint   Last Updated: 12-Nov-14 17:32 by Barnetta Chapel (MD)

## 2014-06-10 NOTE — Consult Note (Signed)
General Aspect Nicolas Morris is a 81yo Caucasian male w/ PMHx including CAD, s/p ICD, possible CHF/cardiomyopathy, atrial fibrillation/futter, type 2 DM. Patient reports he has received care at [facility] previously. Will obtain records to determine full medical history.   From a cardiac standpoint, he reports starting blood thinners for atrial fibrillation 2-3 years ago. He underwent ICD placement 01/28/2012 for a "weak heart." He has undergone cardiac catheterization twice this year [facility]) in the setting of MI. Patient's knowledge of these details are limited, but reports no prior PCI. He does indicate he has "blockages." He had an carotid u/s 3-4 months ago which "looked good." He had an ICD shock 3-4 weeks ago. Felt fine before and afterwards.   He was in his USOH until ~ 2-3 weeks ago when he began experiencing BRBPR which he noticed only upon wiping initially. More recently, he has experienced bright red blood, "like diarrhea" in the toilet bowl with associated abdominal pain. He continues to take Xarelto and ASA. Naproxen is listed on his home meds. No prior colonoscopies.   He awoke at 1AM today w/ substernal chest tightness, diaphoresis and shortness of breath which persisted for several hours. He went outside to try to catch his breath, and despite the cold weather, he was sweating profusely. EMS was called and he received full-dose ASA in addition to NTG SL with some relief. He was transported to [facility] ED.   Present Illness There, EKG revealed a V-paced rhythm with underlying atrial fibrillation. Initial TnI WNL. BNP 1747. LFTs albumin 3.2. CBC- Hgb 6.9/Hct 24.2. MCV 57. CXR- mild cardiomegaly and emphysematous changes without superimposed acute pulmonary process.  PAST MEDICAL HISTORY CAD Unspecified cardiomyopathy Atrial fibrillation/flutter  PAST SURGICAL HISTORY S/p ICD placement Previous cardiac catheterizations x 2  FAMILY HISTORY No significant cardiac history. Father and  brother with liver CA. Sister with breast CA. Daughter passed of leukemia at 87.   SOCIAL HISTORY Previous history of significant tobacco abuse 1.5 cartons per week at one point. Smoked for 50 years.   Physical Exam:  GEN no acute distress, obese   HEENT pale conjunctivae, PERRL, hearing intact to voice   NECK supple  No masses  trachea midline  no JVD   RESP normal resp effort  clear BS  no use of accessory muscles   CARD Irregular rate and rhythm  Normal, S1, S2  No murmur   ABD positive tenderness  soft  hyperactive BS   EXTR negative cyanosis/clubbing, negative edema   SKIN normal to palpation, skin turgor good, cap refill < 2 sec   NEURO follows commands, motor/sensory function intact   PSYCH alert, A+O to time, place, person   Review of Systems:  Subjective/Chief Complaint chest pain, shortness of breath, GI bleeding   General: Fatigue  chest tightness   Skin: No Complaints    ENT: No Complaints    Eyes: No Complaints    Neck: No Complaints    Respiratory: No Complaints  Short of breath   Cardiovascular: Tightness  Dyspnea   Gastrointestinal: Rectal Bleeding   Genitourinary: No Complaints    Vascular: No Complaints    Musculoskeletal: No Complaints    Neurologic: No Complaints    Hematologic: No Complaints    Endocrine: No Complaints    Psychiatric: No Complaints    Review of Systems: All other systems were reviewed and found to be negative   Medications/Allergies Reviewed Medications/Allergies reviewed    Home Medications: Medication Instructions Status  Aleve sodium 220  mg oral tablet  orally 2 times a day, As Needed - for Pain Active  meclizine 25 mg oral tablet 1 tab(s) orally 3 times a day, As Needed Active  acetaminophen 325 mg oral tablet  orally 3 times a day, As Needed - for Pain Active  traZODone 50 mg oral tablet 2 tab(s) orally once a day (at bedtime) Active  carvedilol 6.25 mg oral tablet 1 tab(s) orally 2 times a day Active   metFORMIN 500 mg oral tablet 1 tab(s) orally 2 times a day Active  Xarelto 20 mg oral tablet 1 tab(s) orally once a day (in the evening) Active  sertraline 100 mg oral tablet 1 tab(s) orally once a day Active  aspirin 81 mg oral tablet, chewable 1 tab(s) orally once a day Active  omeprazole 20 mg oral delayed release tablet 1 tab(s) orally once a day Active  digoxin 250 mcg (0.25 mg) oral tablet 1 tab(s) orally once a day Active  potassium chloride 20 mEq oral tablet, extended release  orally once a day Active  furosemide 40 mg oral tablet 1 tab(s) orally 2 times a day Active   Lab Results:  Hepatic:  12-Nov-14 04:43   Bilirubin, Total 0.4  Alkaline Phosphatase 93  SGPT (ALT) 24  SGOT (AST) 32  Total Protein, Serum 7.6  Albumin, Serum  3.2  Routine BB:  12-Nov-14 06:12   Crossmatch Unit 1 -  Crossmatch Unit 1 Issued  Crossmatch Unit 2 Ready (Result(s) reported on 30 Dec 2012 at 08:32AM.)  ABO Group + Rh Type A Positive  Antibody Screen NEGATIVE (Result(s) reported on 30 Dec 2012 at 07:17AM.)  Routine Chem:  12-Nov-14 04:43   B-Type Natriuretic Peptide [facility])  1747 (Result(s) reported on 30 Dec 2012 at 05:12AM.)  Glucose, Serum  188  BUN  26  Creatinine (comp) 1.18  Sodium, Serum 138  Potassium, Serum 4.0  Chloride, Serum 107  CO2, Serum 27  Calcium (Total), Serum 8.9  Osmolality (calc) 285  eGFR (African American) >60  eGFR (Non-African American)  59 (eGFR values <15m/min/1.73 m2 may be an indication of chronic kidney disease (CKD). Calculated eGFR is useful in patients with stable renal function. The eGFR calculation will not be reliable in acutely ill patients when serum creatinine is changing rapidly. It is not useful in  patients on dialysis. The eGFR calculation may not be applicable to patients at the low and high extremes of body sizes, pregnant women, and vegetarians.)  Anion Gap  4    06:12   Result Comment XMATCH - DUPLICATE ORDER  Result(s) reported on  30 Dec 2012 at 07:38AM.  Cardiac:  12-Nov-14 04:43   CK, Total 67  CPK-MB, Serum 1.6 (Result(s) reported on 30 Dec 2012 at 05:12AM.)  Troponin I 0.04 (0.00-0.05 0.05 ng/mL or less: NEGATIVE  Repeat testing in 3-6 hrs  if clinically indicated. >0.05 ng/mL: POTENTIAL  MYOCARDIAL INJURY. Repeat  testing in 3-6 hrs if  clinically indicated. NOTE: An increase or decrease  of 30% or more on serial  testing suggests a  clinically important change)  Routine Hem:  12-Nov-14 04:43   Hemoglobin (CBC)  6.9  WBC (CBC)  14.0  RBC (CBC)  4.29  Hematocrit (CBC)  24.2  Platelet Count (CBC) 378  MCV  57  MCH  16.0  MCHC  28.3  RDW  19.0  Neutrophil % 72.9  Lymphocyte % 17.2  Monocyte % 7.8  Eosinophil % 1.1  Basophil % 1.0  Neutrophil #  10.2  Lymphocyte # 2.4  Monocyte #  1.1  Eosinophil # 0.2  Basophil # 0.1 (Result(s) reported on 30 Dec 2012 at 05:05AM.)   EKG:  Interpretation EKG shows V-paced rhythm, LAD   Rate 93   EKG Comparision no prior tracings for comparison   Radiology Results: XRay:    12-Nov-14 05:19, Chest Portable Single View  Chest Portable Single View   REASON FOR EXAM:    Chest pain  COMMENTS:       PROCEDURE: DXR - DXR PORTABLE CHEST SINGLE VIEW  - Dec 30 2012  5:19AM     CLINICAL DATA:  Difficulty breathing, chest pain and diaphoresis.    EXAM:  PORTABLE CHEST - 1 VIEW    COMPARISON:  None available for comparison at time of study  interpretation.    FINDINGS:  Cardiac silhouette appears mildly enlarged, mediastinal silhouette  is nonsuspicious, mildly calcified aortic knob. Dual lead left  cardiac pacemaker with lead tips projecting in rightatrium and  right ventricle. Mild chronic interstitial changes and pulmonary  hyperexpansion without pleural effusions or focal consolidations. No  pneumothorax.    Multiple EKG lines overlie the patient and may obscure subtle  underlying pathology. Remote left clavicle head fracture. Soft  tissue  planes and included osseous structures are nonsuspicious.     IMPRESSION:  Mild cardiomegaly and emphysematous changes without superimposed  acute pulmonary process.    Electronically Signed    By: Elon Alas    On: 12/30/2012 05:34         Verified By: Ricky Ala, M.D.,    No Known Allergies:   Vital Signs/Nurse's Notes: **Vital Signs.:   12-Nov-14 08:53  Vital Signs Type 15 min Post Blood Start Time  Temperature Temperature (F) 98.2  Celsius 36.7  Temperature Source oral  Pulse Pulse 80  Pulse source if not from Vital Sign Device per cardiac monitor  Respirations Respirations 33  Systolic BP Systolic BP 366  Diastolic BP (mmHg) Diastolic BP (mmHg) 66  Mean BP 90  BP Source  if not from Vital Sign Device non-invasive  Pulse Ox % Pulse Ox % 97    Impression 81yo Caucasian male w/ PMHx including CAD, s/p ICD, possible CHF/cardiomyopathy, atrial fibrillation/futter, type 2 DM. Cardiology consulted for chest pain.   1. Chest pain significant cardiac history  He takes ASA, Xarelto and naproxen. EKG is V-paced. Initial set of cardiac biomarkers WNL  two prior cardiac catheterizations this year. He did not receive PCI, but patient did endorse "blockages." Increased cardiac demand from anemia likely accounts for his symptoms at this point. Given GIB and anticoagulation, would not proceed with cardiac cath at this time.  -- Obtain medical records from [facility] -- Cycle cardiac biomarkers. Observe trend and symptoms after transfusion.  -- Check 2D echo -- Hold Xarelto and ASA --  PRBC x 2 -- NTG, morphine for chest tightness -- Check lipid panel, Hgb A1C for risk stratification. Should be on a statin with prior MI. No med allergies.   2. Atrial fibrillation/flutter The patient has been on anticoagulation x 2-3 years for this. EKG indicates V-pacing with underlying a-fib. CHADSVASc high (at least 4- age > 24, CAD, DM2- given what we know presently). GIB on  Xarelto. Rate-controlled currently.  -- Hold anticoagulation -- Restart home dose carvedilol  3. GIB with resultant anemia Hgb 6.9 this AM. Receiving PRBC x 2. Increased cardiac demand likely accounting for symptoms. No prior colonoscopy.  -- Monitor cardiac enzyme trend.  If WNL or mild plateau, suspect demand over MI.  -- GI consult for consideration of colonoscopy.   Plan . 4. Unspecified cardiomyopathy s/p ICD Patient reports undergoing ICD therapy last year prior to cardiac catheterization. ? nonischemic cardiomyopathy. ICD shock 3-4 weeks ago. No arrhythmic type symptoms at this time. BNP mildly elevated in the ED likely due to anemia->cardiac demand. No evidence of volume overload on exam.  -- Obtain records from [facility] re: type of device, indication for implant, type of cardiomyopathy, etc -- Review 2D echo -- Restart carvedilol. If EF < 45%, consider adding ACEi/ARB. -- Restart home dose Lasix with PRBC transfusion to avoid volume overload   Electronic Signatures: Meriel Pica (PA-C)  (Signed 12-Nov-14 09:48)  Authored: General Aspect/Present Illness, History and Physical Exam, Review of System, Home Medications, Labs, EKG , Radiology, Allergies, Vital Signs/Nurse's Notes, Impression/Plan Ida Rogue (MD)  (Signed [phone] 13:40)  Authored: Review of System, EKG , Impression/Plan  Co-Signer: General Aspect/Present Illness, History and Physical Exam, Review of System, Home Medications, Labs, EKG , Radiology, Allergies, Vital Signs/Nurse's Notes, Impression/Plan   Last Updated: 12-Nov-14 13:40 by Ida Rogue (MD)

## 2014-06-10 NOTE — Discharge Summary (Signed)
PATIENT NAME:  Nicolas Morris, Nicolas Morris MR#:  [id number] DATE OF BIRTH:  1933-03-22  DATE OF ADMISSION:  12/30/2012 DATE OF DISCHARGE:  01/05/2013  DISCHARGE DIAGNOSES: 1.  Chest pain secondary to anemia.  2.  Anemia secondary isolated gastric (varices  in the setting of cirrhosis and iron deficiency anemia. The patient also has erosive gastritis.  3.  Elevated troponins secondary to demand ischemia from anemia.  4.  History of coronary artery disease, status post percutaneous coronary intervention.   5.  Chronic atrial fibrillation.   CONSULTATIONS:  GI consult with Dr. Marva Panda, cardiology consult with Dr. Dossie Arbour.    DISCHARGE MEDICATIONS:   1.  Metformin 500 mg p.o. b.i.d. 2.  Coreg 6.w5 mg p.o. b.i.d. 3.  Trazodone 50 mg p.o. 2 tablets once a day.  4.  Meclizine 25 mg p.o. t.i.d. as needed.  5.  Digoxin.0.25 mg once a day.  6.  Zoloft 100 mg p.o. daily.  7.  KCl 20 mEq p.o. daily.  8.  Lasix 40 mg p.o. b.i.d.  9.  Protonix 40 mg p.o. b.i.d.  10.  The patient is advised to stop Xarelto and aspirin for 2 weeks.   DIET: Low-sodium, ADA diet.   FOLLOWUP:  1.  With Dr. Dossie Arbour or his cardiologist in 2 weeks.  2.  Follow up with Dr. Marva Panda in 1 to 2 weeks.   HOSPITAL COURSE: This is a 81 year old male patient with history of hypertension, diabetes, came in because of the chest pain. The patient is a poor historian, unable to remember his cardiologist's name and also primary doctor's name, came in because of chest pain. Look at the history and physical for full details. First troponins were 0.04. The patient's hemoglobin was 6.9 on admission. Lab data showed LFTs within normal range. Chest x-ray showed cardiomegaly with emphysema. EKG showed paced rhythm at 93 beats per minute. Chest pain thought to be secondary to demand ischemia from anemia. The patient had not gotten any anticoagulation secondary to GI bleed. Seen by cardiology and the patient had an echocardiogram. He was taking Xarelto  for A. fib but that was stopped because of GI bleed. The patient has been on Xarelto for a long time because of A. fib.  EKG showed electronic ventricular pacemaker upon admission with 93 beats per minute.  BNP was slightly up at 1747 on admission. The patient's echocardiogram showed EF of 45% to 50% with decreased LV function. The patient was seen by cardiology on a regular basis and they thought increased troponins are due to demand ischemia due to anemia and no further cardiac intervention was suggested. Troponin plateaued at 0.07. The patient is on beta blockers and also statins, no anticoagulation at this time. The plan is for him to follow up with Dr. Dossie Arbour versus his cardiologist in 2 weeks to restart Xarelto or not.  Regarding his anemia, the patient's hemoglobin was 6.9 on admission and he received 2 units of transfusion. Hemoglobin stayed stable around 8.9. The patient had an EGD and colonoscopy. He also had a CT of abdomen with pelvis. CT abdomen and pelvis showed bilateral pleural effusion on the right greater than the left and also showed no acute findings and hepatic steatosis with early cirrhosis are present with ascites. The patient also had mild splenomegaly and pleural effusion. He had iron studies done. Iron levels were 26, iron saturation was 6 and iron-binding capacity was high at 455 suggesting he has iron deficiency anemia. The patient's EGD showed a  small hiatal hernia, evidence of reflux with linear pale plaquing with esophagitis, patchy moderate inflammation with erosions and erythema in the gastric antrum and also gastric varicosities are noted in the anterior and inferior gastric body without evidence of any bleeding. The patient also had a colonoscopy yesterday.  It showed only diverticulosis, no diverticulitis. The patient also has some internal hemorrhoids. Patient's condition was discussed with Dr. Marva Panda, normal GI bleeding. The patient does have iron deficiency anemia. He  suggested that the patient should be off aspirin and Xarelto for about 2 weeks and also follow up with him as an outpatient. He suggested that he needs to be on PPIs twice a day and I wrote for a prescription for Protonix.   Regarding his heart failure, the patient did receive fluids for his GI bleed but he had some shortness of breath today so Dr. Mariah Milling ordered 1 dose of IV Lasix. His blood pressure is stable and sats are 95% on room air. He can continue his home dose Lasix.    For his diabetes, he can continue metformin. We can add small dose ACE inhibitors, lisinopril 5 mg daily, and most recent kidney function is with normal creatinine of 1.03 and GFR of more than 60.  Again, he needs to stop aspirin and Xarelto for 2 weeks and continue his Coreg, digoxin and Lasix and lisinopril for his heart failure.     TIME SPENT ON DISCHARGE PREPARATION: More than 30 minutes.     ____________________________ Katha Hamming, MD sk:cs D: 01/05/2013 13:46:00 ET T: 01/05/2013 14:22:58 ET JOB#: [id number]  cc: Katha Hamming, MD, <Dictator> Katha Hamming MD ELECTRONICALLY SIGNED 01/18/2013 13:01

## 2014-06-10 NOTE — Consult Note (Signed)
Chief Complaint:  Subjective/Chief Complaint GI bleed/anemia.  stable over the weekend.  Denies n/v.  Has mild epigastric pain/discomfort, lower abdominalpain better. No evidence of ongoing bleeding.  Tolerated prep for colonoscopy.   VITAL SIGNS/ANCILLARY NOTES: **Vital Signs.:   17-Nov-14 10:25  Telemetry pattern Cardiac Rhythm Paced rhythm; pattern reported by Telemetry Clerk    14:01  Vital Signs Type Routine  Temperature Temperature (F) 97.4  Celsius 36.3  Temperature Source oral  Pulse Pulse 81  Respirations Respirations 20  Systolic BP Systolic BP 287  Diastolic BP (mmHg) Diastolic BP (mmHg) 98  Mean BP 123  Pulse Ox % Pulse Ox % 99  Pulse Ox Activity Level  At rest  Oxygen Delivery Room Air/ 21 %   Brief Assessment:  Cardiac Regular   Respiratory clear BS   Gastrointestinal details normal Soft  Nondistended  No masses palpable  Bowel sounds normal   Lab Results:  Routine Chem:  14-Nov-14 04:47   Glucose, Serum  125  BUN 18  Creatinine (comp) 1.03  Sodium, Serum 139  Potassium, Serum 3.5  Chloride, Serum 107  CO2, Serum 27  Calcium (Total), Serum  7.9  Anion Gap  5  Osmolality (calc) 281  eGFR (African American) >60  eGFR (Non-African American) >60 (eGFR values <73m/min/1.73 m2 may be an indication of chronic kidney disease (CKD). Calculated eGFR is useful in patients with stable renal function. The eGFR calculation will not be reliable in acutely ill patients when serum creatinine is changing rapidly. It is not useful in  patients on dialysis. The eGFR calculation may not be applicable to patients at the low and high extremes of body sizes, pregnant women, and vegetarians.)  Routine Coag:  13-Nov-14 04:22   INR 1.3 (INR reference interval applies to patients on anticoagulant therapy. A single INR therapeutic range for coumarins is not optimal for all indications; however, the suggested range for most indications is 2.0 - 3.0. Exceptions to the INR  Reference Range may include: Prosthetic heart valves, acute myocardial infarction, prevention of myocardial infarction, and combinations of aspirin and anticoagulant. The need for a higher or lower target INR must be assessed individually. Reference: The Pharmacology and Management of the Vitamin K  antagonists: the seventh ACCP Conference on Antithrombotic and Thrombolytic Therapy. CGOTLX.7262Sept:126 (3suppl): [id number] A HCT value >55% may artifactually increase the PT.  In one study,  the increase was an average of 25%. Reference:  "Effect on Routine and Special Coagulation Testing Values of Citrate Anticoagulant Adjustment in Patients with High HCT Values." American Journal of Clinical Pathology 2006;126:400-405.)  Routine Hem:  14-Nov-14 04:47   WBC (CBC) 9.4  RBC (CBC)  4.38  Hemoglobin (CBC)  8.5  Hematocrit (CBC)  27.4  Platelet Count (CBC) 252  MCV  63  MCH  19.4  MCHC  31.0  RDW  27.2  Neutrophil % 70.1  Lymphocyte % 15.9  Monocyte % 9.9  Eosinophil % 3.5  Basophil % 0.6  Neutrophil #  6.6  Lymphocyte # 1.5  Monocyte # 0.9  Eosinophil # 0.3  Basophil # 0.1 (Result(s) reported on 01 Jan 2013 at 05:26AM.)   Radiology Results: CT:    14-Nov-14 09:31, CT Abdomen and Pelvis With Contrast  CT Abdomen and Pelvis With Contrast   REASON FOR EXAM:    (1) abnormal vascular lesion anterior gastric   wall/varicosities; (2) abnormal va  COMMENTS:       PROCEDURE: CT  - CT ABDOMEN / PELVIS  W  -  Jan 01 2013  9:31AM     CLINICAL DATA:  Status post EGD.  Varices suspected.    EXAM:  CT ABDOMEN AND PELVIS WITH CONTRAST    TECHNIQUE:  Multidetector CT imaging of the abdomen and pelvis was performed  using the standard protocol following bolus administration of  intravenous contrast.  CONTRAST:  125 cc of Isovue    COMPARISON:  None.    FINDINGS:  Bilateral pleural effusions are noted right greater than left.    There is moderate diffuse low attenuation throughout  the liver  parenchyma consistent with fatty infiltration. The liver has a  slightly irregular contour . There may bemild atrophy of the right  hepatic lobe with increase size of the lateral segment of left  hepatic lobe and caudate lobe. The gallbladder wall appears mildly  edematous. No biliary dilatation. Normal appearance of the pancreas.  The spleen measures 13 cm in cranial caudal dimension. The volume of  the spleen is equal to 590 cubic cm.  The adrenal glands both appear normal. Cyst within the left kidney  is identified measuring 2.4 cm, image 48/series 2 smaller cyst is  identified in the right kidney measuring 1.3 cm. Urinary bladder  appears normal. Prostate gland and seminal vesicles are  unremarkable. There is calcified atherosclerotic disease affecting  the abdominal aorta. Gastrohepatic ligament lymph node is upper  limits of normal measuring 1 cm. No pelvic or inguinal adenopathy  noted.    No obvious distal esophageal varices identified. The stomach appears  normal. The small bowel loops have a normal caliber. The appendix is  visualized and appears normal.    Multiple distal colonic diverticula are identified without acute  inflammation. There is moderate ascites identified within the upper  abdomen and pelvis.    Review of the visualized osseous structures is significant for mild  multilevel lumbar spondylosis. .     IMPRESSION:  1. No acute findings identified.  2. Hepatic steatosis and possible early changes of cirrhosis.  3. Ascites.  4. Gallbladder prominence which is nonspecific in the setting of  ascites.  5. Splenomegaly.  6. Pleural effusions.    Electronically Signed    By: Kerby Moors M.D.    On: 01/01/2013 10:10         Verified By: Angelita Ingles, M.D.,   Assessment/Plan:  Assessment/Plan:  Assessment 1) anemia, black stools.  Erosive gastritis on EGD last week.  Microcytic anemia. tolerated prep for colonoscopy.   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   Plan 1) colonoscopy today, further recs to follow. I have discussed the risks benefits and complicatiosn of colonoscopy to include not limited to bleeding infection perforation and sedation and he wishes to proceed. 2) recommend fu GI outpatient clinic in 2 weeks or so for further evaluation of possible liver disease.   Electronic Signatures: Loistine Simas (MD)  (Signed [phone] 14:31)  Authored: Chief Complaint, VITAL SIGNS/ANCILLARY NOTES, Brief Assessment, Lab Results, Radiology Results, Assessment/Plan   Last Updated: 17-Nov-14 14:31 by Loistine Simas (MD)

## 2014-06-10 NOTE — Consult Note (Signed)
Chief Complaint:  Subjective/Chief Complaint Please see full EGD report.  Recommend continue protonix 40 mg iv bid for now, will obtain ct abdomen and pelvis with contrast, further evaluation of gastric varicosities (no apparent varices-gastric duodenal or esophageal), clears no erd or carbonation.  Colonsocopy when clinically feasible.  Discussed with Dr Berlinda Last.  Dr Shelle Iron will follow tomorrow and over the weekend.   Electronic Signatures: Barnetta Chapel (MD)  (Signed [phone] 14:47)  Authored: Chief Complaint   Last Updated: 13-Nov-14 14:47 by Barnetta Chapel (MD)

## 2014-06-10 NOTE — H&P (Signed)
PATIENT NAME:  Nicolas Morris, Nicolas Morris MR#:  [id number] DATE OF BIRTH:  [birth year]  DATE OF ADMISSION:  12/30/2012  REFERRING PHYSICIAN: Enedina Finner. Manson Passey, MD  PRIMARY CARE PHYSICIAN: The patient cannot recall his primary care physician. Reports he saw recently someone here in [place] last month, but reports he is still following with cardiology at [facility], where he is having regular followup every 3 to 6 months.   CHIEF COMPLAINT: Chest pain.   HISTORY OF PRESENT ILLNESS: This is a 81 year old man who is an extremely poor historian, who presents with complaint of chest pain. This is the patient's first visit to [facility], so I do not have any previous records on him. The patient reports he presents with chest pain that started this morning around 1:00 a.m. while he was ambulating at home. Reports chest pain did not improve, so he had to call EMS around 3:00 a.m. As well, the patient reports his chest pain was accompanied by some mild shortness of breath. Denies any nausea, vomiting, diaphoresis, palpitations or weakness. Complains of some dizziness as well. The patient reports he was, in December 2013, at [facility], where he had acute myocardial infarction, where he had a cardiac catheterization done x2. He does not recall what are the findings then. He reports as well he had AICD and pacemaker inserted. Then, he recalls, as he remembers, they might have told him he has a weak side of the heart, and maybe his heart was fluttering, as per his wording, and he is not sure if he is having atrial fibrillation or atrial flutter, but it seems that he is on Xarelto, aspirin. As well, he reports he has been having some tension headache for the last few weeks, where he has been having Aleve on a daily basis. The patient's first troponin was 0.04. The patient was given 324 of aspirin by ED. The patient's EKG was showing paced rhythm. The patient had basic workup done which did show him to have hemoglobin of  6.9. The patient is unaware of any history of anemia he has. The patient was Hemoccult positive in ED, but did not have frank melena or any bright red blood per rectum. Reports when he wiped a couple days ago, he noticed a couple streaks of blood, but he reports he has history of hemorrhoids, and there is no recurrence over the last few days. The patient was started on Protonix drip. The patient reports his shortness of breath and chest pain much improved after receiving sublingual nitro and nitro paste and morphine in ED. Hospitalist service was requested to admit the patient for further management and workup for his chest pain and GI bleed.   PAST MEDICAL HISTORY: As per the patient:  1. Diabetes mellitus.  2. Hypertension.  3. Coronary artery disease with MI in December 2013. Reports another episode of MI in [place] before 4 years.  4. Questionable history of congestive heart failure and atrial flutter versus atrial fibrillation, await documentation from [facility].   SURGERIES:  1. Pacemaker/defibrillator insertion in December 2013.  2. Cardiac catheterization x2 in December 2013, one from the groin, one from the arm, as per the patient.   ALLERGIES: No known drug allergies.   FAMILY HISTORY: The patient denies any family history of coronary artery disease at young age.   SOCIAL HISTORY: The patient quit smoking 10 years ago. No alcohol. No illicit drug use. Recently moved from [place], been here for 10 months. Recently moved to [place] area, been  here almost for a month now.   HOME MEDICATIONS:  1. Aspirin 81 mg oral daily.  2. Aleve 220 mg as needed. The patient reports he has been taking at least once a day.  3. Meclizine 25 mg oral as needed 3 times a day.  4. Tylenol 325 mg oral as needed.  5. Trazodone 50 mg oral 2 tablets at bedtime.  6. Coreg 6.25 mg oral twice a day.  7. Metformin 500 mg oral 2 times a day.  8. Xarelto 20 mg oral at bedtime.  9. Sertraline  100 mg oral daily.  10. Omeprazole 20 mg oral daily.  11. Digoxin 250 mcg oral daily.  12. Potassium 20 mEq oral daily.  13. Lasix 40 mg oral daily.   REVIEW OF SYSTEMS:  CONSTITUTIONAL: Denies fever, chills. Complains of fatigue, weakness. Denies weight gain, weight loss.  EYES: Denies blurry vision, double vision, inflammation, glaucoma.  ENT: Denies tinnitus, ear pain, hearing loss.  RESPIRATORY: Denies cough, wheezing, hemoptysis. Complains of shortness of breath.  CARDIOVASCULAR: Complains of chest pain, much improved. Denies edema, palpitations, syncope. Complains of dizziness.  GASTROINTESTINAL: Denies nausea, vomiting, diarrhea, abdominal pain, hematemesis, melena, rectal bleed.  GENITOURINARY: Denies dysuria, hematuria or renal colic.  ENDOCRINE: Denies polyuria, polydipsia, heat or cold intolerance.  HEMATOLOGY: Denies anemia. Reports easy bruising.  INTEGUMENTARY: Denies acne, rash or skin lesions.  MUSCULOSKELETAL: Denies any arthritis, cramps, swelling or gout.  NEUROLOGIC: Denies CVA, TIA, tremors. Complains of tension headaches for a few months now for which he is taking Aleve. Complains of dizziness. PSYCHIATRIC: Denies anxiety, insomnia. Reports some depression.   PHYSICAL EXAMINATION:  VITAL SIGNS: Temperature 99.2, pulse 86, respiratory rate 18, blood pressure 154/88, saturating 100% on 2 liters nasal cannula.  GENERAL: Obese male, looks comfortable in bed, in no apparent distress.  HEENT: Head atraumatic, normocephalic. Pupils equally reactive to light. Pink conjunctivae. Anicteric sclerae. Moist oral mucosa.  NECK: Supple. No thyromegaly. No JVD.  CHEST: Good air entry bilaterally. No wheezing, rales, rhonchi.  CARDIOVASCULAR: S1, S2 heard. No rubs, murmur or gallop.  ABDOMEN: Soft, nontender, nondistended. Bowel sounds present.  EXTREMITIES: No edema. No clubbing. No cyanosis. Pedal pulses felt bilaterally.  PSYCHIATRIC: Appropriate affect. Awake, alert x3. Intact  judgment and insight.  NEUROLOGIC: Cranial nerves grossly intact. Motor 5 out of 5. No focal deficits.  LYMPHATIC: No cervical lymphadenopathy.  MUSCULOSKELETAL: No joint erythema or effusion or tenderness.   PERTINENT LABORATORY DATA: BNP 1747. Glucose 188, BUN 26, creatinine 1.18, sodium 138, potassium 4, chloride 107, ALT 24, AST 32, alkaline phosphatase 93. Troponin 0.04. White blood cells 14, hemoglobin 6.9, hematocrit 24.2, platelets 378. EKG showing paced rhythm at 93 beats per minute.   IMAGING: Chest x-ray showing marked cardiomegaly and emphysematous change, without superimposed acute pulmonary process.   ASSESSMENT AND PLAN:  1. Chest pain. This is most likely related to unstable angina versus demand ischemia from the patient's anemia. He already received 324 of aspirin by EMS. At this point, there is no further anticoagulation due to his gastrointestinal bleed and significant anemia. Chest pain much improved after receiving nitro paste and p.r.n. morphine. Will keep him on oxygen. Will continue to cycle his cardiac enzymes. Discussed with cardiology, Dr. Mariah Milling, who will see the patient today. Will obtain echo at this point, and given the fact that the patient is on Xarelto, it is going to be very hard to have cardiac catheterization done immediately because of that. Will transfuse blood to correct his anemia as  very likely demand ischemia contributing to it.  3. Gastrointestinal bleed. This is most likely upper gastrointestinal bleed as the patient is having elevated BUN. Denies any history of gastric ulcers in the past. Will admit him to CCU. Will continue to cycle his hemoglobin every 6 hours. Will start him on Protonix drip. Will keep him n.p.o. Will transfuse packed red blood cells. Discuss with Dr. Marva Panda.  3. Hypertension. Blood pressure is on the higher side, so will resume him back on his beta blockers. Will hold Lasix.  4. History of depression. Will continue him on sertraline.   5. Diabetes mellitus. Will have him on insulin sliding scale. Will hold his metformin.  6. Questionable history of congestive heart failure and atrial fibrillation/atrial flutter. Unclear at this point if he is having history of congestive heart failure or atrial fibrillation. Will request his medical records from [place]. Will hold on starting digoxin. We will check digoxin level first. Will continue him on beta blockers. We are repeating echo. Will check EF on that echo. Holding Lasix meanwhile for the patient.  7. Deep vein thrombosis prophylaxis. Sequential compression device. No chemical anticoagulation due to gastrointestinal bleed.   CODE STATUS: Discussed with the patient. He is wishing to be full code.   TOTAL TIME SPENT ON ADMISSION AND PATIENT CARE: 65 minutes.   ____________________________ Starleen Arms, MD dse:lb D: 12/30/2012 08:03:02 ET T: 12/30/2012 08:33:24 ET JOB#: [id number]  cc: Starleen Arms, MD, <Dictator> Glennon Kopko Teena Irani MD ELECTRONICALLY SIGNED 01/24/2013 2:27

## 2014-06-11 NOTE — H&P (Signed)
PATIENT NAME:  Nicolas Morris, Nicolas Morris MR#:  [id number] DATE OF BIRTH:  02-Dec-1933  DATE OF ADMISSION:  06/27/2013  PRIMARY CARE PHYSICIAN:  The patient could not recall his primary care physician's name.    CARDIOLOGIST:  Dr. Mariah Milling.   REFERRING PHYSICIAN:  Dr. Lavella Lemons.   CHIEF COMPLAINT:  Chest pain and shortness of breath.   HISTORY OF PRESENT ILLNESS:  The patient is a 81 year old Caucasian male with past medical history of coronary artery disease status post myocardial infarction, seen by Dr. Mariah Milling in the past, chronic CHF, chronic atrial fibrillation on Xarelto, COPD, hypertension, diabetes mellitus who is presenting to the ER with a chief complaint of chest pain.  The patient is reporting that he was resting comfortably last night in his hospital bed and watching TV and then he suddenly started developing mid sternal chest pain associated with shortness of breath.  At around 7:00 p.m., he first noticed chest pain and the pain was persistent in nature.  The patient felt like someone was sitting on his chest.  It was associated with dizziness and diaphoresis.  The patient called his wife who went to visit her sister.  Wife came in and called EMS.  EMS brought him into the ER and the patient started having nausea in the ER.  He denies any vomiting.  The patient was given aspirin and nitroglycerin was given.  Subsequently pain was resolved after nitroglycerin.  The patient was seen by Dr. Mariah Milling in the past.  The patient had cardiac catheterization x 2 in December 2013 and at the same time he had a pacer defibrillation placement.  During my examination, the patient is resting comfortably without any chest pain.  No family members at bedside.  The patient is reporting that he follows Dr. Mariah Milling on a regular basis, but could not recall when was his last stress test.  The patient was given Lovenox 1 mg/kg subQ in the ED.  EKG did not reveal any changes.  First set of troponin was negative.   PAST MEDICAL HISTORY:   Diabetes mellitus, hypertension, coronary artery disease status post MI in December 2013, history of chronic congestive heart failure, chronic atrial fibrillation on Xarelto, COPD.  Chronic history of cirrhosis.  PAST SURGICAL HISTORY:  Pacemaker defibrillator insertion in December 2013.  Cardiac catheterization in December 2013.   ALLERGIES:  No known drug allergies.   PSYCHOSOCIAL HISTORY:  Lives at home with wife.  He used to smoke, but quit smoking several years ago.  Denies alcohol or illicit drug usage.   FAMILY HISTORY:  Coronary artery disease runs in his family.   REVIEW OF SYSTEMS:  CONSTITUTIONAL:  Denies any fever, fatigue, weight gain or weight loss.  EYES:  Denies blurry vision, double vision, inflammation, or glaucoma.  EARS, NOSE, THROAT:  Denies epistaxis, discharge, tinnitus.  RESPIRATION:  Denies cough, wheezing, or hemoptysis.  Has a chronic history of COPD.  CARDIOVASCULAR:  Complaining of chest pain associated with shortness of breath, improved after nitro paste is attached to the anterior chest wall.  Denies any palpitations or syncope.  GASTROINTESTINAL:  Denies vomiting, but had nausea.  Denies diarrhea, abdominal pain, hematemesis, or melena.  GENITOURINARY:  No dysuria, hematuria, or renal colic. ENDOCRINE:  Denies polyuria, nocturia, heat or cold intolerance. HEMATOLOGIC AND LYMPHATIC:  Has chronic anemia.  Denies bleeding or bruising.  INTEGUMENTARY:  No acne, rash, lesions.  MUSCULOSKELETAL:  No joint effusion, tenderness, erythema.  Denies any arthritis, swelling or gout.  NEUROLOGIC:  Denies CVA,  TIA, tremors.  PSYCHIATRIC:  Denies anxiety, insomnia, ADD, OCD.   HOME MEDICATIONS:  Xarelto 20 mg by mouth once daily, trazodone 50 mg 4 tablets by mouth once daily, sertraline 100 mg by mouth once daily, Protonix 40 mg once daily, potassium chloride 20 mEq by mouth once daily, metformin 500 mg by mouth twice daily, meclizine 20 mg by mouth 3 times a day, gabapentin  300 mg 2 times a day, furosemide 40 mg 2 times a day, digoxin 250 mcg by mouth once daily, Coreg 12.5 by mouth twice daily, Tylenol 325 mg 2 tablets by mouth q. 4 hours as needed, aspirin 81 mg by mouth once daily.   PHYSICAL EXAMINATION: VITAL SIGNS:  Temperature 99.4, pulse 101, respirations 22, blood pressure 164/68, pulse ox 95%.  GENERAL APPEARANCE:  Obese, Caucasian male under no apparent distress resting comfortably.  HEENT:  Normocephalic, atraumatic.  Pupils are equally reacting to light and accommodation.  No scleral icterus.  No conjunctival injection.  No sinus tenderness.  Moist mucous membranes.  No postnasal drip.  NECK:  Supple.  No JVD.  No thyromegaly.  Range of motion is intact.  LUNGS:  Clear to auscultation bilaterally.  No accessory muscle usage.  Minimal anterior chest wall tenderness mid sternal area on palpation.  Pacemaker site is intact.  CARDIOVASCULAR:  Regular rate and rhythm.  No peripheral edema.  GASTROINTESTINAL:  Soft, obese.  Bowel sounds are positive in all four quadrants.  Nontender, nondistended.  No hepatosplenomegaly.  No masses felt.  NEUROLOGIC:  Awake, alert, oriented x 3.  Cranial nerves II through XII are grossly intact.  Motor and sensory are intact.  Reflexes are 2+.  EXTREMITIES:  No edema.  No cyanosis.  No clubbing.  SKIN:  Warm to touch.  Normal turgor.  No rashes.  No lesions.  MUSCULOSKELETAL:  No joint effusion, tenderness, erythema.  PSYCHIATRIC:  Normal mood and affect.   LABORATORIES AND IMAGING STUDIES:  Chest x-ray, portable, borderline cardiomegaly and apparent COPD without superimposed acute cardiopulmonary process.  BNP is elevated at 1482, glucose 164.  The rest of the BMP is normal.  GFR is at 55.  LFTs are normal.  First set of cardiac enzymes are normal.  Digoxin 0.29.  WBC 12.9, hemoglobin 8.7, hematocrit 31.1, platelets 276, MCV 63.  A 12-lead EKG:  Paced rhythm at 95 beats per minute.   ASSESSMENT AND PLAN:  A 81 year old  Caucasian male presenting to the Emergency Room with a chief complaint of chest pain associated with shortness of breath will be admitted with the following assessment and plan.  1.  Chest pain, with past medical history of myocardial infarction, status post cardiac catheterization in December 2013.  We will rule out acute myocardial infarction.  Admit him to telemetry.  Acute coronary syndrome protocol with oxygen, nitroglycerin, aspirin, beta blocker and statin.  Cycle cardiac biomarkers.  Cardiology consult is placed to Dr. Mariah Milling.  Lovenox 1 mg/kg subQ x 1 was given in the Emergency Room by the Emergency Department physician.  We will monitor the trend of troponins.  2.  Chronic history of congestive heart failure.  No acute exacerbation.  We will continue Lasix by mouth twice daily with potassium supplement.  We will continue Coreg, aspirin and statin.  3.  Chronic atrial fibrillation, rate controlled.  Continues Xarelto.  4.  Hypertension.  Blood pressure is elevated which could be from stress.  We will continue his home medications and up-titrate if needed.  5.  Diabetes mellitus:  The patient will be on sliding scale insulin and continue his home medication metformin.  6.  Chronic history of chronic obstructive pulmonary disease, no exacerbation.  We will provide nebulizer treatments as needed.  7.  Chronic history of liver cirrhosis and chronic anemia.  Currently, the patient has no bleeding or bruises.   8.  We will provide him gastrointestinal prophylaxis with Protonix.  9.  Deep vein thrombosis prophylaxis.  The patient is on Xarelto.  We will continue that.  10.  CODE STATUS:  HE IS FULL CODE and wife is his medical power of attorney.   Diagnosis and plan of care was discussed in detail with the patient.  He is aware of the plan.   Total time spent on admission is 50 minutes.    ____________________________ Ramonita Lab, MD ag:ea D: 06/27/2013 04:25:12 ET T: 06/27/2013 06:01:25  ET JOB#: [id number]  cc: Ramonita Lab, MD, <Dictator> Antonieta Iba, MD Ramonita Lab MD ELECTRONICALLY SIGNED 06/28/2013 6:51

## 2014-06-11 NOTE — Discharge Summary (Signed)
PATIENT NAME:  Nicolas Morris, Nicolas Morris MR#:  [id number] DATE OF BIRTH:  1933/07/13  DATE OF ADMISSION:  06/27/2013 DATE OF DISCHARGE:  06/28/2013  CONSULTANT: Dr. Rollene Rotunda from cardiology.   PRIMARY CARDIOLOGIST: Dr. Mariah Milling.   CHIEF COMPLAINT: Chest pain and shortness of breath.   DISCHARGE DIAGNOSES:  1. Chest pain, likely not cardiac, ruled out for acute coronary syndrome with low-risk stress test.  2. Diabetes.  3. Hypertension.  4. History of coronary artery disease.  5. History of chronic systolic congestive heart failure with ejection fraction of about 45% to 50%.  6. History of atrial fibrillation.  7. History of cirrhosis per chart which the patient denies.  8. History of coronary artery disease, status post myocardial infarction in December 2013.   DISCHARGE MEDICATIONS: Meclizine 25 mg 3 times a day as needed, metformin 500 mg 2 times a day, sertraline 100 mg once a day, digoxin 250 mcg daily, potassium chloride 20 mEq daily, furosemide 40 mg 2 times a day, carvedilol 12.5 mg 2 times a day, Protonix 40 mg once a day, trazodone 50 mg 4 tabs at bedtime once a day, gabapentin 300 mg 2 times a day, Xarelto 20 mg daily in the evening, aspirin 81 mg daily, lisinopril 2.5 mg daily, atorvastatin 20 mg at bedtime.   DIET: Low-sodium, low-fat, low-cholesterol, ADA diet.   ACTIVITY: As tolerated.   FOLLOWUP: Please follow with PCP and cardiologist within 1 to 2 weeks.   DISPOSITION: Home.   SIGNIFICANT LABORATORIES AND IMAGING: Stress test which was overall a low-risk scan with no significant ischemia, but there is a fixed inferolateral defect which is worse on the rest images, likely artifact; however, small scar could not be excluded. EF of 51%. No EKG changes concerning for ischemia. LDL 87. Hemoglobin A1c 7.7. Troponins were negative x 3. Initial white count of 12.9, fall in white count to 8.4. Initial hemoglobin 8.7.   HISTORY OF PRESENT ILLNESS AND HOSPITAL COURSE: For full details  of H and P, please see the dictation on May 10 by Dr. Amado Coe, but briefly, this is a pleasant 81 year old male with history of CAD, status post MI, chronic systolic CHF, Afib on Xarelto, COPD, hypertension, diabetes who comes in for shortness of breath and some chest pain. He also had some pressure with feeling of someone sitting on his chest, associated with dizziness and diaphoresis. He was brought in here where he was admitted to the hospitalist service. He was ruled out for acute coronary syndrome with cyclic cardiac markers and was seen by cardiology and underwent a stress test which was deemed to be low risk. At this point, he has had no further chest pain since the night of admission. He did get a dose of Lovenox therapeutic but now is on Xarelto. His vitals were stable. His LDL was checked and was within goal, but given his CAD, he will be discharged on a statin. He also has chronic systolic CHF. This is at baseline. He is not in acute CHF. In regards to Afib, he is rate controlled and he will be discharged on Xarelto. He is instructed to follow with cardiology and PCP for followup. His COPD is at baseline without significant wheezing or shortness of breath.   PHYSICAL EXAMINATION:  VITAL SIGNS: On the day of discharge, temperature is 98.2, pulse rate 76, respiratory rate 18, blood pressure 146/78, O2 sat 95%.  GENERAL: The patient is obese, elderly male, lying in bed, no obvious distress, talking in full sentences.  HEENT:  Normocephalic, atraumatic.  CARDIOVASCULAR: Normal S1, S2 but irregular.  LUNGS: Clear.  EXTREMITIES: No pitting edema.    TOTAL TIME SPENT: About 32 minutes.   The patient is FULL CODE.   ____________________________ Krystal Eaton, MD sa:gb D: 06/28/2013 15:45:41 ET T: 06/29/2013 00:50:39 ET JOB#: [id number]  cc: Krystal Eaton, MD, <Dictator> Antonieta Iba, MD Krystal Eaton MD ELECTRONICALLY SIGNED 07/13/2013 18:30

## 2014-06-11 NOTE — Consult Note (Signed)
General Aspect Chest pain.   Present Illness The patient has a complicated cardiac history with nonischemic cardiomyopathy, with notes indicating ejection fraction less than 30% at [facility] at the end of 2013, ICD placement December 2013, last cath May 2014.  Last echo 11/14 with EF of 45 - 50%.  He also has a history of anemia and previous GI bleed.  He is on Xarelto for atrial fib.  Of note/ I could not find evidence that we ever had the cath report from [facility].  During his last admit to [facility] however, he had chest pain and was managed medically after treatment of anemia.  He has had no recent stress testing.   He presented via EMS for evaluation of chest pain.  In the ED he had paced ventricular rhythm.  He has had negative cardiac enzymes.  He reports that he has been weak recently but he has had no chest pain.  He says that this pain started at rest.  It was like somebody squeezing the middle of his chest.  He did have left arm pain.  He had some SOB. He put up with it for six hours.  He says it is similar to the pain he had in the past.  There was some improvement after NTG in the ED.  He is now pain free.  He otherwise has not had recent pain while walking his dog.  He does not have acute SOB.   PMH: Cardiomyopathy, atrial fib, GI bleed, sleep apnea (CPAP) PHS:  CRT/D, right rotator cuff, left ankle surgery   Physical Exam:  GEN well developed, no acute distress, obese   HEENT PERRL, no dentition   NECK supple  No masses   RESP normal resp effort  clear BS   CARD Irregular rate and rhythm  Normal, S1, S2  S3  Right carotid bruit   ABD denies tenderness  no liver/spleen enlargement  no hernia  no Adominal Mass   LYMPH negative neck   EXTR negative cyanosis/clubbing, negative edema, Decreased DP/PT   SKIN normal to palpation   NEURO cranial nerves intact, motor/sensory function intact   PSYCH A+O to time, place, person   Review of Systems:  General: Weakness    Musculoskeletal: Back and neck pain   Review of Systems: All other systems were reviewed and found to be negative   Medications/Allergies Reviewed Medications/Allergies reviewed   Family & Social History:  Family and Social History:  Family History Cancer  Daughter leukemia age 43, father and brother with liver cancer   Social History positive  tobacco, previous 50 year history of tobacco   Place of Living Home  Apartment   Home Medications: Medication Instructions Status  Protonix 40 mg oral delayed release tablet 1 tab(s) orally once a day Active  acetaminophen 325 mg oral tablet 2 tab(s) orally every 4 hours, As needed, pain or temp. greater than 100.4 Active  carvedilol 12.5 mg oral tablet 1 tab(s) orally 2 times a day Active  traZODone 50 mg oral tablet 4 tab(s) orally once a day (at bedtime) Active  gabapentin 300 mg oral capsule 1  orally 2 times a day Active  Xarelto 20 mg oral tablet 1 tab(s) orally once a day (in the evening) Active  Aspir 81 81 mg oral tablet 1 tab(s) orally once a day Active  meclizine 25 mg oral tablet 1 tab(s) orally 3 times a day, As Needed Active  metFORMIN 500 mg oral tablet 1 tab(s) orally  2 times a day Active  sertraline 100 mg oral tablet 1 tab(s) orally once a day Active  digoxin 250 mcg (0.25 mg) oral tablet 1 tab(s) orally once a day Active  potassium chloride 20 mEq oral tablet, extended release  orally once a day Active  furosemide 40 mg oral tablet 1 tab(s) orally 2 times a day Active   Lab Results: Hepatic:  10-May-15 01:09   Bilirubin, Total 0.4  Alkaline Phosphatase 72 (45-117 NOTE: New Reference Range 01/08/13)  SGPT (ALT) 26  SGOT (AST) 37  Total Protein, Serum 7.7  Albumin, Serum 3.4  TDMs:  10-May-15 01:09   Digoxin, Serum 0.29 (Therapeutic range for digoxin in patients with atrial fibrillation: 0.8 - 2.0 ng/mL. In patients with congestive heart failure a therapeutic range of 0.5 - 0.8 ng/mL is suggested as higher levels  are associated with an increased risk of toxicity without clear evidence of enhanced efficacy. Digoxin toxicity is commonly associated with serum levels > 2.0 ng/mL but may occur with lower levels, including those in the therapeutic range. Blood samples should be obtained 6-8 hours after administration to assure a reasonable volume of distribution.)  Routine Chem:  10-May-15 01:09   B-Type Natriuretic Peptide [facility])  1482 (Result(s) reported on 27 Jun 2013 at 01:47AM.)  Result Comment HGB/HCT - RESULTS VERIFIED BY REPEAT TESTING.  Result(s) reported on 27 Jun 2013 at 01:38AM.  Glucose, Serum  164  BUN 16  Creatinine (comp) 1.25  Sodium, Serum 136  Potassium, Serum 4.2  Chloride, Serum 102  CO2, Serum 27  Calcium (Total), Serum 9.0  Osmolality (calc) 277  eGFR (African American) >60  eGFR (Non-African American)  55 (eGFR values <71m/min/1.73 m2 may be an indication of chronic kidney disease (CKD). Calculated eGFR is useful in patients with stable renal function. The eGFR calculation will not be reliable in acutely ill patients when serum creatinine is changing rapidly. It is not useful in  patients on dialysis. The eGFR calculation may not be applicable to patients at the low and high extremes of body sizes, pregnant women, and vegetarians.)  Anion Gap 7  Cardiac:  10-May-15 01:09   CPK-MB, Serum 0.9 (Result(s) reported on 27 Jun 2013 at 01:47AM.)  Troponin I < 0.02 (0.00-0.05 0.05 ng/mL or less: NEGATIVE  Repeat testing in 3-6 hrs  if clinically indicated. >0.05 ng/mL: POTENTIAL  MYOCARDIAL INJURY. Repeat  testing in 3-6 hrs if  clinically indicated. NOTE: An increase or decrease  of 30% or more on serial  testing suggests a  clinically important change)  CK, Total 224 (39-308 NOTE: NEW REFERENCE RANGE  03/22/2013)    05:29   CPK-MB, Serum 1.0 (Result(s) reported on 27 Jun 2013 at 0[facility])  Troponin I 0.03 (0.00-0.05 0.05 ng/mL or less: NEGATIVE  Repeat testing  in 3-6 hrs  if clinically indicated. >0.05 ng/mL: POTENTIAL  MYOCARDIAL INJURY. Repeat  testing in 3-6 hrs if  clinically indicated. NOTE: An increase or decrease  of 30% or more on serial  testing suggests a  clinically important change)    09:00   CPK-MB, Serum 1.3 (Result(s) reported on 27 Jun 2013 at 09:33AM.)  Troponin I 0.03 (0.00-0.05 0.05 ng/mL or less: NEGATIVE  Repeat testing in 3-6 hrs  if clinically indicated. >0.05 ng/mL: POTENTIAL  MYOCARDIAL INJURY. Repeat  testing in 3-6 hrs if  clinically indicated. NOTE: An increase or decrease  of 30% or more on serial  testing suggests a  clinically important change)  Routine Hem:  10-May-15  01:09   WBC (CBC)  12.9  RBC (CBC) 4.91  Hemoglobin (CBC)  8.7  Hematocrit (CBC)  31.1  Platelet Count (CBC) 276  MCV  63  MCH  17.8  MCHC  28.1  RDW  20.0  Neutrophil % 76.4  Lymphocyte % 12.9  Monocyte % 9.1  Eosinophil % 0.8  Basophil % 0.8  Neutrophil #  9.8  Lymphocyte # 1.7  Monocyte #  1.2  Eosinophil # 0.1  Basophil # 0.1   EKG:  EKG Interp. by me  Probable underlying atrial fib with ventricular pacing rate 95   Radiology Results: XRay:    10-May-15 01:38, Chest Portable Single View  Chest Portable Single View   REASON FOR EXAM:    cp  COMMENTS:       PROCEDURE: DXR - DXR PORTABLE CHEST SINGLE VIEW  - Jun 27 2013  1:38AM     CLINICAL DATA:  Chest pain and shortness of breath.    EXAM:  PORTABLE CHEST - 1 VIEW    COMPARISON:  DG CHEST 1V PORT dated 12/30/2012    FINDINGS:  The cardiac silhouette is upper limits of normal, similar. Mildly  calcified aortic knob. Increased lung volumes, with similar mild  interstitial prominence, somewhat confluent in the lung bases. No  pleural effusions or focal consolidations. No pneumothorax. Dual  lead left cardiac pacemaker in situ. Soft tissue planes and included  osseous structures are nonsuspicious.     IMPRESSION:  Borderline cardiomegaly and apparent  COPD without superimposed acute  cardiopulmonary process.      Electronically Signed    By: Elon Alas    On: 06/27/2013 01:42       Verified By: Ricky Ala, M.D.,    No Known Allergies:   Vital Signs/Nurse's Notes: **Vital Signs.:   10-May-15 11:45  Vital Signs Type Routine  Temperature Temperature (F) 98.1  Celsius 36.7  Pulse Pulse 77  Respirations Respirations 18  Systolic BP Systolic BP 109  Diastolic BP (mmHg) Diastolic BP (mmHg) 68  Mean BP 96  Pulse Ox % Pulse Ox % 94  Pulse Ox Activity Level  At rest  Oxygen Delivery 2L    Plan CHEST PAIN:  Some typical and some atypical features.  However, given the fact the the pain was prolonged and he has had this discomfort before with apparently nonobstructive disease (no history of PCI) I think that we could schedule a Lexiscan for the AM.  No further work up if negative.  BRUIT:  He should have a carortid Doppler at some point.  CRT/D:  He is up to date with follow up.  ATRIAL FIB:  Agree with continuing anticoagulation.   Electronic Signatures: Minus Breeding (MD)  (Signed 10-May-15 13:58)  Authored: General Aspect/Present Illness, History and Physical Exam, Review of System, Family & Social History, Home Medications, Labs, EKG , Radiology, Allergies, Vital Signs/Nurse's Notes, Impression/Plan   Last Updated: 10-May-15 13:58 by Minus Breeding (MD)

## 2014-06-13 LAB — SURGICAL PATHOLOGY

## 2014-06-19 NOTE — Op Note (Signed)
PATIENT NAME:  Nicolas Morris, Nicolas Morris MR#:  [id number] DATE OF BIRTH:  07-02-1933  DATE OF PROCEDURE:  03/25/2014  ATTENDING PHYSICIAN: Cristal Deer A. Cliff Damiani, MD  PREOPERATIVE DIAGNOSIS: Acute cholecystitis.   POSTOPERATIVE DIAGNOSIS: Acute versus chronic cholecystitis.   ANESTHESIA: General.   ESTIMATED BLOOD LOSS: 10 mL    COMPLICATIONS: None.   SPECIMENS: Gallbladder.   INDICATION FOR SURGERY: [name] is a pleasant 81 year old with a history of epigastric pain and an elevated white blood cell count, with CT scan concerning for acute cholecystitis. He was brought into the Operating Room for cholecystectomy.   DETAILS OF PROCEDURE: Informed consent was obtained. Mr. Posten was brought to the Operating Room Suite. He was induced. Endotracheal tube was placed. General anesthesia was administered. His abdomen was prepped and draped in standard surgical fashion. A timeout was then performed, correctly identifying the patient name, operative site and procedure to be performed. A supraumbilical incision was made. It was deepened down to the fascia. The fascia was incised. The peritoneum was entered. Two stay sutures were placed at the fasciotomy. A Hassan trocar was placed in the abdomen. An 11 mm epigastric and two 5 mm right subcostal trocars were placed. The gallbladder was then lifted up over the dome of the liver. There was a small amount of adhesions, but the gallbladder looked relatively chronically inflamed. The cystic duct and cystic artery were dissected out. Critical view was obtained. These were clipped and ligated. The gallbladder was then taken off the gallbladder fossa using hook cautery and brought out through an Endo Catch bag. The gallbladder fossa was then examined for hemostasis and hemostasis was obtained using cautery. The abdomen was then desufflated and all trocars were taken out under direct visualization. The previously placed stay sutures and a figure-of-eight 0 Vicryl used to  close the supraumbilical fascia. The skin was then closed with 4-0 Monocryl deep dermal sutures. Steri-Strips, Telfa gauze and Tegaderm were used to complete the dressing. The patient was then awoken, extubated and brought to the postanesthesia care unit. There were no immediate complications. Needle, sponge, and instrument count was correct at the end of the procedure.   ____________________________ Si Raider. Odis Wickey, MD cal:MT D: 03/25/2014 10:46:55 ET T: 03/25/2014 11:20:04 ET JOB#: [id number]  cc: Cristal Deer A. Denecia Brunette, MD, <Dictator> Jarvis Newcomer MD ELECTRONICALLY SIGNED 04/07/2014 22:26

## 2014-06-19 NOTE — H&P (Signed)
PATIENT NAME:  Nicolas Morris, Nicolas Morris MR#:  [id number] DATE OF BIRTH:  [birth year]  DATE OF ADMISSION:  05/29/2014  REFERRING PHYSICIAN: Rebecka Apley, MD    PRIMARY CARE PHYSICIAN: Nonlocal.   CARDIOLOGIST: Seems to follow with someone out in [place]; however, he does not know the name.   CHIEF COMPLAINT: Chest pain.   HISTORY OF PRESENT ILLNESS: A 81 year old Caucasian gentleman with a history of coronary artery disease; congestive heart failure, EF 45%; paroxysmal atrial fibrillation; type 2 diabetes, non-insulin-requiring; as well as a history of AICD and permanent pacemaker insertion presenting with chest pain. Describes acute onset of chest pain which occurred at rest, central in location, pressure in quality, 8-9/10 intensity, no worsening or relieving factors, no radiation, had associated shortness of breath. Fortunately, symptoms resolved on presentation to the Emergency Department. No further chest pain at this point in time.   REVIEW OF SYSTEMS:  CONSTITUTIONAL: Denies fevers, chills, fatigue, weakness.  EYES: Denies blurred vision, double vision, or eye pain. EAR, NOSE, AND THROAT: Denies tinnitus, ear pain, hearing loss.   RESPIRATORY: Denies cough, wheeze. Positive for shortness of breath, as stated above.  CARDIOVASCULAR: Positive for chest pain, as stated above. Denies any orthopnea, palpitations, edema.  GASTROINTESTINAL: Denies nausea, vomiting, diarrhea, or abdominal pain.  GENITOURINARY: Denies dysuria or hematuria.  ENDOCRINE: Denies nocturia or thyroid problems.  HEMATOLOGIC AND LYMPHATIC: Denies easy bruising or bleeding.  SKIN: Denies rash or lesions.  MUSCULOSKELETAL: Denies pain in neck, back, shoulders, knees, hips, or arthritic symptoms.  NEUROLOGIC: Denies paralysis, paresthesias. PSYCHIATRIC: Denies anxiety or depressive symptoms. Otherwise, a full review of systems performed by me is negative.   PAST MEDICAL HISTORY: Includes coronary artery disease, permanent  pacemaker insertion, AICD placement; type 2 diabetes, uncomplicated;  paroxysmal atrial fibrillation. The patient is a fairly poor historian as far as the details, most of this was obtained from prior documentation.   SOCIAL HISTORY: Remote tobacco use. No alcohol use, no drug use.   FAMILY HISTORY: No known cardiovascular or pulmonary disorders.   ALLERGIES: No known drug allergies.   HOME MEDICATIONS: Include acetaminophen/hydrocodone 325/5 mg p.o. q. 4 hours as needed for pain, aspirin 81 mg p.o. daily, digoxin 250 mcg  p.o. daily, Xarelto 20 mg p.o. daily, gabapentin 300 mg p.o. b.i.d., sertraline 100 mg p.o. daily, trazodone 50 mg p.o. at bedtime, metformin 500 mg p.o. b.i.d., meclizine 25 mg p.o. 3 times a day as needed for dizziness, lovastatin 20 mg p.o. daily, Coreg 12.5 mg p.o. b.i.d., Lasix 40 mg p.o. b.i.d., potassium 20 mEq p.o. daily, Protonix 40 mg p.o. daily.   PHYSICAL EXAMINATION:  VITAL SIGNS: Temperature 97.8, heart rate 77, respirations 18, blood pressure 135/73, saturating 98% on room air. Weight 106.6 kg, BMI of 30.2.  GENERAL: A well-nourished, well-developed Caucasian gentleman currently in no acute distress.  HEAD: Normocephalic, atraumatic.  EYES: Pupils equal, round, reactive to light. Extraocular muscles intact. No scleral icterus.  MOUTH: Moist mucosal membrane. Dentition intact. No abscess noted.  EAR, NOSE, AND THROAT: Clear without exudates. No external lesions.  NECK: Supple. No thyromegaly. No nodules. No JVD.  PULMONARY: Clear to auscultation bilaterally without wheezes, rales, rhonchi. No use of accessory muscles. Good respiratory effort.  CHEST: Nontender to palpation.  CARDIOVASCULAR: S1, S2, regular rate and rhythm. No murmurs, rubs, or gallops. No edema. Pedal pulses 2+ bilaterally. GASTROINTESTINAL: Soft, nontender, nondistended. No masses. Positive bowel sounds. No hepatosplenomegaly.    MUSCULOSKELETAL: No swelling, clubbing, or edema. Range of motion  is full  in all extremities.  NEUROLOGIC: Cranial nerves II-XII intact. No gross focal neurological deficits. Sensation intact. Reflexes intact.   SKIN: No ulcerations, lesions, rashes, or cyanosis. Skin warm, dry. Turgor intact.  PSYCHIATRIC: Mood and affect within normal limits. The patient is awake, alert, oriented x 3. Insight and judgment intact.   LABORATORY DATA: EKG performed reveals V-paced rhythm. No ST or T wave abnormalities. Chest x-ray performed, no acute cardiopulmonary process. Remainder of laboratory data: Sodium 136, potassium 4.8, chloride 104, bicarbonate 24, BUN 15, creatinine 0.9, glucose of 193. Troponin 0.05. WBC 13.1, hemoglobin 8.3, platelets of 306,000.   ASSESSMENT AND PLAN: A 81 year old gentleman with history of coronary artery disease, congestive heart failure, ejection fraction 45%, presenting with chest pain.  1.  Chest pain, central in location. Initiate aspirin and statin therapy. Place on telemetry. Trend cardiac enzymes x 3.   2.  Type 2 diabetes non-insulin-requiring, uncomplicated. Hold p.o. agents. Add insulin sliding scale with q. 6 hours Accu-Cheks.  3.  Hyperlipidemia, unspecified. Continue with statin therapy. 4.  Gastroesophageal reflux disease without esophagitis. Proton pump inhibitor therapy.  5.  Venous thromboembolism prophylaxis on Xarelto.   CODE STATUS: The patient is a full code.    TIME SPENT: 45 minutes.    ____________________________ Cletis Athens. Hower, MD dkh:bm D: 05/29/2014 02:00:32 ET T: 05/29/2014 02:30:32 ET JOB#: [id number]  cc: Cletis Athens. Hower, MD, <Dictator> DAVID Synetta Shadow MD ELECTRONICALLY SIGNED 05/29/2014 21:24

## 2014-06-19 NOTE — Consult Note (Signed)
General Aspect Primary Cardiologist: Dr. Rockey Situ, MD _____________  81 year old male with history of nonischemic cardiomyopathy, cardiac catheterization October 2010 showing no significant CAD, ejection fraction 45% at that time, with notes indicating ejection fraction less than 30% at [facility] at the end of 2013, atrial fibrillation/flutter, type 2 diabetes, previously seen at [facility], started on anticoagulation for atrial fibrillation several years ago, ICD placement December 2013, several cardiac catheterizations in 2014 one in January and one in May, by his report with several ICD shocks in October 2014,??most recent nuclear stress test 06/2013 without evidence of ischemia, history of bright red blood per rectum who presented to [facility] with couple month history of RUQ pain, reflux, and diarrhea and was found to have GB disease.  Cardiology was consulted for pre-op evaluation.  ____________  PMH: 1. NICM 2. Chronic systolic CHF s/p MDT BiV ICD in 2013 3. Perament a-fib/flutter 4. DM2 5. History of GIB 6. Gastritis 7. Iron deficiency anemia ______________   Present Illness 81 year old male with the above problem list who presented to [facility] with couple month history of RUQ pain, reflux, and diarrhea and was found to have GB disease. He last underwent nuclear stress test 06/2013 secondary to chest pain admission to [facility]. Stress testing was Low risk without evidence of ischemia, or EKG changes. EF was 51%. It was recommended that he continue his current cardiac medications.   His last echo in 12/2012 showed ejection fraction 45-50%. Inferior and posterior wall hypokinesis, ICD wire noted, mildly elevated right ventricular systolic pressures, mild MR, mildly dilated left atrium.   He has previously been evaluated by GI for BRBPR. He underwent EGD which showed small hiatal hernia, esophagitis, erosions/gastritis, gastric varices, diverticulosis on colonoscopy with some internal  hemorrhoids. He has also has prior admission requiring blood transfusions and cessation of his Xarelto.   He presented to [facility] with a 2 month history of intermittent RUQ pain, reflux, and diarrhea. The day prior he noted increased SOB, though this was not new for him and has been noted previously with his RUQ pain. No chest pain, diaphoresis, vomiting, presyncope, syncope, or edema. No orthopnea. Upon his arrival to [facility] he was noted to have minimally distended gallbladder with gallbladder wall thickening and probable gallstones on CT scan. Abdominal US was c/w acute cholecystitis. pBNP 1997, WBC count 12.4, troponin negative. He currently has mild RUQ pain.   Physical Exam:  GEN well developed, no acute distress, obese   HEENT hearing intact to voice   NECK supple   RESP normal resp effort  wheezing   CARD Regular rate and rhythm  No murmur   ABD mild RUQ TTP   LYMPH negative neck   EXTR negative edema   SKIN normal to palpation   NEURO cranial nerves intact   PSYCH alert   Review of Systems:  General: Fatigue   Skin: No Complaints   ENT: No Complaints   Eyes: No Complaints   Neck: No Complaints   Respiratory: No Complaints   Cardiovascular: No Complaints   Gastrointestinal: Nausea  Diarrhea  reflux   Genitourinary: No Complaints   Vascular: No Complaints   Musculoskeletal: No Complaints   Neurologic: No Complaints   Hematologic: No Complaints   Endocrine: No Complaints   Psychiatric: No Complaints   Review of Systems: All other systems were reviewed and found to be negative   Medications/Allergies Reviewed Medications/Allergies reviewed   Family & Social History:  Family and Social  History:  Family History Negative  Hypertension   Social History negative tobacco, negative ETOH, negative Illicit drugs   + Tobacco Prior (greater than 1 year)  135 pack years   Place of Living Home     Diabetes:    HTN:    HI:    Pacemaker/ Defib.:     5 screws in left ankle:    Right rotator cuff:        Admit Diagnosis:   CHOLECYSTITIS ACUTE CHOLECYSTITIS CONDIT: Onset Date: 22-Mar-2014, Status: Active, Description: CHOLECYSTITIS ACUTE CHOLECYSTITIS CONDIT  Home Medications: Medication Instructions Status  Protonix 40 mg oral delayed release tablet 1 tab(s) orally once a day Active  carvedilol 12.5 mg oral tablet 1 tab(s) orally 2 times a day Active  traZODone 50 mg oral tablet 4 tab(s) orally once a day (at bedtime) Active  gabapentin 300 mg oral capsule 1  orally 2 times a day Active  Xarelto 20 mg oral tablet 1 tab(s) orally once a day (in the evening) Active  Aspir 81 81 mg oral tablet 1 tab(s) orally once a day Active  meclizine 25 mg oral tablet 1 tab(s) orally 3 times a day, As Needed Active  metFORMIN 500 mg oral tablet 1 tab(s) orally 2 times a day Active  sertraline 100 mg oral tablet 1 tab(s) orally once a day Active  digoxin 250 mcg (0.25 mg) oral tablet 1 tab(s) orally once a day Active  potassium chloride 20 mEq oral tablet, extended release  orally once a day Active  furosemide 40 mg oral tablet 1 tab(s) orally 2 times a day Active  Xarelto 20 mg oral tablet 1 tab(s) orally once a day (in the evening) Active  lovastatin 20 mg oral tablet 1 tab(s) orally once a day Active  carvedilol 12.5 mg oral tablet 1 tab(s) orally 2 times a day Active   Lab Results:  Hepatic:  02-Feb-16 01:42   Bilirubin, Total 0.8  Alkaline Phosphatase 91  SGPT (ALT) 21  SGOT (AST) 20  Total Protein, Serum 8.0  Albumin, Serum 3.4  Routine Chem:  02-Feb-16 01:42   Ferritin ([facility]) 14 (Result(s) reported on 22 Mar 2014 at 11:46AM.)  B-Type Natriuretic Peptide [facility])  1997 (Result(s) reported on 22 Mar 2014 at 03:45AM.)  Result Comment HGB/HCT - RESULTS VERIFIED BY REPEAT TESTING.  Result(s) reported on 22 Mar 2014 at 02:32AM.  Glucose, Serum  330  BUN 13  Creatinine (comp) 1.16  Sodium, Serum  135  Potassium, Serum 3.6  Chloride,  Serum 100  CO2, Serum 24  Calcium (Total), Serum 9.0  Osmolality (calc) 283  eGFR (African American) >60  eGFR (Non-African American) >60 (eGFR values <61m/min/1.73 m2 may be an indication of chronic kidney disease (CKD). Calculated eGFR, using the MRDR Study equation, is useful in  patients with stable renal function. The eGFR calculation will not be reliable in acutely ill patients when serum creatinine is changing rapidly. It is not useful in patients on dialysis. The eGFR calculation may not be applicable to patients at the low and high extremes of body sizes, pregnant women, and vegetarians.)  Anion Gap 11  Lipase 80 (Result(s) reported on 22 Mar 2014 at 02:15AM.)    04:12   Iron Binding Capacity (TIBC)  463  Unbound Iron Binding Capacity 441  Iron, Serum  22  Iron Saturation 5 (Result(s) reported on 22 Mar 2014 at 11:44AM.)  Cardiac:  02-Feb-16 01:42   Troponin I 0.05 (0.00-0.05 0.05 ng/mL or less: NEGATIVE  Repeat  testing in 3-6 hrs  if clinically indicated. >0.05 ng/mL: POTENTIAL  MYOCARDIAL INJURY. Repeat  testing in 3-6 hrs if  clinically indicated. NOTE: An increase or decrease  of 30% or more on serial  testing suggests a  clinically important change)  Routine UA:  02-Feb-16 01:42   Color (UA) Colorless  Clarity (UA) Clear  Glucose (UA) >=500  Bilirubin (UA) Negative  Ketones (UA) Negative  Specific Gravity (UA) 1.003  Blood (UA) Negative  pH (UA) 6.0  Protein (UA) Negative  Nitrite (UA) Negative  Leukocyte Esterase (UA) Negative (Result(s) reported on 22 Mar 2014 at 02:43AM.)  RBC (UA) NONE SEEN  WBC (UA) <1 /HPF  Bacteria (UA) NONE SEEN  Epithelial Cells (UA) <1 /HPF  Mucous (UA) PRESENT (Result(s) reported on 22 Mar 2014 at 02:43AM.)  Routine Hem:  02-Feb-16 01:42   WBC (CBC)  12.4  RBC (CBC) 5.10  Hemoglobin (CBC)  7.9  Hematocrit (CBC)  28.5  Platelet Count (CBC) 300  MCV  56  MCH  15.5  MCHC  27.8  RDW  20.2   EKG:  EKG Interp. by  me   Interpretation EKG shows NSR, 84, v-paced   Radiology Results: XRay:    02-Feb-16 02:24, Chest PA and Lateral  Chest PA and Lateral   REASON FOR EXAM:    dyspnea, epigastric pain eval  COMMENTS:       PROCEDURE: DXR - DXR CHEST PA (OR AP) AND LATERAL  - Mar 22 2014  2:24AM     CLINICAL DATA:  Acute onset of chest pain. Shortness of breath and  cough. Initial encounter.    EXAM:  CHEST  2 VIEW    COMPARISON:  Chest radiograph performed 09/25/2013    FINDINGS:  The lungs are well-aerated. Pulmonary vascularity is at the upper  limits of normal. Mildly increased interstitial markings are seen.  There is no evidence of pleural effusion or pneumothorax.    The heart is borderline normal in size. A pacemaker/AICD is noted at  the left chest wall, with leads ending at the right ventricle and  coronary sinus. No acute osseous abnormalities are seen.     IMPRESSION:  Mildly increased interstitial markings noted. Depending on the  patient's symptoms, this could reflect minimal interstitial edema.      Electronically Signed    By: Garald Balding M.D.    On: 03/22/2014 02:46     Verified By: JEFFREY . Radene Knee, M.D.,  [place]:    02-Feb-16 05:45, US Abdomen Limited Survey  US Abdomen Limited Survey   REASON FOR EXAM:    RUQ pain eval  COMMENTS:   Body Site: Gallbladder, Liver, Common Bile Duct    PROCEDURE: [place]  - US ABDOMEN LIMITED SURVEY  - Mar 22 2014  5:45AM     CLINICAL DATA:  Right upper quadrant abdominal pain.    EXAM:  US ABDOMEN LIMITED - RIGHT UPPER QUADRANT    COMPARISON:  CT earlier this day.  No prior ultrasound.    FINDINGS:  Gallbladder:  Partially distended and contains multiple gallstones. There is  diffuse gallbladder wall thickening measuring 4-5 mm. Sonographic  Murphy sign is positive. Small amount of pericholecystic fluid.    Common bile duct:    Diameter: 6 mm.    Liver:    No focal lesion identified. Diffusely increased  parenchymal  echogenicity.     IMPRESSION:  1. Cholelithiasis with diffuse gallbladder wall thickening,  pericholecystic fluid, and positive sonographic Murphy's  sign,  constellation of findings consistent with acute cholecystitis.  2. Hepatic steatosis.      Electronically Signed    By: Jeb Levering M.D.    On: 03/22/2014 06:39         Verified By: Rollene Fare. Marisue Humble, M.D.,  CT:    02-Feb-16 04:13, CT Abdomen and Pelvis With Contrast  CT Abdomen and Pelvis With Contrast   REASON FOR EXAM:    (1) upper, RUQ abd pain eval; (2) upper, RUQ abd pain   eval  COMMENTS:       PROCEDURE: CT  - CT ABDOMEN / PELVIS  W  - Mar 22 2014  4:13AM     CLINICAL DATA:  Right upper quadrant and midepigastric pain.  Diarrhea. Symptoms since yesterday.    EXAM:  CT ABDOMEN AND PELVIS WITH CONTRAST    TECHNIQUE:  Multidetector CT imaging of the abdomen and pelvis was performed  using the standard protocol following bolus administration of  intravenous contrast.    CONTRAST:  100 mL Omnipaque 300 IV.    COMPARISON:  CT abdomen/ pelvis 01/01/2013    FINDINGS:  Small bilateral pleural effusions, this is diminished in the right  and increased on the left compared to prior CT. There is a small  hiatal hernia. Pacemaker wires are partiallyincluded. Punctate  granuloma in the right lower lobe.    The liver demonstrates diffusely decreased density. There are  punctate granuloma in the right hepatic lobe. No evidence of focal  hepatic lesion. Questionable nodular hepatic contour is againseen.  Gallbladder is minimally distended, there is gallbladder wall  thickening and probable gallstones. Splenomegaly is again seen,  measuring 14.4 cm in AP dimension. Splenic granulomas are again  noted.    Small lymph nodes measuring 1 cm in short axis dimension adjacent to  the celiac axis, unchanged from prior exam. The adrenal glands and  pancreas are normal. Kidneys demonstrate symmetric  enhancement and  excretion. 2.6 cm cyst in the lower left kidney, 1.8 cm cyst in the  mid right kidney, unchanged allowing for differences in caliper  placement.    There is trace perisplenic ascites.  No free air.    There are no dilated or thickened bowel loops. Colonic diverticular  again seen in the sigmoid colon. There is no diverticulitis. The  appendix is normal.    The abdominal aorta is normal in caliber with moderate to dense  atherosclerosis. No retroperitoneal adenopathy.    The urinary bladder is physiologically distended. Prostate gland  measures 5.3 cm in transverse dimension. Thereis no pelvic ascites.    Degenerative disc disease and facet arthropathy in the lumbar spine.  No acute or suspicious osseous abnormality.     IMPRESSION:  1. Minimally distended gallbladder with gallbladder wall thickening  and probable gallstones. Given patient has right upper quadrant  abdominal pain, consider ultrasound for further evaluation of the  hepatobiliary tree.  2. Unchanged decreased hepatic density consistent with hepatic  steatosis. Questionable nodular contour may reflect ileus versus,  this appears similar to prior exam. Small amount is perisplenic  ascites.  3. Stable chronic findings include splenomegaly. Diverticulosis  without diverticulitis. Bilateral renal cysts.      Electronically Signed    By: Jeb Levering M.D.    On: 03/22/2014 04:25       Verified By: Rollene Fare. Marisue Humble, M.D.,    No Known Allergies:   Vital Signs/Nurse's Notes: **Vital Signs.:   02-Feb-16 09:32  Vital Signs Type  Routine  Temperature Temperature (F) 98.9  Celsius 37.1  Respirations Respirations 20  Systolic BP Systolic BP 004  Diastolic BP (mmHg) Diastolic BP (mmHg) 58  Mean BP 81  Pulse Ox % Pulse Ox % 95  Pulse Ox Activity Level  At rest  Oxygen Delivery Room Air/ 21 %  *Intake and Output.:   02-Feb-16 09:33  Grand Totals Intake:  0 Output:  0    Net:  0 24 Hr.:  0   Weight Type admission  Weight Method Bed  Current Weight (lbs) (lbs) 226.6  Current Weight (kg) (kg) 102.7  Height (ft) (feet) 6  Height (in) (in) 2  Height (cm) centimeters 187.9  BSA (m2) 2.2  BMI (kg/m2) 29    Impression 81 year old male with history of nonischemic cardiomyopathy, cardiac catheterization October 2010 showing no significant CAD, ejection fraction 45% at that time, with notes indicating ejection fraction less than 30% at [facility] at the end of 2013, atrial fibrillation/flutter, type 2 diabetes, previously seen at [facility], started on anticoagulation for atrial fibrillation several years ago, ICD placement December 2013, several cardiac catheterizations in 2014 one in January and one in May, by his report with several ICD shocks in October 2014,??most recent nuclear stress test 06/2013 without evidence of ischemia, history of bright red blood per rectum who presented to [facility] with couple month history of RUQ pain, reflux, and diarrhea and was found to have GB disease. Cardiology was consulted for pre-op evaluation.   1. Pre-op evaluation: -Patient is acceptable risk for non-cardiac surgical procedure at a moderate risk with an estimated rate of MI, PE, v-fib, cardiac arrest, complete heart block, or other arrhythmia at 6.6% per [facility] Criteria -Recent nuclear stress test 06/2013 without evidence of ischemia, Low risk study -He is uncertain when he last took his Xarelto (likely monday AM, wife could confirm by looking at pill box),  this has been held since his admission (would hold for 2 days pre-op) and restart when felt safe following surgery  (wednesday AM will be 48 hours from his last dose) Could potentially do surgery on Wednesday if schedule permits   2. Chronic systolic CHF s/p BiV ICD: -Limit IV fluids during operative period -Continue Coreg 12.5 mg bid, IV Lasix 20 bid per IM while in operative period   3. Permanent a-fib: -Xarelto on hold as  above -Limit IV fluids during operative period to prevent myocardial stretch  -Continue Digoxin 0.25 mg daily  4. DM2: -SSI per IM   Electronic Signatures: Rise Mu (PA-C)  (Signed 02-Feb-16 16:39)  Authored: General Aspect/Present Illness, History and Physical Exam, Review of System, Family & Social History, Past Medical History, Home Medications, Labs, EKG , Radiology, Allergies, Vital Signs/Nurse's Notes, Impression/Plan Ida Rogue (MD)  (Signed 02-Feb-16 19:43)  Authored: General Aspect/Present Illness, History and Physical Exam, Family & Social History, Past Medical History, Health Issues, EKG , Vital Signs/Nurse's Notes, Impression/Plan  Co-Signer: General Aspect/Present Illness, History and Physical Exam, Review of System, Family & Social History, Past Medical History, Home Medications, Labs, EKG , Radiology, Allergies, Vital Signs/Nurse's Notes, Impression/Plan   Last Updated: 02-Feb-16 19:43 by Ida Rogue (MD)

## 2014-06-19 NOTE — Discharge Summary (Signed)
PATIENT NAME:  Nicolas Morris, Nicolas Morris MR#:  [id number] DATE OF BIRTH:  1933/04/08  DATE OF ADMISSION:  05/29/2014 DATE OF DISCHARGE:  05/31/2014  For a detailed note, please take a look at the history and physical done on admission by Dr. Dimas Aguas.   DIAGNOSES AT DISCHARGE: Chest pain, atypical, with elevated troponin; congestive heart failure, acute on chronic; combined systolic and diastolic dysfunction; chronic atrial fibrillation; diabetes; diabetic neuropathy; chronic obstructive pulmonary disease; depression; hypertension.   DISCHARGE INSTRUCTIONS: The patient is being discharged with home health nursing and social work services.   ACTIVITY: As tolerated.   DIET: Low-sodium, low-fat, carbohydrate-controlled diet.   FOLLOWUP: With Dr. Mariah Milling in the next 1 to 2 weeks.   DISCHARGE MEDICATIONS: Meclizine 25 mg t.i.d.; Zoloft 100 mg daily; digoxin 250 mcg daily; potassium 12 mEq daily; Lasix 40 mg b.i.d.; Coreg 12.5 mg b.i.d.; Protonix 40 mg daily; trazodone 50 mg 4 tablets at bedtime; Xarelto 20 mg daily; lovastatin 20 mg daily; lisinopril 2.5 mg daily; gabapentin 300 mg t.i.d.; Glucophage 500 mg b.i.d.   CONSULTANTS DURING THE HOSPITAL COURSE: Julien Nordmann, MD from cardiology.   PERTINENT STUDIES DONE DURING THE HOSPITAL COURSE: A 2-dimensional echocardiogram done on April 11 showing EF of 50% to 55%, mild to moderate LVH, mild mitral valve regurgitation, mildly elevated pulmonary artery systolic pressure.   HOSPITAL COURSE: This is a 81 year old male with medical problems as mentioned above, presented to the hospital due to chest pain with an elevated troponin and also noted to be short of breath.   PROBLEM LIST: 1.  Chest pain with elevated troponin. The patient did have significant risk factors given his history of tobacco abuse, diabetes, and previous coronary artery disease; therefore, he was observed on telemetry, had 3 sets of cardiac markers checked, which were only mildly elevated. His  pain was very atypical and reproducible and, therefore, likely musculoskeletal in nature. A cardiology consult was obtained. They did not recommend any further intervention. They recommended continuing medical management with aspirin, beta blocker, and statin for now.  2.  Congestive heart failure. This was likely acute on chronic combined systolic and diastolic dysfunction. The patient had significant exertional shortness of breath, therefore was ambulated on room air, did not desaturate below 93%. The patient was diuresed with IV Lasix, has clinically improved. He is about 7.5 liters negative since admission. At this point, he will continue his home dose Lasix, Coreg, and ACE inhibitor as stated.  3.  COPD. The patient had no acute COPD exacerbation. He was maintained on some as needed, DuoNebs. He was ambulated on the room air, did not desaturate below 93%, and therefore did not qualify for home oxygen. He will continue his Spiriva as stated.  4.  History of chronic atrial fibrillation. The patient remained rate controlled on his Coreg which he will continue. He is already on Xarelto which he will continue upon discharge.  5.  Diabetes. The patient was maintained on some sliding scale insulin, but will resume his metformin upon discharge.  6.  Diabetic neuropathy. The patient was maintained on his Neurontin. He will resume that.  7.  Depression. The patient was maintained on his Zoloft and he will also resume that upon discharge.   CODE STATUS: The patient is a full code.   DISPOSITION: He is being discharged with home health nursing and social work services.   TIME SPENT: Forty minutes.    ____________________________ Rolly Pancake. Cherlynn Kaiser, MD vjs:TM D: 05/31/2014 16:16:28 ET T: 05/31/2014 22:12:10 ET JOB#:  [id number]  cc: Rolly Pancake. Cherlynn Kaiser, MD, <Dictator> Antonieta Iba, MD

## 2014-06-19 NOTE — H&P (Signed)
   Subjective/Chief Complaint Epigastric pain x 1 day   History of Present Illness 81 yo M with a history of DM and prior MI in 2013 (xarelto) who presents with 1 day of epigastric pain.  Has had prior episodes in past.  Was doing well before this.  No nausea/vomiting.  CT shows thickened GB wall and stranding, supported by U/S.  No sick contacts, no unusual ingestions.  No fevers/chills.   Past History DM MI 2013 ? stents HTN Pacemaker 5screws in left ankle Right rotator cuff   Past Medical Health Coronary Artery Disease, Hypertension, Diabetes Mellitus   Code Status Full Code   Past Med/Surgical Hx:  Diabetes:   HTN:   MI:   HI:   Pacemaker/ Defib.:   5 screws in left ankle:   Right rotator cuff:   ALLERGIES:  No Known Allergies:   Family and Social History:  Family History Coronary Artery Disease  Cancer   Social History negative tobacco, negative ETOH   Place of Living Home   Review of Systems:  Subjective/Chief Complaint Epigastric pain   Fever/Chills No   Cough No   Sputum No   Abdominal Pain Yes   Diarrhea No   Constipation No   Nausea/Vomiting No   SOB/DOE No   Dysuria No   Tolerating PT No   Tolerating Diet No   Physical Exam:  GEN well developed, well nourished, no acute distress   HEENT pink conjunctivae, PERRL, hearing intact to voice, good dentition   RESP normal resp effort  clear BS   CARD regular rate  no murmur  no thrills  No LE edema   ABD positive tenderness  Epigastric tenderness   LYMPH negative neck, negative axillae   EXTR negative cyanosis/clubbing, negative edema   SKIN normal to palpation, No rashes, No ulcers   NEURO cranial nerves intact, negative rigidity, negative tremor, follows commands, motor/sensory function intact   PSYCH good insight    Assessment/Admission Diagnosis 81 yo M with history of MI/DM who presents with s/sx suggestive of cholecystitis.   Plan Admit, IVF, IV abx, diet for now.  Will  await xarelto to wear off.  Will obtain cards for clearance, IM for assistance with DM.   Electronic Signatures: Jarvis Newcomer (MD)  (Signed [phone] 09:04)  Authored: CHIEF COMPLAINT and HISTORY, PAST MEDICAL/SURGIAL HISTORY, ALLERGIES, FAMILY AND SOCIAL HISTORY, REVIEW OF SYSTEMS, PHYSICAL EXAM, ASSESSMENT AND PLAN   Last Updated: 02-Feb-16 09:04 by Jarvis Newcomer (MD)

## 2014-06-19 NOTE — Consult Note (Signed)
PATIENT NAME:  Nicolas Morris, Nicolas Morris MR#:  [id number] DATE OF BIRTH:  July 20, 1933  DATE OF CONSULTATION:  03/22/2014  REFERRING PHYSICIAN:   CONSULTING PHYSICIAN:  Curby Carswell H. Allena Katz, MD  CONSULT REQUESTED BY: Dr. Juliann Pulse.   PRIMARY CARE PROVIDER: Unknown. The patient is not sure who his primary care provider is.   REASON FOR CONSULTATION: Diabetic management, anemia, hypertension, possible congestive heart failure.   HISTORY OF PRESENT ILLNESS: The patient is a 81 year old, white male, with multiple medical problems, who was admitted by Dr. Juliann Pulse earlier today for possible cholecystitis due to abdominal pain for 1 day duration. The patient reports that he started having acute onset of abdominal pain since yesterday. He is not having any fevers or chills. Denies any nausea or vomiting. He does complain of intermittent shortness of breath with dyspnea on exertion, but denies any chest pain. The patient otherwise denies any diarrhea. No blood in the stool. He does have a history of anemia requiring transfusion in the past.   PAST MEDICAL HISTORY:  1. Diabetes.  2. Hypertension.  3. History of coronary artery disease.  4. History of chronic systolic congestive heart failure with an EF of 45% to 50%.  5. History of atrial fibrillation on chronic anticoagulation. 6. Questionable history of cirrhosis, this is per chart. 7. History of coronary artery disease status post MI in December 2013.   HOME MEDICATIONS: Xarelto 20 mg once a day in the evening, trazodone 200 mg at bedtime, sertraline 100 daily, Protonix 40 daily, potassium chloride 20 mEq daily, metformin 500 one tablet p.o. b.i.d., meclizine 25 one tablet p.o. t.i.d., lovastatin 20 daily, gabapentin 300 mg 1 tablet p.o. b.i.d., Lasix 40 mg 1 tablet p.o. b.i.d., digoxin 250 mcg 1 tablet p.o. daily, carvedilol 12.5 mg 1 tablet p.o. b.i.d., aspirin 81 one tablet p.o. daily.  ALLERGIES: None.   PAST SURGICAL HISTORY: Status post pacemaker  defibrillator insertion, December 2013.   SOCIAL HISTORY: Used to smoke, but quit. Denies any alcohol or illicit drug use.   FAMILY HISTORY: Positive for coronary artery disease.   REVIEW OF SYSTEMS:    CONSTITUTIONAL: Denies any fevers, chills. No weight loss. No weight gain.  HEENT: Denies any blurred vision, double vision. No cataracts. No glaucoma. No nasal epistaxis. No seasonal allergies. No difficulty swallowing. He denies tinnitus, hearing loss.  CARDIOVASCULAR: Complains of some dyspnea on exertion. No chest pain, history of coronary artery disease, history of atrial fibrillation. No syncope.  PULMONARY: Denies any cough, wheezing, hemoptysis.  GASTROINTESTINAL: Complains of epigastric pain. No nausea, no vomiting. No diarrhea.  GENITOURINARY: Denies any frequency, urgency or hesitancy.  ENDOCRINE: Denies any polydipsia or polyphagia.  SKIN: No rash.  LYMPH NODES: No lymph node enlargement.  VASCULAR: Denies any claudication symptoms.  PSYCHIATRIC: Denies any anxiety or insomnia.  NEUROLOGIC: Denies CVA, TIA, or seizures.   PHYSICAL EXAMINATION:  VITAL SIGNS: Temperature 98.9, pulse 85, respirations 20, blood pressure 127/58, O2 is 95%.  GENERAL: The patient is an obese male in no acute distress.  HEENT: Head atraumatic, normocephalic. Pupils equally round, reactive to light and accommodation. There is no conjunctival pallor, no scleral icterus. Nasal examination shows no drainage or ulceration. Oropharynx is clear without any exudate.  NECK: Supple without any thyromegaly.  CARDIOVASCULAR: Regular rate and rhythm. No murmurs, rubs, clicks, or gallops.  LUNGS: He has bilateral crackles at the bases. No wheezing, no accessory muscle usage.  ABDOMEN: Soft, nontender, nondistended currently.  EXTREMITIES: No clubbing, cyanosis, or edema.  SKIN: No rash.  LYMPH NODES: Nonpalpable.  VASCULAR: Good DP/PT pulses.  PSYCHIATRIC: Not anxious or depressed.  NEUROLOGIC: Awake, alert,  oriented x 3. No focal deficits.   LABORATORY DATA: Glucose 330, BUN 13, creatinine 1.16, sodium 135, potassium 3.6, chloride 100, CO2 is 24, BNP is 1997. LFTs are normal. Troponin less than 0.05. WBC 12.0, hemoglobin 7.9, platelet count is 300,000.  Chest x-ray suggestive of increased markings, possible interstitial edema.   ASSESSMENT AND PLAN: The patient is a 81 year old, white male with diabetes, coronary artery disease, possible liver cirrhosis, chronic anemia, who presents with abdominal pain and noted to have acute cholecystitis.   1. Diabetes. At this time, we will place him on sliding scale, hold metformin, in light of anticipated surgery and poor p.o. intake.  2. History of atrial fibrillation. We will hold Xarelto. Continue Coreg as taking at home.  3. History of systolic congestive heart failure with possible acute exacerbation. We will give IV Lasix, low-dose, b.i.d.  4. Anemia, history of colonoscopy in 2014 with internal hemorrhoids. If further hemoglobin drops, will need transfusion. We will guaiac his stools.  5. Acute cholecystitis, treatment per surgery. Cardiology clearance has been ordered by Dr. Juliann Pulse. We will await their evaluation.  6. Miscellaneous. Use sequential compression devices for deep vein thrombosis prophylaxis.   TIME SPENT ON THIS CONSULT:  50 minutes.   ____________________________ Lacie Scotts Allena Katz, MD shp:JT D: 03/23/2014 08:37:08 ET T: 03/23/2014 08:48:35 ET JOB#: [id number]  cc: Cipriana Biller H. Allena Katz, MD, <Dictator> Charise Carwin MD ELECTRONICALLY SIGNED 03/24/2014 12:37
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
## 2014-06-19 NOTE — Discharge Summary (Signed)
PATIENT NAME:  Nicolas Morris, Nicolas Morris MR#:  [id number] DATE OF BIRTH:  03/19/1933  DATE OF ADMISSION:  03/22/2014 DATE OF DISCHARGE:  03/26/2014  DISCHARGE DIAGNOSES:   1. Cholecystitis, status post laparoscopic cholecystectomy on 03/25/2014.  2. History of diabetes mellitus.  3. History of myocardial infarction in 2013 with stents.  4. Hypertension.  5. History of pacemaker.  6. History of ankle screws.  7. History of rotator cuff injury.   DISCHARGE MEDICATIONS:  Meclizine 25 mg p.o. t.i.d. p.r.n., metformin 500 p.o. b.i.d., sertraline 100 mg p.o. daily, digoxin 250 mcg p.o. daily, potassium chloride 20 mEq extended release p.o. daily, Lasix 40 p.o. b.i.d., carvedilol 12.5 p.o. b.i.d., Protonix 40 p.o. daily, trazodone 200 p.o. at bedtime, gabapentin 300 mg p.o. b.i.d., aspirin 81 p.o. daily, Xarelto 20 mg p.o. at bedtime, lovastatin 20 mg p.o. daily, carvedilol 12.5 p.o. b.i.d., Norco 1-2 tabs p.o. q. 4 hours p.r.n. pain.   INDICATION FOR ADMISSION: [name] is a pleasant 81 year old male who presents with right upper quadrant pain, nausea, vomiting, who had signs and symptoms suggestive of cholecystitis. He was admitted for management of this.   HOSPITAL COURSE: [name] was admitted from the ED, he had cardiology consult who said he was moderate risk. For the next 3 days I waited for his Xarelto to wear off.  On February 5 he underwent unremarkable cholecystectomy. On the following day he was advanced to a regular diet and given p.o. pain medications and discharge in satisfactory condition on his Xarelto.   DISCHARGE INSTRUCTIONS: [name] is to call or return to the ED if he has increased pain, nausea, vomiting, redness, drainage from incision. He is to follow up with me in approximately 1 week.    ____________________________ Si Raider. Mystic Labo, MD cal:bu D: 04/04/2014 19:36:49 ET T: 04/04/2014 19:51:48 ET JOB#: [id number]  cc: Cristal Deer A. Aman Bonet, MD, <Dictator> Jarvis Newcomer MD ELECTRONICALLY SIGNED 04/07/2014 22:27

## 2014-06-20 ENCOUNTER — Encounter: Payer: Medicare Other | Admitting: Cardiovascular Disease

## 2014-06-20 ENCOUNTER — Telehealth: Payer: Self-pay | Admitting: *Deleted

## 2014-06-20 NOTE — Telephone Encounter (Signed)
lmov to see if patient would come in on the 4th of this week, to do his hospital follow up

## 2014-06-26 ENCOUNTER — Other Ambulatory Visit: Payer: Self-pay

## 2014-06-26 ENCOUNTER — Encounter: Payer: Self-pay | Admitting: General Practice

## 2014-06-26 ENCOUNTER — Emergency Department
Admission: EM | Admit: 2014-06-26 | Discharge: 2014-06-26 | Disposition: A | Discharge status: Home / Self Care | Payer: Medicare Other | Attending: Emergency Medicine | Admitting: Emergency Medicine

## 2014-06-26 ENCOUNTER — Emergency Department: Payer: Medicare Other

## 2014-06-26 ENCOUNTER — Encounter: Payer: Self-pay | Admitting: Emergency Medicine

## 2014-06-26 DIAGNOSIS — Y998 Other external cause status: Secondary | ICD-10-CM | POA: Insufficient documentation

## 2014-06-26 DIAGNOSIS — W1839XD Other fall on same level, subsequent encounter: Secondary | ICD-10-CM | POA: Insufficient documentation

## 2014-06-26 DIAGNOSIS — E119 Type 2 diabetes mellitus without complications: Secondary | ICD-10-CM | POA: Insufficient documentation

## 2014-06-26 DIAGNOSIS — S0093XA Contusion of unspecified part of head, initial encounter: Secondary | ICD-10-CM | POA: Diagnosis not present

## 2014-06-26 DIAGNOSIS — Z87891 Personal history of nicotine dependence: Secondary | ICD-10-CM | POA: Insufficient documentation

## 2014-06-26 DIAGNOSIS — W01198A Fall on same level from slipping, tripping and stumbling with subsequent striking against other object, initial encounter: Secondary | ICD-10-CM | POA: Diagnosis not present

## 2014-06-26 DIAGNOSIS — S01311A Laceration without foreign body of right ear, initial encounter: Secondary | ICD-10-CM | POA: Diagnosis present

## 2014-06-26 DIAGNOSIS — Y9289 Other specified places as the place of occurrence of the external cause: Secondary | ICD-10-CM | POA: Diagnosis not present

## 2014-06-26 DIAGNOSIS — S01311D Laceration without foreign body of right ear, subsequent encounter: Secondary | ICD-10-CM

## 2014-06-26 DIAGNOSIS — R2681 Unsteadiness on feet: Secondary | ICD-10-CM | POA: Insufficient documentation

## 2014-06-26 DIAGNOSIS — I10 Essential (primary) hypertension: Secondary | ICD-10-CM | POA: Insufficient documentation

## 2014-06-26 DIAGNOSIS — T148XXA Other injury of unspecified body region, initial encounter: Secondary | ICD-10-CM

## 2014-06-26 DIAGNOSIS — Y838 Other surgical procedures as the cause of abnormal reaction of the patient, or of later complication, without mention of misadventure at the time of the procedure: Secondary | ICD-10-CM | POA: Diagnosis not present

## 2014-06-26 DIAGNOSIS — Z79899 Other long term (current) drug therapy: Secondary | ICD-10-CM | POA: Insufficient documentation

## 2014-06-26 DIAGNOSIS — Z7901 Long term (current) use of anticoagulants: Secondary | ICD-10-CM | POA: Diagnosis not present

## 2014-06-26 DIAGNOSIS — IMO0002 Reserved for concepts with insufficient information to code with codable children: Secondary | ICD-10-CM

## 2014-06-26 DIAGNOSIS — L7622 Postprocedural hemorrhage and hematoma of skin and subcutaneous tissue following other procedure: Secondary | ICD-10-CM | POA: Diagnosis not present

## 2014-06-26 DIAGNOSIS — Y9389 Activity, other specified: Secondary | ICD-10-CM | POA: Diagnosis not present

## 2014-06-26 DIAGNOSIS — S161XXA Strain of muscle, fascia and tendon at neck level, initial encounter: Secondary | ICD-10-CM | POA: Insufficient documentation

## 2014-06-26 DIAGNOSIS — Z7982 Long term (current) use of aspirin: Secondary | ICD-10-CM | POA: Diagnosis not present

## 2014-06-26 DIAGNOSIS — W19XXXA Unspecified fall, initial encounter: Secondary | ICD-10-CM

## 2014-06-26 HISTORY — DX: Essential (primary) hypertension: I10

## 2014-06-26 LAB — COMPREHENSIVE METABOLIC PANEL
ALT: 14 U/L — AB (ref 17–63)
AST: 26 U/L (ref 15–41)
Albumin: 3.6 g/dL (ref 3.5–5.0)
Alkaline Phosphatase: 73 U/L (ref 38–126)
Anion gap: 10 (ref 5–15)
BUN: 19 mg/dL (ref 6–20)
CALCIUM: 9.3 mg/dL (ref 8.9–10.3)
CO2: 29 mmol/L (ref 22–32)
CREATININE: 0.97 mg/dL (ref 0.61–1.24)
Chloride: 96 mmol/L — ABNORMAL LOW (ref 101–111)
GFR calc Af Amer: >60 mL/min (ref >60)
Glucose, Bld: 126 mg/dL — ABNORMAL HIGH (ref 65–99)
Potassium: 4.4 mmol/L (ref 3.5–5.1)
Sodium: 135 mmol/L (ref 135–145)
Total Bilirubin: 0.6 mg/dL (ref 0.3–1.2)
Total Protein: 7.5 g/dL (ref 6.5–8.1)

## 2014-06-26 LAB — CBC WITH DIFFERENTIAL/PLATELET
BASOS ABS: 0 10*3/uL (ref 0–0.1)
Basophils Relative: 0 %
EOS ABS: 0.1 10*3/uL (ref 0–0.7)
Eosinophils Relative: 1 %
HEMATOCRIT: 28.4 % — AB (ref 40.0–52.0)
HEMOGLOBIN: 8.5 g/dL — AB (ref 13.0–18.0)
Lymphocytes Relative: 26 %
Lymphs Abs: 2.1 10*3/uL (ref 1.0–3.6)
MCH: 17.6 pg — AB (ref 26.0–34.0)
MCHC: 29.9 g/dL — AB (ref 32.0–36.0)
MCV: 58.7 fL — AB (ref 80.0–100.0)
MONO ABS: 0.9 10*3/uL (ref 0.2–1.0)
MONOS PCT: 11 %
Neutro Abs: 5 10*3/uL (ref 1.4–6.5)
Neutrophils Relative %: 62 %
Platelets: 337 10*3/uL (ref 150–440)
RBC: 4.84 MIL/uL (ref 4.40–5.90)
RDW: 19.2 % — ABNORMAL HIGH (ref 11.5–14.5)
WBC: 8.1 10*3/uL (ref 3.8–10.6)

## 2014-06-26 MED ORDER — MICROFIBRILLAR COLL HEMOSTAT EX POWD
CUTANEOUS | Status: AC
  Filled 2014-06-26: qty 5

## 2014-06-26 MED ORDER — MICROFIBRILLAR COLL HEMOSTAT EX POWD
1.0000 g | Freq: Once | CUTANEOUS | Status: AC
  Administered 2014-06-26: 12:00:00 via TOPICAL

## 2014-06-26 MED ORDER — LIDOCAINE-EPINEPHRINE (PF) 1 %-1:200000 IJ SOLN
INTRAMUSCULAR | Status: AC
  Filled 2014-06-26: qty 30

## 2014-06-26 NOTE — ED Notes (Signed)
Pt is seated on the side of the bed, no distress noted, pt waiting for transport, cont to monitor

## 2014-06-26 NOTE — Discharge Instructions (Signed)
Your head CT and your cervical spine CT did not show any acute injury. Review expect to remains sore for a number of days. The small laceration behind your right ear would not stop bleeding initially. This was able to be controlled better with direct piece of gauze behind-the-ear and a pressure dressing. Leave this pressure dressing in place until this afternoon (Sunday). If he did start bleeding again or have any other concerns return to the emergency department.  Cervical Sprain A cervical sprain is an injury in the neck in which the strong, fibrous tissues (ligaments) that connect your neck bones stretch or tear. Cervical sprains can range from mild to severe. Severe cervical sprains can cause the neck vertebrae to be unstable. This can lead to damage of the spinal cord and can result in serious nervous system problems. The amount of time it takes for a cervical sprain to get better depends on the cause and extent of the injury. Most cervical sprains heal in 1 to 3 weeks. CAUSES  Severe cervical sprains may be caused by:   Contact sport injuries (such as from football, rugby, wrestling, hockey, auto racing, gymnastics, diving, martial arts, or boxing).   Motor vehicle collisions.   Whiplash injuries. This is an injury from a sudden forward and backward whipping movement of the head and neck.  Falls.  Mild cervical sprains may be caused by:   Being in an awkward position, such as while cradling a telephone between your ear and shoulder.   Sitting in a chair that does not offer proper support.   Working at a poorly Marketing executive station.   Looking up or down for long periods of time.  SYMPTOMS   Pain, soreness, stiffness, or a burning sensation in the front, back, or sides of the neck. This discomfort may develop immediately after the injury or slowly, 24 hours or more after the injury.   Pain or tenderness directly in the middle of the back of the neck.   Shoulder or  upper back pain.   Limited ability to move the neck.   Headache.   Dizziness.   Weakness, numbness, or tingling in the hands or arms.   Muscle spasms.   Difficulty swallowing or chewing.   Tenderness and swelling of the neck.  DIAGNOSIS  Most of the time your health care provider can diagnose a cervical sprain by taking your history and doing a physical exam. Your health care provider will ask about previous neck injuries and any known neck problems, such as arthritis in the neck. X-rays may be taken to find out if there are any other problems, such as with the bones of the neck. Other tests, such as a CT scan or MRI, may also be needed.  TREATMENT  Treatment depends on the severity of the cervical sprain. Mild sprains can be treated with rest, keeping the neck in place (immobilization), and pain medicines. Severe cervical sprains are immediately immobilized. Further treatment is done to help with pain, muscle spasms, and other symptoms and may include:  Medicines, such as pain relievers, numbing medicines, or muscle relaxants.   Physical therapy. This may involve stretching exercises, strengthening exercises, and posture training. Exercises and improved posture can help stabilize the neck, strengthen muscles, and help stop symptoms from returning.  HOME CARE INSTRUCTIONS   Put ice on the injured area.   Put ice in a plastic bag.   Place a towel between your skin and the bag.   Leave the ice on  for 15-20 minutes, 3-4 times a day.   If your injury was severe, you may have been given a cervical collar to wear. A cervical collar is a two-piece collar designed to keep your neck from moving while it heals.  Do not remove the collar unless instructed by your health care provider.  If you have long hair, keep it outside of the collar.  Ask your health care provider before making any adjustments to your collar. Minor adjustments may be required over time to improve  comfort and reduce pressure on your chin or on the back of your head.  Ifyou are allowed to remove the collar for cleaning or bathing, follow your health care provider's instructions on how to do so safely.  Keep your collar clean by wiping it with mild soap and water and drying it completely. If the collar you have been given includes removable pads, remove them every 1-2 days and hand wash them with soap and water. Allow them to air dry. They should be completely dry before you wear them in the collar.  If you are allowed to remove the collar for cleaning and bathing, wash and dry the skin of your neck. Check your skin for irritation or sores. If you see any, tell your health care provider.  Do not drive while wearing the collar.   Only take over-the-counter or prescription medicines for pain, discomfort, or fever as directed by your health care provider.   Keep all follow-up appointments as directed by your health care provider.   Keep all physical therapy appointments as directed by your health care provider.   Make any needed adjustments to your workstation to promote good posture.   Avoid positions and activities that make your symptoms worse.   Warm up and stretch before being active to help prevent problems.  SEEK MEDICAL CARE IF:   Your pain is not controlled with medicine.   You are unable to decrease your pain medicine over time as planned.   Your activity level is not improving as expected.  SEEK IMMEDIATE MEDICAL CARE IF:   You develop any bleeding.  You develop stomach upset.  You have signs of an allergic reaction to your medicine.   Your symptoms get worse.   You develop new, unexplained symptoms.   You have numbness, tingling, weakness, or paralysis in any part of your body.  MAKE SURE YOU:   Understand these instructions.  Will watch your condition.  Will get help right away if you are not doing well or get worse. Document Released:  12/02/2006 Document Revised: 02/09/2013 Document Reviewed: 08/12/2012 [facility] Patient Information 2015 [place], [place]. This information is not intended to replace advice given to you by your health care provider. Make sure you discuss any questions you have with your health care provider.  Laceration Care, Adult A laceration is a cut or lesion that goes through all layers of the skin and into the tissue just beneath the skin. TREATMENT  Some lacerations may not require closure. Some lacerations may not be able to be closed due to an increased risk of infection. It is important to see your caregiver as soon as possible after an injury to minimize the risk of infection and maximize the opportunity for successful closure. If closure is appropriate, pain medicines may be given, if needed. The wound will be cleaned to help prevent infection. Your caregiver will use stitches (sutures), staples, wound glue (adhesive), or skin adhesive strips to repair the laceration. These tools bring  the skin edges together to allow for faster healing and a better cosmetic outcome. However, all wounds will heal with a scar. Once the wound has healed, scarring can be minimized by covering the wound with sunscreen during the day for 1 full year. HOME CARE INSTRUCTIONS  For sutures or staples:  Keep the wound clean and dry.  If you were given a bandage (dressing), you should change it at least once a day. Also, change the dressing if it becomes wet or dirty, or as directed by your caregiver.  Wash the wound with soap and water 2 times a day. Rinse the wound off with water to remove all soap. Pat the wound dry with a clean towel.  After cleaning, apply a thin layer of the antibiotic ointment as recommended by your caregiver. This will help prevent infection and keep the dressing from sticking.  You may shower as usual after the first 24 hours. Do not soak the wound in water until the sutures are removed.  Only take  over-the-counter or prescription medicines for pain, discomfort, or fever as directed by your caregiver.  Get your sutures or staples removed as directed by your caregiver. For skin adhesive strips:  Keep the wound clean and dry.  Do not get the skin adhesive strips wet. You may bathe carefully, using caution to keep the wound dry.  If the wound gets wet, pat it dry with a clean towel.  Skin adhesive strips will fall off on their own. You may trim the strips as the wound heals. Do not remove skin adhesive strips that are still stuck to the wound. They will fall off in time. For wound adhesive:  You may briefly wet your wound in the shower or bath. Do not soak or scrub the wound. Do not swim. Avoid periods of heavy perspiration until the skin adhesive has fallen off on its own. After showering or bathing, gently pat the wound dry with a clean towel.  Do not apply liquid medicine, cream medicine, or ointment medicine to your wound while the skin adhesive is in place. This may loosen the film before your wound is healed.  If a dressing is placed over the wound, be careful not to apply tape directly over the skin adhesive. This may cause the adhesive to be pulled off before the wound is healed.  Avoid prolonged exposure to sunlight or tanning lamps while the skin adhesive is in place. Exposure to ultraviolet light in the first year will darken the scar.  The skin adhesive will usually remain in place for 5 to 10 days, then naturally fall off the skin. Do not pick at the adhesive film. You may need a tetanus shot if:  You cannot remember when you had your last tetanus shot.  You have never had a tetanus shot. If you get a tetanus shot, your arm may swell, get red, and feel warm to the touch. This is common and not a problem. If you need a tetanus shot and you choose not to have one, there is a rare chance of getting tetanus. Sickness from tetanus can be serious. SEEK MEDICAL CARE IF:   You  have redness, swelling, or increasing pain in the wound.  You see a red line that goes away from the wound.  You have yellowish-white fluid (pus) coming from the wound.  You have a fever.  You notice a bad smell coming from the wound or dressing.  Your wound breaks open before or after sutures  have been removed.  You notice something coming out of the wound such as wood or glass.  Your wound is on your hand or foot and you cannot move a finger or toe. SEEK IMMEDIATE MEDICAL CARE IF:   Your pain is not controlled with prescribed medicine.  You have severe swelling around the wound causing pain and numbness or a change in color in your arm, hand, leg, or foot.  Your wound splits open and starts bleeding.  You have worsening numbness, weakness, or loss of function of any joint around or beyond the wound.  You develop painful lumps near the wound or on the skin anywhere on your body. MAKE SURE YOU:   Understand these instructions.  Will watch your condition.  Will get help right away if you are not doing well or get worse. Document Released: 02/04/2005 Document Revised: 04/29/2011 Document Reviewed: 07/31/2010 [facility] Patient Information 2015 [place]. This information is not intended to replace advice given to you by your health care provider. Make sure you discuss any questions you have with your health care provider.

## 2014-06-26 NOTE — ED Notes (Signed)
Pt. Arrived to ed via ems from [facility]. Reports pt was discharged today after maintaining bleeding from right ear laceration. Pt reports site ( right ear) started bleeding again this afternoon. Pt arrived holding pressure with a towel.  Pt. Alert and oriented.

## 2014-06-26 NOTE — ED Notes (Signed)
Pt here from assisted living for bleeding from behind the rt ear lobe, no distress noted, pt holding gauze behind the ear as instructed by ER Dr

## 2014-06-26 NOTE — ED Notes (Signed)
Re-dressed lac due to bleeding; changed pt into hospital gown as his pajama top was soaked in blood.

## 2014-06-26 NOTE — ED Notes (Signed)
Pt provided with urinal. Pt updated on progression of ct results.

## 2014-06-26 NOTE — ED Notes (Signed)
c collar removed per dr. Carollee Massed, additional dressing applied to ear.

## 2014-06-26 NOTE — ED Notes (Signed)
Pt given d/c instructions and copies sent back to care facility, no signature obtained, pt verbalized understanding

## 2014-06-26 NOTE — ED Notes (Addendum)
Pt states was rinsing out a urinal in the shower when he lost his footing and fell. Pt with laceration to posterior right ear. Pt complains of neck pain, rigid c collar applied on .

## 2014-06-26 NOTE — ED Provider Notes (Addendum)
[facility] Emergency Department Provider Note  ____________________________________________  Time seen: 62  I have reviewed the triage vital signs and the nursing notes.   HISTORY  Chief Complaint Ear Laceration  fall, neck pain    HPI Nicolas Morris is a 81 y.o. male who lives in an assisted living setting. He reports he was rinsing out a urinal in the shower when he slipped and fell this past evening. When he fell he hit the right side of his head and neck. This led to bleeding which came from just underneath the earlobe. The patient complains of neck pain. He denies loss of consciousness. Of note is that he does take Xarelto.    Past Medical History  Diagnosis Date  . Non-obstructive CAD     a. s/p multiple caths @ [facility] - last reportedly 06/2012;  b. 06/2013 Lexi MV: EF 51%, no ischemia/infarct.  Marland Kitchen NICM (nonischemic cardiomyopathy)     a. EF reportedly < 30% 01/2013 ([facility]);  b. 01/2012 s/p MDT DTBB1D1 Viva S CRT-D;  c. 12/2012 Echo: EF 45-50%, inf/post HK, nl RV, mild MR.  . Diabetes mellitus without complication   . A-fib     a. Permanent, on Xarelto.  . H/O: GI bleed     a. 12/2012 EGD: HH, evidence of reflux. Erosive Gastritis. Nl duodenum;  b. 12/2012 Colonoscopy: Sev sigmoid and desc colon diverticulosis, transverse and ascending colon diverticulosis, nonbleeding internal hemorrhoids, no bleeding.  . Iron deficiency anemia   . Biventricular ICD (implantable cardioverter-defibrillator) -Medtronic 01/12/2013    a. 01/2012 [facility]:  01/2012 s/p MDT DTBB1D1 Viva S CRT-D.  Atrial port capped (afib).  . Diverticulosis     a. 12/2012 noted on colonoscopy.  . Hiatal hernia     a. 12/2012 noted on EGD.  Marland Kitchen Gastritis     a. 12/2012 noted on EGD.  Marland Kitchen Hypertension     Patient Active Problem List   Diagnosis Date Noted  . Unsteady gait 03/04/2014  . NICM (nonischemic cardiomyopathy) 07/20/2013  . Diabetes mellitus without complication   .  Diverticulosis   . Hiatal hernia   . Non-obstructive CAD   . Neck pain 04/21/2013  . Depression 01/26/2013  . Atrial fibrillation-permanent 01/12/2013  . Biventricular ICD (implantable cardioverter-defibrillator) -Medtronic 01/12/2013  . Congestive dilated cardiomyopathy 01/12/2013  . GI bleed 01/12/2013  . Chronic combined systolic and diastolic CHF (congestive heart failure) 01/12/2013  . Biventricular ICD (implantable cardioverter-defibrillator) -Medtronic 01/12/2013    Past Surgical History  Procedure Laterality Date  . Cardiac catheterization  2013    [facility]   . Cardiac catheterization  2011  . Defib implant  01/27/2012    Serial # [id number] H Model # [id number]  . Pacemaker insertion  01/27/2012  . Colonoscopy  32014  . Upper gastrointestinal endoscopy  2014  . Fetal blood transfusion  2014  . Cardiac defibrillator placement      Current Outpatient Rx  Name  Route  Sig  Dispense  Refill  . acetaminophen (TYLENOL) 325 MG tablet   Oral   Take 650 mg by mouth every 4 (four) hours as needed.         Marland Kitchen aspirin 81 MG tablet   Oral   Take 81 mg by mouth daily.         . carvedilol (COREG) 6.25 MG tablet   Oral   Take 2 tablets (12.5 mg total) by mouth 2 (two) times daily with a meal.         .  digoxin (LANOXIN) 0.25 MG tablet   Oral   Take 0.25 mg by mouth daily.         . furosemide (LASIX) 40 MG tablet   Oral   Take 1 tablet (40 mg total) by mouth 2 (two) times daily.   30 tablet   3   . gabapentin (NEURONTIN) 300 MG capsule   Oral   Take 300 mg by mouth 3 (three) times daily.          Marland Kitchen lisinopril (PRINIVIL,ZESTRIL) 2.5 MG tablet   Oral   Take 2.5 mg by mouth daily.         Marland Kitchen lovastatin (MEVACOR) 20 MG tablet   Oral   Take 20 mg by mouth at bedtime.          . meclizine (ANTIVERT) 25 MG tablet   Oral   Take 25 mg by mouth 3 (three) times daily as needed for dizziness.         . metFORMIN (GLUCOPHAGE) 500 MG tablet   Oral   Take by mouth  2 (two) times daily with a meal.         . pantoprazole (PROTONIX) 40 MG tablet   Oral   Take 40 mg by mouth daily.          . potassium chloride (MICRO-K) 10 MEQ CR capsule   Oral   Take 20 mEq by mouth daily.          . Rivaroxaban (XARELTO) 20 MG TABS tablet   Oral   Take 20 mg by mouth daily with supper.         . sertraline (ZOLOFT) 100 MG tablet   Oral   Take 100 mg by mouth daily.         . traZODone (DESYREL) 50 MG tablet   Oral   Take 200 mg by mouth at bedtime.            Allergies No known allergies  Family History  Problem Relation Age of Onset  . Family history unknown: Yes    Social History History  Substance Use Topics  . Smoking status: Former Smoker -- 3.00 packs/day for 45 years    Types: Cigarettes    Quit date: 01/13/2003  . Smokeless tobacco: Not on file  . Alcohol Use: No    Review of Systems Constitutional: Negative for fever. ENT: Negative for sore throat. Cardiovascular: Negative for chest pain. Respiratory: Negative for shortness of breath. Gastrointestinal: Negative for abdominal pain, vomiting and diarrhea. Genitourinary: Negative for dysuria. Musculoskeletal: Negative for back pain. Skin: Notable for laceration to right head. See history of present illness Neurological: Negative for headaches   10-point ROS otherwise negative.  ____________________________________________   PHYSICAL EXAM:  VITAL SIGNS: ED Triage Vitals  Enc Vitals Group     BP 06/26/14 0323 126/76 mmHg     Pulse Rate 06/26/14 0323 77     Resp 06/26/14 0323 14     Temp 06/26/14 0323 98.6 F (37 C)     Temp Source 06/26/14 0323 Oral     SpO2 06/26/14 0323 98 %     Weight 06/26/14 0323 230 lb (104.327 kg)     Height 06/26/14 0323  (1.88 m)     Head Cir --      Peak Flow --      Pain Score 06/26/14 0325 7     Pain Loc --      Pain Edu? --  Excl. in GC? --     Constitutional: Alert and oriented older-appearing gentleman.  Patient is communicative.  ENT   Head: Normocephalic.  There is bleeding coming from underneath the right earlobe. This blood has trickled down over his neck as well. There is no soft spots or crepitus.   Nose: No congestion/rhinnorhea.   Mouth/Throat: Mucous membranes are moist. Cardiovascular: Normal rate, regular rhythm. Respiratory: Normal respiratory effort without tachypnea. Breath sounds are clear and equal bilaterally. No wheezes/rales/rhonchi. Gastrointestinal: Soft and nontender. No distention.  Back: There is no CVA tenderness. Musculoskeletal: Nontender with normal range of motion in all extremities.  No noted edema. Neurologic:  Normal speech and language. No gross focal neurologic deficits are appreciated.  Skin:  Small irregular laceration hidden underneath the earlobe of his right ear. This has an ongoing slow steady trickle of blood. Psychiatric: Intact thought process.  ____________________________________________    LABS (pertinent positives/negatives)  Pending  ____________________________________________   EKG  At 7:50 AM ventricular paced rhythm with pacemaker. Rate of 75. No further interpretation.  ____________________________________________    RADIOLOGY  CT of head: Moderate atrophy, no acute findings.  CT of cervical spine: Moderate degenerative changes, no acute injury  ____________________________________________   PROCEDURES  Procedure(s) performed: None  Critical Care performed: No  ____________________________________________   INITIAL IMPRESSION / ASSESSMENT AND PLAN / ED COURSE  Mr. Astorino laceration is complicated by the fact that he is on Xarelto and habits a slow steady trickle of blood continuing. He had been wrapped with a full pressure dressing around the head including Surgicel to try to stop the bleeding area on my exam I unwrapped this dressing and found the Surgicel not in the right position. With the laceration  at the base of the tear where the helix attaches underneath the earlobe, I placed a folded over piece of gauze into that area and then rewrapped the dressing. This appeared to generate excellent hemostasis. The patient is alert and comfortable. His CT scans do not show any acute injury. Due to a question of weakness contributing to this fall we will draw blood at this time and check a blood count and metabolic panel. Presuming these are within reasonable levels the patient may be returned to his assisted living setting. I will last my colleague Dr. Langston Masker to follow up on these labs.  ____________________________________________   FINAL CLINICAL IMPRESSION(S) / ED DIAGNOSES  Final diagnoses:  Fall, initial encounter  Laceration  Contusion of head, initial encounter  Cervical strain, initial encounter      Darien Ramus, MD 06/26/14 1610  Darien Ramus, MD 06/26/14 0830

## 2014-06-26 NOTE — ED Provider Notes (Addendum)
[facility] Emergency Department Provider Note  ____________________________________________  Time seen: 4:15 PM  I have reviewed the triage vital signs and the nursing notes.   HISTORY  Chief Complaint Ear Laceration    HPI Nicolas Morris is a 81 y.o. male who returns to the ED from the [facility] for bleeding from her right ear wound. The patient was seen earlier today after he fell in the shower, sustaining a very small laceration posterior aspect of the right earlobe. He was controlled and the patient was returned back to the [facility]. However, upon arriving back. They changed the dressing, dislodging the clot in and causing it to rebleed. The patient is on Xarelto, so bleeding is difficult to control. The patient denies any other symptoms. There is no headache, lightheadedness, dizziness, chest pain, shortness of breath, abdominal pain, vomiting     Past Medical History  Diagnosis Date  . Non-obstructive CAD     a. s/p multiple caths @ [facility] - last reportedly 06/2012;  b. 06/2013 Lexi MV: EF 51%, no ischemia/infarct.  Marland Kitchen NICM (nonischemic cardiomyopathy)     a. EF reportedly < 30% 01/2013 ([facility]);  b. 01/2012 s/p MDT DTBB1D1 Viva S CRT-D;  c. 12/2012 Echo: EF 45-50%, inf/post HK, nl RV, mild MR.  . Diabetes mellitus without complication   . A-fib     a. Permanent, on Xarelto.  . H/O: GI bleed     a. 12/2012 EGD: HH, evidence of reflux. Erosive Gastritis. Nl duodenum;  b. 12/2012 Colonoscopy: Sev sigmoid and desc colon diverticulosis, transverse and ascending colon diverticulosis, nonbleeding internal hemorrhoids, no bleeding.  . Iron deficiency anemia   . Biventricular ICD (implantable cardioverter-defibrillator) -Medtronic 01/12/2013    a. 01/2012 [facility]:  01/2012 s/p MDT DTBB1D1 Viva S CRT-D.  Atrial port capped (afib).  . Diverticulosis     a. 12/2012 noted on colonoscopy.  . Hiatal hernia     a. 12/2012 noted on EGD.  Marland Kitchen Gastritis     a. 12/2012 noted  on EGD.  Marland Kitchen Hypertension     Patient Active Problem List   Diagnosis Date Noted  . Unsteady gait 03/04/2014  . NICM (nonischemic cardiomyopathy) 07/20/2013  . Diabetes mellitus without complication   . Diverticulosis   . Hiatal hernia   . Non-obstructive CAD   . Neck pain 04/21/2013  . Depression 01/26/2013  . Atrial fibrillation-permanent 01/12/2013  . Biventricular ICD (implantable cardioverter-defibrillator) -Medtronic 01/12/2013  . Congestive dilated cardiomyopathy 01/12/2013  . GI bleed 01/12/2013  . Chronic combined systolic and diastolic CHF (congestive heart failure) 01/12/2013  . Biventricular ICD (implantable cardioverter-defibrillator) -Medtronic 01/12/2013    Past Surgical History  Procedure Laterality Date  . Cardiac catheterization  2013    [facility]   . Cardiac catheterization  2011  . Defib implant  01/27/2012    Serial # [id number] H Model # [id number]  . Pacemaker insertion  01/27/2012  . Colonoscopy  32014  . Upper gastrointestinal endoscopy  2014  . Fetal blood transfusion  2014  . Cardiac defibrillator placement      Current Outpatient Rx  Name  Route  Sig  Dispense  Refill  . acetaminophen (TYLENOL) 325 MG tablet   Oral   Take 650 mg by mouth every 4 (four) hours as needed.         Marland Kitchen aspirin 81 MG tablet   Oral   Take 81 mg by mouth daily.         . carvedilol (COREG) 6.25 MG  tablet   Oral   Take 2 tablets (12.5 mg total) by mouth 2 (two) times daily with a meal.         . digoxin (LANOXIN) 0.25 MG tablet   Oral   Take 0.25 mg by mouth daily.         . furosemide (LASIX) 40 MG tablet   Oral   Take 1 tablet (40 mg total) by mouth 2 (two) times daily.   30 tablet   3   . gabapentin (NEURONTIN) 300 MG capsule   Oral   Take 300 mg by mouth 3 (three) times daily.          Marland Kitchen lisinopril (PRINIVIL,ZESTRIL) 2.5 MG tablet   Oral   Take 2.5 mg by mouth daily.         Marland Kitchen lovastatin (MEVACOR) 20 MG tablet   Oral   Take 20 mg by mouth at  bedtime.          . meclizine (ANTIVERT) 25 MG tablet   Oral   Take 25 mg by mouth 3 (three) times daily as needed for dizziness.         . metFORMIN (GLUCOPHAGE) 500 MG tablet   Oral   Take by mouth 2 (two) times daily with a meal.         . pantoprazole (PROTONIX) 40 MG tablet   Oral   Take 40 mg by mouth daily.          . potassium chloride (MICRO-K) 10 MEQ CR capsule   Oral   Take 20 mEq by mouth daily.          . Rivaroxaban (XARELTO) 20 MG TABS tablet   Oral   Take 20 mg by mouth daily with supper.         . sertraline (ZOLOFT) 100 MG tablet   Oral   Take 100 mg by mouth daily.         . traZODone (DESYREL) 50 MG tablet   Oral   Take 200 mg by mouth at bedtime.            Allergies No known allergies  Family History  Problem Relation Age of Onset  . Family history unknown: Yes    Social History History  Substance Use Topics  . Smoking status: Former Smoker -- 3.00 packs/day for 45 years    Types: Cigarettes    Quit date: 01/13/2003  . Smokeless tobacco: Not on file  . Alcohol Use: No    Review of Systems  Constitutional: No fever or chills. No weight changes Eyes:No blurry vision or double vision.  ENT: No sore throat. Cardiovascular: No chest pain. Respiratory: No dyspnea or cough. Gastrointestinal: Negative for abdominal pain, vomiting and diarrhea.  No BRBPR or melena. Genitourinary: Negative for dysuria, urinary retention, bloody urine, or difficulty urinating. Musculoskeletal: Negative for back pain. No joint swelling or pain. Skin: Negative for rash. Neurological: Negative for headaches, focal weakness or numbness. Psychiatric:No anxiety or depression.   Endocrine:No hot/cold intolerance, changes in energy, or sleep difficulty.  10-point ROS otherwise negative.  ____________________________________________   PHYSICAL EXAM:  VITAL SIGNS: ED Triage Vitals  Enc Vitals Group     BP 06/26/14 1621 111/61 mmHg      Pulse Rate 06/26/14 1621 90     Resp 06/26/14 1621 19     Temp 06/26/14 1621 98.8 F (37.1 C)     Temp Source 06/26/14 1621 Oral     SpO2 06/26/14 1621  98 %     Weight 06/26/14 1621 233 lb (105.688 kg)     Height 06/26/14 1621 6\' 2"  (1.88 m)     Head Cir --      Peak Flow --      Pain Score 06/26/14 1622 4     Pain Loc --      Pain Edu? --      Excl. in GC? --      Constitutional: Alert and oriented. Well appearing and in no distress. Eyes: No scleral icterus. No conjunctival pallor. PERRL. EOMI ENT   Head: Normocephalic with a small half centimeter laceration on the posterior aspect of the right earlobe. There is a constant slow trickle of bleeding from the wound.   Nose: No congestion/rhinnorhea. No septal hematoma   Mouth/Throat: MMM, no pharyngeal erythema   Neck: No stridor. No SubQ emphysema.  Hematological/Lymphatic/Immunilogical: No cervical lymphadenopathy.  Musculoskeletal: Nontender with normal range of motion in all extremities. No joint effusions.  No lower extremity tenderness.  No edema. Neurologic:   Normal speech and language.  CN 2-10 normal. Motor grossly intact.  ____________________________________________    LABS (pertinent positives/negatives) (all labs ordered are listed, but only abnormal results are displayed) Labs Reviewed - No data to display ____________________________________________   EKG    ____________________________________________    RADIOLOGY    ____________________________________________   PROCEDURES  ____________________________________________   INITIAL IMPRESSION / ASSESSMENT AND PLAN / ED COURSE  Pertinent labs & imaging results that were available during my care of the patient were reviewed by me and considered in my medical decision making (see chart for details).  The patient presents with uncontrolled bleeding from a very small wound on the right earlobe. On arrival, I held direct pressure for  5 minutes and then instructed the patient to hold direct pressure for another 20 minutes. I reevaluated the patient after that, and there was still persistent bleeding. At that time. I discussed with the patient using silver nitrate to cauterize the wound and he was amenable, especially since the area is not visible cosmetic outcome is not a concern for him.  With verbal consent obtained, at 4:50 PM I used once over nitrate stick and gently dab the wound, resulting in hemostasis. I placed 1 gauze pad on the area and had the patient hold direct pressure, again for another 30 minutes. On reassessment at 5:45 PM the wound remains hemostatic. I placed a nonadherent dressing and taped the ear down. The patient is instructed not to change the bandage for 2 days and to follow up with his primary care doctor or the ED for further concerns. He is medically stable and hemostatic at this time. Estimated blood loss is less than 5 mL   ____________________________________________   FINAL CLINICAL IMPRESSION(S) / ED DIAGNOSES  Final diagnoses:  Ear lobe laceration, right, subsequent encounter  Bleeding from wound      Sharman Cheek, MD 06/26/14 1800   ----------------------------------------- 6:31 PM on 06/26/2014 -----------------------------------------  While waiting for transport back home, and the patient's wound started oozing blood. Again, despite being cauterized and having a bandage in place. I proceeded to repair the wound with sutures for hemostasis. LACERATION REPAIR Performed by: Sharman Cheek Authorized by: Sharman Cheek Consent: Verbal consent obtained. Risks and benefits: risks, benefits and alternatives were discussed Consent given by: patient Patient identity confirmed: provided demographic data Prepped and Draped in normal sterile fashion Wound explored  Laceration Location: Right posterior ear lobe  Laceration Length: 1cm  No Foreign Bodies seen or  palpated  Anesthesia: local infiltration  Local anesthetic: lidocaine 2% with epinephrine  Anesthetic total: 1 ml  Irrigation method: syringe Amount of cleaning: standard  Skin closure: 6-0 Ethilon   Number of sutures: 4  Technique: Simple interrupted   Patient tolerance: Patient tolerated the procedure well with no immediate complications. Wound hemostatic. We'll place Surgicel and discharged home.   Sharman Cheek, MD 06/26/14 [id number]

## 2014-06-26 NOTE — Discharge Instructions (Signed)
Wound Care Wound care helps prevent pain and infection.  You may need a tetanus shot if:  You cannot remember when you had your last tetanus shot.  You have never had a tetanus shot.  The injury broke your skin. If you need a tetanus shot and you choose not to have one, you may get tetanus. Sickness from tetanus can be serious. HOME CARE   Only take medicine as told by your doctor.  Clean the wound daily with mild soap and water.  Change any bandages (dressings) as told by your doctor.  Put medicated cream and a bandage on the wound as told by your doctor.  Change the bandage if it gets wet, dirty, or starts to smell.  Take showers. Do not take baths, swim, or do anything that puts your wound under water.  Rest and raise (elevate) the wound until the pain and puffiness (swelling) are better.  Keep all doctor visits as told. GET HELP RIGHT AWAY IF:   Yellowish-white fluid (pus) comes from the wound.  Medicine does not lessen your pain.  There is a red streak going away from the wound.  You have a fever. MAKE SURE YOU:   Understand these instructions.  Will watch your condition.  Will get help right away if you are not doing well or get worse. Document Released: 11/14/2007 Document Revised: 04/29/2011 Document Reviewed: 06/10/2010 [facility] Patient Information 2015 [place], [place]. This information is not intended to replace advice given to you by your health care provider. Make sure you discuss any questions you have with your health care provider.  We got your ear wound to stop bleeding in the ER and placed a bandage on it. Do not remove this bandage for 2 days. The bandage may be changed on May 10. Continue taking all medications. Return to the ER if you have worsening pain, swelling, redness, drainage, or fever.

## 2014-06-26 NOTE — ED Notes (Signed)
Pt with saturation of dressing in place to right ear. Dr. Almira Bar notified, additional dressing placed over surgicel with control of bleeding at this time.

## 2014-06-26 NOTE — ED Provider Notes (Signed)
  Physical Exam  BP 138/64 mmHg  Pulse 79  Temp(Src) 98.6 F (37 C) (Oral)  Resp 18  Ht 6\' 2"  (1.88 m)  Wt 230 lb (104.327 kg)  BMI 29.52 kg/m2  SpO2 97%  Physical Exam Patient had persistent bleeding to the right ear laceration. After hemostatic agent and pressure the bleeding is controlled at 12:47 PM. Labs are reassuring and the patient will be discharged home. ED Course  Procedures        Arelia Longest, MD 06/26/14 1248

## 2014-06-29 ENCOUNTER — Telehealth: Payer: Self-pay | Admitting: *Deleted

## 2014-06-29 NOTE — Telephone Encounter (Signed)
lmov to see if pt can come in today at 2pm for visit.

## 2014-07-11 ENCOUNTER — Encounter: Payer: Medicare Other | Admitting: Cardiovascular Disease

## 2014-07-11 ENCOUNTER — Encounter: Payer: Self-pay | Admitting: *Deleted

## 2014-07-20 ENCOUNTER — Encounter: Payer: Medicare Other | Admitting: Cardiovascular Disease

## 2014-07-25 ENCOUNTER — Encounter: Payer: Medicare Other | Admitting: Nurse Practitioner

## 2014-07-25 ENCOUNTER — Encounter: Payer: Self-pay | Admitting: *Deleted

## 2014-08-03 ENCOUNTER — Ambulatory Visit: Payer: Self-pay

## 2014-08-18 NOTE — Telephone Encounter (Signed)
This encounter was created in error - please disregard.

## 2014-08-25 ENCOUNTER — Inpatient Hospital Stay
Admission: EM | Admit: 2014-08-25 | Discharge: 2014-08-30 | DRG: 378 | Payer: Medicare Other | Attending: Internal Medicine | Admitting: Internal Medicine

## 2014-08-25 ENCOUNTER — Emergency Department: Payer: Medicare Other

## 2014-08-25 DIAGNOSIS — D649 Anemia, unspecified: Secondary | ICD-10-CM | POA: Diagnosis present

## 2014-08-25 DIAGNOSIS — D62 Acute posthemorrhagic anemia: Secondary | ICD-10-CM | POA: Diagnosis present

## 2014-08-25 DIAGNOSIS — Z66 Do not resuscitate: Secondary | ICD-10-CM | POA: Diagnosis present

## 2014-08-25 DIAGNOSIS — Z95810 Presence of automatic (implantable) cardiac defibrillator: Secondary | ICD-10-CM

## 2014-08-25 DIAGNOSIS — Z800 Family history of malignant neoplasm of digestive organs: Secondary | ICD-10-CM | POA: Diagnosis not present

## 2014-08-25 DIAGNOSIS — Z7982 Long term (current) use of aspirin: Secondary | ICD-10-CM

## 2014-08-25 DIAGNOSIS — K922 Gastrointestinal hemorrhage, unspecified: Secondary | ICD-10-CM | POA: Diagnosis present

## 2014-08-25 DIAGNOSIS — I482 Chronic atrial fibrillation: Secondary | ICD-10-CM | POA: Diagnosis present

## 2014-08-25 DIAGNOSIS — K2990 Gastroduodenitis, unspecified, without bleeding: Secondary | ICD-10-CM | POA: Diagnosis present

## 2014-08-25 DIAGNOSIS — I5042 Chronic combined systolic (congestive) and diastolic (congestive) heart failure: Secondary | ICD-10-CM | POA: Diagnosis present

## 2014-08-25 DIAGNOSIS — E860 Dehydration: Secondary | ICD-10-CM | POA: Diagnosis present

## 2014-08-25 DIAGNOSIS — K449 Diaphragmatic hernia without obstruction or gangrene: Secondary | ICD-10-CM | POA: Diagnosis present

## 2014-08-25 DIAGNOSIS — F329 Major depressive disorder, single episode, unspecified: Secondary | ICD-10-CM | POA: Diagnosis present

## 2014-08-25 DIAGNOSIS — K5730 Diverticulosis of large intestine without perforation or abscess without bleeding: Secondary | ICD-10-CM | POA: Diagnosis present

## 2014-08-25 DIAGNOSIS — Z87891 Personal history of nicotine dependence: Secondary | ICD-10-CM | POA: Diagnosis not present

## 2014-08-25 DIAGNOSIS — I2510 Atherosclerotic heart disease of native coronary artery without angina pectoris: Secondary | ICD-10-CM | POA: Diagnosis present

## 2014-08-25 DIAGNOSIS — K645 Perianal venous thrombosis: Secondary | ICD-10-CM | POA: Diagnosis present

## 2014-08-25 DIAGNOSIS — R7989 Other specified abnormal findings of blood chemistry: Secondary | ICD-10-CM

## 2014-08-25 DIAGNOSIS — I420 Dilated cardiomyopathy: Secondary | ICD-10-CM | POA: Diagnosis present

## 2014-08-25 DIAGNOSIS — K2970 Gastritis, unspecified, without bleeding: Secondary | ICD-10-CM | POA: Diagnosis present

## 2014-08-25 DIAGNOSIS — R778 Other specified abnormalities of plasma proteins: Secondary | ICD-10-CM

## 2014-08-25 DIAGNOSIS — E871 Hypo-osmolality and hyponatremia: Secondary | ICD-10-CM

## 2014-08-25 DIAGNOSIS — D509 Iron deficiency anemia, unspecified: Secondary | ICD-10-CM | POA: Diagnosis present

## 2014-08-25 DIAGNOSIS — K644 Residual hemorrhoidal skin tags: Secondary | ICD-10-CM | POA: Diagnosis present

## 2014-08-25 DIAGNOSIS — I10 Essential (primary) hypertension: Secondary | ICD-10-CM | POA: Diagnosis present

## 2014-08-25 DIAGNOSIS — I248 Other forms of acute ischemic heart disease: Secondary | ICD-10-CM | POA: Diagnosis present

## 2014-08-25 DIAGNOSIS — Z791 Long term (current) use of non-steroidal anti-inflammatories (NSAID): Secondary | ICD-10-CM | POA: Diagnosis not present

## 2014-08-25 DIAGNOSIS — K219 Gastro-esophageal reflux disease without esophagitis: Secondary | ICD-10-CM | POA: Diagnosis present

## 2014-08-25 DIAGNOSIS — E119 Type 2 diabetes mellitus without complications: Secondary | ICD-10-CM | POA: Diagnosis present

## 2014-08-25 HISTORY — DX: Presence of automatic (implantable) cardiac defibrillator: Z95.810

## 2014-08-25 HISTORY — DX: Presence of cardiac pacemaker: Z95.0

## 2014-08-25 LAB — BASIC METABOLIC PANEL
Anion gap: 9 (ref 5–15)
BUN: 16 mg/dL (ref 6–20)
CO2: 25 mmol/L (ref 22–32)
Calcium: 9.1 mg/dL (ref 8.9–10.3)
Chloride: 97 mmol/L — ABNORMAL LOW (ref 101–111)
Creatinine, Ser: 0.97 mg/dL (ref 0.61–1.24)
GFR calc Af Amer: >60 mL/min (ref >60)
Glucose, Bld: 136 mg/dL — ABNORMAL HIGH (ref 65–99)
Potassium: 4.3 mmol/L (ref 3.5–5.1)
Sodium: 131 mmol/L — ABNORMAL LOW (ref 135–145)

## 2014-08-25 LAB — PROTIME-INR
INR: 1.26
Prothrombin Time: 16 seconds — ABNORMAL HIGH (ref 11.4–15.0)

## 2014-08-25 LAB — CBC
HEMATOCRIT: 20.1 % — AB (ref 40.0–52.0)
Hemoglobin: 5.7 g/dL — ABNORMAL LOW (ref 13.0–18.0)
MCH: 16.1 pg — AB (ref 26.0–34.0)
MCHC: 28.5 g/dL — AB (ref 32.0–36.0)
MCV: 56.4 fL — ABNORMAL LOW (ref 80.0–100.0)
Platelets: 288 10*3/uL (ref 150–440)
RBC: 3.57 MIL/uL — ABNORMAL LOW (ref 4.40–5.90)
RDW: 20.5 % — ABNORMAL HIGH (ref 11.5–14.5)
WBC: 10.5 10*3/uL (ref 3.8–10.6)

## 2014-08-25 LAB — DIGOXIN LEVEL: DIGOXIN LVL: 0.6 ng/mL — AB (ref 0.8–2.0)

## 2014-08-25 LAB — APTT: aPTT: 35 seconds (ref 24–36)

## 2014-08-25 LAB — PREPARE RBC (CROSSMATCH)

## 2014-08-25 LAB — BRAIN NATRIURETIC PEPTIDE: B NATRIURETIC PEPTIDE 5: 714 pg/mL — AB (ref 0.0–100.0)

## 2014-08-25 LAB — ABO/RH: ABO/RH(D): A POS

## 2014-08-25 MED ORDER — SODIUM CHLORIDE 0.9 % IJ SOLN
3.0000 mL | Freq: Two times a day (BID) | INTRAMUSCULAR | Status: DC
  Administered 2014-08-26 – 2014-08-30 (×9): 3 mL via INTRAVENOUS

## 2014-08-25 MED ORDER — SODIUM CHLORIDE 0.9 % IV SOLN: Freq: Once | INTRAVENOUS | Status: DC

## 2014-08-25 MED ORDER — FINASTERIDE 5 MG PO TABS
5.0000 mg | ORAL_TABLET | Freq: Every day | ORAL | Status: DC
  Administered 2014-08-26 – 2014-08-30 (×5): 5 mg via ORAL
  Filled 2014-08-25 (×5): qty 1

## 2014-08-25 MED ORDER — PANTOPRAZOLE SODIUM 40 MG IV SOLR
40.0000 mg | Freq: Two times a day (BID) | INTRAVENOUS | Status: DC
  Administered 2014-08-25 – 2014-08-30 (×9): 40 mg via INTRAVENOUS
  Filled 2014-08-25 (×9): qty 40

## 2014-08-25 MED ORDER — POTASSIUM CHLORIDE CRYS ER 20 MEQ PO TBCR: 10.0000 meq | EXTENDED_RELEASE_TABLET | Freq: Every day | ORAL | Status: DC

## 2014-08-25 MED ORDER — POTASSIUM CHLORIDE CRYS ER 20 MEQ PO TBCR: 20.0000 meq | EXTENDED_RELEASE_TABLET | Freq: Two times a day (BID) | ORAL | Status: DC

## 2014-08-25 MED ORDER — CARVEDILOL 12.5 MG PO TABS
12.5000 mg | ORAL_TABLET | Freq: Two times a day (BID) | ORAL | Status: DC
  Administered 2014-08-26 – 2014-08-30 (×8): 12.5 mg via ORAL
  Filled 2014-08-25 (×9): qty 1

## 2014-08-25 MED ORDER — ACETAMINOPHEN 500 MG PO TABS
500.0000 mg | ORAL_TABLET | Freq: Three times a day (TID) | ORAL | Status: DC
  Administered 2014-08-26 – 2014-08-30 (×12): 500 mg via ORAL
  Filled 2014-08-25 (×12): qty 1

## 2014-08-25 MED ORDER — LISINOPRIL 5 MG PO TABS
2.5000 mg | ORAL_TABLET | Freq: Every day | ORAL | Status: DC
  Administered 2014-08-26 – 2014-08-30 (×5): 2.5 mg via ORAL
  Filled 2014-08-25 (×5): qty 1

## 2014-08-25 MED ORDER — PRAVASTATIN SODIUM 10 MG PO TABS
10.0000 mg | ORAL_TABLET | Freq: Every day | ORAL | Status: DC
  Administered 2014-08-26 – 2014-08-29 (×4): 10 mg via ORAL
  Filled 2014-08-25 (×4): qty 1

## 2014-08-25 MED ORDER — TRAZODONE HCL 100 MG PO TABS
200.0000 mg | ORAL_TABLET | Freq: Every day | ORAL | Status: DC
  Administered 2014-08-26 – 2014-08-29 (×3): 200 mg via ORAL
  Filled 2014-08-25 (×4): qty 2

## 2014-08-25 MED ORDER — FUROSEMIDE 40 MG PO TABS
40.0000 mg | ORAL_TABLET | Freq: Two times a day (BID) | ORAL | Status: DC
  Administered 2014-08-26 – 2014-08-30 (×9): 40 mg via ORAL
  Filled 2014-08-25 (×9): qty 1

## 2014-08-25 MED ORDER — ACETAMINOPHEN 325 MG PO TABS
650.0000 mg | ORAL_TABLET | ORAL | Status: DC | PRN
  Filled 2014-08-25: qty 2

## 2014-08-25 MED ORDER — PANTOPRAZOLE SODIUM 40 MG PO TBEC
40.0000 mg | DELAYED_RELEASE_TABLET | Freq: Once | ORAL | Status: DC
  Filled 2014-08-25: qty 1

## 2014-08-25 MED ORDER — FUROSEMIDE 40 MG PO TABS: 40.0000 mg | ORAL_TABLET | Freq: Two times a day (BID) | ORAL | Status: DC

## 2014-08-25 MED ORDER — GABAPENTIN 300 MG PO CAPS
300.0000 mg | ORAL_CAPSULE | Freq: Three times a day (TID) | ORAL | Status: DC
  Administered 2014-08-25 – 2014-08-30 (×13): 300 mg via ORAL
  Filled 2014-08-25 (×13): qty 1

## 2014-08-25 MED ORDER — POLYETHYLENE GLYCOL 3350 17 G PO PACK: 17.0000 g | PACK | Freq: Every day | ORAL | Status: DC | PRN

## 2014-08-25 MED ORDER — DIGOXIN 250 MCG PO TABS
0.2500 mg | ORAL_TABLET | Freq: Every day | ORAL | Status: DC
  Administered 2014-08-26 – 2014-08-30 (×5): 0.25 mg via ORAL
  Filled 2014-08-25 (×5): qty 1

## 2014-08-25 MED ORDER — ACETAMINOPHEN 325 MG PO TABS
650.0000 mg | ORAL_TABLET | Freq: Once | ORAL | Status: AC
  Administered 2014-08-25: 650 mg via ORAL
  Filled 2014-08-25: qty 2

## 2014-08-25 MED ORDER — FUROSEMIDE 10 MG/ML IJ SOLN: 20.0000 mg | Freq: Once | INTRAMUSCULAR | Status: DC

## 2014-08-25 MED ORDER — MECLIZINE HCL 25 MG PO TABS: 25.0000 mg | ORAL_TABLET | Freq: Three times a day (TID) | ORAL | Status: DC | PRN

## 2014-08-25 MED ORDER — SERTRALINE HCL 50 MG PO TABS
100.0000 mg | ORAL_TABLET | Freq: Every day | ORAL | Status: DC
  Administered 2014-08-26 – 2014-08-30 (×5): 100 mg via ORAL
  Filled 2014-08-25 (×5): qty 2

## 2014-08-25 MED ORDER — DIPHENHYDRAMINE HCL 25 MG PO CAPS
25.0000 mg | ORAL_CAPSULE | Freq: Once | ORAL | Status: AC
  Administered 2014-08-25: 25 mg via ORAL
  Filled 2014-08-25: qty 1

## 2014-08-25 NOTE — ED Notes (Signed)
Have attempted to draw labs from patient for 6 draws and have been unsuccessful. Lab has been called to get blood.

## 2014-08-25 NOTE — ED Provider Notes (Signed)
[facility] Emergency Department Provider Note  ____________________________________________  Time seen: Approximately 5:29 PM  I have reviewed the triage vital signs and the nursing notes.   HISTORY  Chief Complaint Shortness of Breath    HPI Nicolas Morris is a 81 y.o. male history of coronary disease, pacemaker, congestive heart failure,who presents today with feeling of shortness of breath for about 2 weeks time. He notes that while at his care center he feels short of breath especially after walking from his room to the dining room on a regular basis. He is noticed that he is slight feeling of burning across his chest after walking long distances and mildly winded. He does report increased swelling in his legs over the last couple of weeks time and a slight dry cough. He denies any fevers or chills. He is not currently have any chest pain. Discomfort is described as a brief burning sensation in the middle of his chest that will go away after resting.   Patient is noted to be on digoxin as well as Xarelto, in addition to several other cardiac medications. He doesn't history of atrial fibrillation as well.  Past Medical History  Diagnosis Date  . Non-obstructive CAD     a. s/p multiple caths @ [facility] - last reportedly 06/2012;  b. 06/2013 Lexi MV: EF 51%, no ischemia/infarct.  Marland Kitchen NICM (nonischemic cardiomyopathy)     a. EF reportedly < 30% 01/2013 ([facility]);  b. 01/2012 s/p MDT DTBB1D1 Viva S CRT-D;  c. 12/2012 Echo: EF 45-50%, inf/post HK, nl RV, mild MR.  . Diabetes mellitus without complication   . A-fib     a. Permanent, on Xarelto.  . H/O: GI bleed     a. 12/2012 EGD: HH, evidence of reflux. Erosive Gastritis. Nl duodenum;  b. 12/2012 Colonoscopy: Sev sigmoid and desc colon diverticulosis, transverse and ascending colon diverticulosis, nonbleeding internal hemorrhoids, no bleeding.  . Iron deficiency anemia   . Biventricular ICD (implantable  cardioverter-defibrillator) -Medtronic 01/12/2013    a. 01/2012 [facility]:  01/2012 s/p MDT DTBB1D1 Viva S CRT-D.  Atrial port capped (afib).  . Diverticulosis     a. 12/2012 noted on colonoscopy.  . Hiatal hernia     a. 12/2012 noted on EGD.  Marland Kitchen Gastritis     a. 12/2012 noted on EGD.  Marland Kitchen Hypertension     Patient Active Problem List   Diagnosis Date Noted  . Unsteady gait 03/04/2014  . NICM (nonischemic cardiomyopathy) 07/20/2013  . Diabetes mellitus without complication   . Diverticulosis   . Hiatal hernia   . Non-obstructive CAD   . Neck pain 04/21/2013  . Depression 01/26/2013  . Atrial fibrillation-permanent 01/12/2013  . Biventricular ICD (implantable cardioverter-defibrillator) -Medtronic 01/12/2013  . Congestive dilated cardiomyopathy 01/12/2013  . GI bleed 01/12/2013  . Chronic combined systolic and diastolic CHF (congestive heart failure) 01/12/2013  . Biventricular ICD (implantable cardioverter-defibrillator) -Medtronic 01/12/2013    Past Surgical History  Procedure Laterality Date  . Cardiac catheterization  2013    [facility]   . Cardiac catheterization  2011  . Defib implant  01/27/2012    Serial # [id number] H Model # [id number]  . Pacemaker insertion  01/27/2012  . Colonoscopy  32014  . Upper gastrointestinal endoscopy  2014  . Fetal blood transfusion  2014  . Cardiac defibrillator placement      Current Outpatient Rx  Name  Route  Sig  Dispense  Refill  . acetaminophen (TYLENOL) 325 MG tablet  Oral   Take 650 mg by mouth every 4 (four) hours as needed.         Marland Kitchen aspirin 81 MG tablet   Oral   Take 81 mg by mouth daily.         . carvedilol (COREG) 6.25 MG tablet   Oral   Take 2 tablets (12.5 mg total) by mouth 2 (two) times daily with a meal.         . digoxin (LANOXIN) 0.25 MG tablet   Oral   Take 0.25 mg by mouth daily.         . furosemide (LASIX) 40 MG tablet   Oral   Take 1 tablet (40 mg total) by mouth 2 (two) times daily.   30 tablet    3   . gabapentin (NEURONTIN) 300 MG capsule   Oral   Take 300 mg by mouth 3 (three) times daily.          Marland Kitchen lisinopril (PRINIVIL,ZESTRIL) 2.5 MG tablet   Oral   Take 2.5 mg by mouth daily.         Marland Kitchen lovastatin (MEVACOR) 20 MG tablet   Oral   Take 20 mg by mouth at bedtime.          . meclizine (ANTIVERT) 25 MG tablet   Oral   Take 25 mg by mouth 3 (three) times daily as needed for dizziness.         . metFORMIN (GLUCOPHAGE) 500 MG tablet   Oral   Take by mouth 2 (two) times daily with a meal.         . pantoprazole (PROTONIX) 40 MG tablet   Oral   Take 40 mg by mouth daily.          . potassium chloride (MICRO-K) 10 MEQ CR capsule   Oral   Take 20 mEq by mouth daily.          . Rivaroxaban (XARELTO) 20 MG TABS tablet   Oral   Take 20 mg by mouth daily with supper.         . sertraline (ZOLOFT) 100 MG tablet   Oral   Take 100 mg by mouth daily.         . traZODone (DESYREL) 50 MG tablet   Oral   Take 200 mg by mouth at bedtime.            Allergies No known allergies  Family History  Problem Relation Age of Onset  . Family history unknown: Yes    Social History History  Substance Use Topics  . Smoking status: Former Smoker -- 3.00 packs/day for 45 years    Types: Cigarettes    Quit date: 01/13/2003  . Smokeless tobacco: Not on file  . Alcohol Use: No    Review of Systems Constitutional: No fever/chills Eyes: No visual changes. ENT: No sore throat. Cardiovascular: Denies chest pain though he does occasionally a burning sensation is not reading in the middle of chest after walking distances of about 100 feet. Respiratory: Does not feel short of breath while sitting still, currently no shortness of breath but does feel short of breath after walking distance. Gastrointestinal: No abdominal pain.  No nausea, no vomiting.  No diarrhea.  No constipation. Genitourinary: Negative for dysuria. Musculoskeletal: Negative for back  pain. Skin: Negative for rash. Neurological: Negative for headaches, focal weakness or numbness.  10-point ROS otherwise negative.  ____________________________________________   PHYSICAL EXAM:  VITAL SIGNS: ED Triage  Vitals  Enc Vitals Group     BP 08/25/14 1630 146/54 mmHg     Pulse Rate 08/25/14 1630 75     Resp 08/25/14 1630 23     Temp 08/25/14 1630 98.2 F (36.8 C)     Temp Source 08/25/14 1630 Oral     SpO2 08/25/14 1630 99 %     Weight 08/25/14 1630 232 lb (105.235 kg)     Height 08/25/14 1630  (1.88 m)     Head Cir --      Peak Flow --      Pain Score 08/25/14 1638 7     Pain Loc --      Pain Edu? --      Excl. in GC? --     Constitutional: Alert and oriented. Well appearing and in no acute distress. Eyes: Conjunctivae are normal. PERRL. EOMI. Head: Atraumatic. Nose: No congestion/rhinnorhea. Mouth/Throat: Mucous membranes are moist.  Oropharynx non-erythematous. Neck: No stridor.   Cardiovascular: Normal rate, regular rhythm. Grossly normal heart sounds.  Good peripheral circulation. There is a pacemaker located in the left upper chest with old scar present. Patient has moderate JVD. Respiratory: Normal respiratory effort.  No retractions. Lungs CTAB except for some very faint crackles in the bases bilaterally. He speaks in full sentences and appears in no distress. Gastrointestinal: Soft and nontender. Overweight. No distention. No abdominal bruits. No CVA tenderness. Musculoskeletal: No lower extremity tenderness. Approximately 2+ bilateral lower extremity pitting edema. Neurologic:  Normal speech and language. No gross focal neurologic deficits are appreciated. Speech is normal. No gait instability. Skin:  Skin is warm, dry and intact. No rash noted. Psychiatric: Mood and affect are normal. Speech and behavior are normal.  ____________________________________________   LABS (all labs ordered are listed, but only abnormal results are displayed)  Labs  Reviewed  BRAIN NATRIURETIC PEPTIDE - Abnormal; Notable for the following:    B Natriuretic Peptide 714.0 (*)    All other components within normal limits  CBC - Abnormal; Notable for the following:    RBC 3.57 (*)    Hemoglobin 5.7 (*)    HCT 20.1 (*)    MCV 56.4 (*)    MCH 16.1 (*)    MCHC 28.5 (*)    RDW 20.5 (*)    All other components within normal limits  BASIC METABOLIC PANEL - Abnormal; Notable for the following:    Sodium 131 (*)    Chloride 97 (*)    Glucose, Bld 136 (*)    All other components within normal limits  TROPONIN I - Abnormal; Notable for the following:    Troponin I 0.05 (*)    All other components within normal limits  DIGOXIN LEVEL  APTT  PROTIME-INR  TYPE AND SCREEN   ____________________________________________  EKG  Interpreted by me Electronic ventricularly paced rhythm Ventricular rate 80 QTC 515 Possible underlying atrial fibrillation is considered. Paced rhythm, no obvious acute ischemic change. ____________________________________________  RADIOLOGY  DG Chest 2 View (Final result) Result time: 08/25/14 18:14:35   Final result by Rad Results In Interface (08/25/14 18:14:35)   Narrative:   CLINICAL DATA: Dyspnea on exertion for the past month, worsening over the past 2 days. Ex-smoker.  EXAM: CHEST 2 VIEW  COMPARISON: 06/03/2014.  FINDINGS: Stable enlarged cardiac silhouette and left subclavian pacer and AICD leads. The lungs remain mildly hyperexpanded with mildly prominent interstitial markings. Interval small right pleural effusion and minimal adjacent parenchymal density. Diffuse osteopenia.  IMPRESSION: 1. Interval  small right pleural effusion and minimal adjacent atelectasis. 2. Stable cardiomegaly and mild changes of COPD.    ____________________________________________   PROCEDURES  Procedure(s) performed: None  Critical Care performed: No  ____________________________________________   INITIAL  IMPRESSION / ASSESSMENT AND PLAN / ED COURSE  Pertinent labs & imaging results that were available during my care of the patient were reviewed by me and considered in my medical decision making (see chart for details).  Patient presents with dyspnea with associated burning in his chest after exertion. Certainly symptoms along with increased swelling in his legs are concerning for coronary process, likely congestive heart failure based on my physical exam and history. In addition, angina is considered. EKG is ventricular paced but no obvious acute ischemia is noted, however we will have to rely and troponin. We will obtain chest x-ray, BNP, cardiac labs. Patient has pain and symptom free while at rest in the ER which is reassuring.  ----------------------------------------- 5:53 PM on 08/25/2014 -----------------------------------------  Labs have been slightly delayed due to the patient having poor peripheral access.   ----------------------------------------- 6:50 PM on 08/25/2014 -----------------------------------------  Patient has IV access now, labs returned and hemoglobin is notably quite low at less than 6. I perform stool guaiac on the patient and his faintly positive, he does report he is having black stools last week at the nursing home. He is hemodynamic stable, and they do not believe meets criteria for an emergent blood transfusion but certainly makes criteria for type specific transfusion. Discussed with the patient, he is agreeable with admission and understands plan for admission.  Admission discussed with Dr. Clent Ridges of the hospitalist service.  ____________________________________________   FINAL CLINICAL IMPRESSION(S) / ED DIAGNOSES  Final diagnoses:  Symptomatic anemia  Gastrointestinal hemorrhage, unspecified gastritis, unspecified gastrointestinal hemorrhage type      Sharyn Creamer, MD 08/25/14 [phone]

## 2014-08-25 NOTE — ED Notes (Signed)
Lab successful in getting blood. Patient tolerated well. Patient now to x-ray.

## 2014-08-25 NOTE — Progress Notes (Signed)
Nurse called with DNR form, order changed from full code to DNR  Kristeen Miss Mae [facility] 08/25/2014, 11:33 PM

## 2014-08-25 NOTE — H&P (Signed)
[facility] [place] at [facility]   PATIENT NAME: Nicolas Morris    MR#:  [id number]  DATE OF BIRTH:  07/22/1933  DATE OF ADMISSION:  08/25/2014  PRIMARY CARE PHYSICIAN: Leim Fabry, MD   REQUESTING/REFERRING PHYSICIAN:   CHIEF COMPLAINT:   Chief Complaint  Patient presents with  . Shortness of Breath    HISTORY OF PRESENT ILLNESS: Nicolas Morris  is a 81 y.o. male with a known history of atrial fibrillation who is on Xarelto for that presents to the hospital with complaints of significant shortness of breath. According to the patient he was doing well up until approximately 2 weeks ago when he started having shortness of breath worsening over a period of time and becoming exceptionally bad over the past 2 days. Patient is not able to walk even short distances now without getting short of breath and having some chest burning sensation. He presented to the emergency room for further evaluation where his labs revealed significant anemia with hemoglobin level of 5.7.  Patient tells me that he had a black looking stool last week. He admits of having upper abdominal pains for a few weeks and the feeling dizzy intermittently. Hospitalist services were contacted for admission.  PAST MEDICAL HISTORY:   Past Medical History  Diagnosis Date  . Non-obstructive CAD     a. s/p multiple caths @ [facility] - last reportedly 06/2012;  b. 06/2013 Lexi MV: EF 51%, no ischemia/infarct.  Marland Kitchen NICM (nonischemic cardiomyopathy)     a. EF reportedly < 30% 01/2013 ([facility]);  b. 01/2012 s/p MDT DTBB1D1 Viva S CRT-D;  c. 12/2012 Echo: EF 45-50%, inf/post HK, nl RV, mild MR.  . Diabetes mellitus without complication   . A-fib     a. Permanent, on Xarelto.  . H/O: GI bleed     a. 12/2012 EGD: HH, evidence of reflux. Erosive Gastritis. Nl duodenum;  b. 12/2012 Colonoscopy: Sev sigmoid and desc colon diverticulosis, transverse and ascending colon diverticulosis, nonbleeding internal  hemorrhoids, no bleeding.  . Iron deficiency anemia   . Biventricular ICD (implantable cardioverter-defibrillator) -Medtronic 01/12/2013    a. 01/2012 [facility]:  01/2012 s/p MDT DTBB1D1 Viva S CRT-D.  Atrial port capped (afib).  . Diverticulosis     a. 12/2012 noted on colonoscopy.  . Hiatal hernia     a. 12/2012 noted on EGD.  Marland Kitchen Gastritis     a. 12/2012 noted on EGD.  Marland Kitchen Hypertension     PAST SURGICAL HISTORY:  Past Surgical History  Procedure Laterality Date  . Cardiac catheterization  2013    [facility]   . Cardiac catheterization  2011  . Defib implant  01/27/2012    Serial # [id number] H Model # [id number]  . Pacemaker insertion  01/27/2012  . Colonoscopy  32014  . Upper gastrointestinal endoscopy  2014  . Fetal blood transfusion  2014  . Cardiac defibrillator placement      SOCIAL HISTORY:  History  Substance Use Topics  . Smoking status: Former Smoker -- 3.00 packs/day for 45 years    Types: Cigarettes    Quit date: 01/13/2003  . Smokeless tobacco: Not on file  . Alcohol Use: No    FAMILY HISTORY:  Family History  Problem Relation Age of Onset  . Family history unknown: Yes    DRUG ALLERGIES:  Allergies  Allergen Reactions  . No Known Allergies     Review of Systems  Constitutional: Positive for malaise/fatigue. Negative for fever, chills and weight loss.  HENT: Negative for congestion.   Eyes: Positive for blurred vision. Negative for double vision.  Respiratory: Positive for cough and shortness of breath. Negative for sputum production and wheezing.   Cardiovascular: Positive for chest pain and palpitations. Negative for orthopnea, leg swelling and PND.  Gastrointestinal: Positive for abdominal pain. Negative for nausea, vomiting, diarrhea, constipation, blood in stool and melena.  Genitourinary: Negative for dysuria, urgency, frequency and hematuria.  Musculoskeletal: Negative for falls.  Skin: Negative for rash.  Neurological: Positive for dizziness,  weakness and headaches.  Psychiatric/Behavioral: Negative for depression and memory loss. The patient is not nervous/anxious.     MEDICATIONS AT HOME:  Prior to Admission medications   Medication Sig Start Date End Date Taking? Authorizing Provider  acetaminophen (TYLENOL) 500 MG tablet Take 500 mg by mouth 3 (three) times daily.   Yes Historical Provider, MD  aspirin 81 MG chewable tablet Chew 81 mg by mouth daily.   Yes Historical Provider, MD  carvedilol (COREG) 6.25 MG tablet Take 2 tablets (12.5 mg total) by mouth 2 (two) times daily with a meal. 01/26/13  Yes Duke Salvia, MD  digoxin (LANOXIN) 0.25 MG tablet Take 0.25 mg by mouth daily. 01/26/13  Yes Duke Salvia, MD  finasteride (PROSCAR) 5 MG tablet Take 5 mg by mouth daily.   Yes Historical Provider, MD  furosemide (LASIX) 40 MG tablet Take 1 tablet (40 mg total) by mouth 2 (two) times daily. Patient taking differently: Take 40 mg by mouth daily.  01/20/13  Yes Antonieta Iba, MD  gabapentin (NEURONTIN) 300 MG capsule Take 300 mg by mouth 3 (three) times daily.    Yes Historical Provider, MD  lisinopril (PRINIVIL,ZESTRIL) 2.5 MG tablet Take 2.5 mg by mouth daily.   Yes Historical Provider, MD  lovastatin (MEVACOR) 20 MG tablet Take 20 mg by mouth at bedtime.  10/04/13 10/04/14 Yes Historical Provider, MD  meclizine (ANTIVERT) 25 MG tablet Take 25 mg by mouth 3 (three) times daily as needed for dizziness.   Yes Historical Provider, MD  metFORMIN (GLUCOPHAGE) 500 MG tablet Take 1,000 mg by mouth 2 (two) times daily with a meal.    Yes Historical Provider, MD  pantoprazole (PROTONIX) 40 MG tablet Take 40 mg by mouth daily.    Yes Historical Provider, MD  polyethylene glycol (MIRALAX / GLYCOLAX) packet Take 17 g by mouth daily as needed for mild constipation or moderate constipation.   Yes Historical Provider, MD  potassium chloride SA (K-DUR,KLOR-CON) 20 MEQ tablet Take 20 mEq by mouth 2 (two) times daily.   Yes Historical Provider, MD   Rivaroxaban (XARELTO) 20 MG TABS tablet Take 20 mg by mouth daily with supper.   Yes Historical Provider, MD  sertraline (ZOLOFT) 100 MG tablet Take 100 mg by mouth daily.   Yes Historical Provider, MD  acetaminophen (TYLENOL) 325 MG tablet Take 650 mg by mouth every 4 (four) hours as needed.    Historical Provider, MD  aspirin 81 MG tablet Take 81 mg by mouth daily.    Historical Provider, MD  potassium chloride (MICRO-K) 10 MEQ CR capsule Take 20 mEq by mouth daily.     Historical Provider, MD  traZODone (DESYREL) 50 MG tablet Take 200 mg by mouth at bedtime.     Historical Provider, MD      PHYSICAL EXAMINATION:   VITAL SIGNS: Blood pressure 150/64, pulse 75, temperature 98.2 F (36.8 C), temperature source Oral, resp. rate 20, height 6\' 2"  (1.88 m), weight 105.235  kg (232 lb), SpO2 98 %.  GENERAL:  81 y.o.-year-old patient lying in the bed with no acute distress. Very pale and dry oral mucosa.  EYES: Pupils equal, round, reactive to light and accommodation. No scleral icterus. Extraocular muscles intact.  HEENT: Head atraumatic, normocephalic. Oropharynx and nasopharynx clear.  NECK:  Supple, no jugular venous distention. No thyroid enlargement, no tenderness.  LUNGS: Somewhat diminished breath sounds bilaterally, no wheezing, rales,rhonchi or crepitation. No use of accessory muscles of respiration. Good respiratory effort CARDIOVASCULAR: S1, S2 normal. No murmurs, rubs, or gallops.  ABDOMEN: Soft, tender diffusely but no rebound or guarding was noted, nondistended. Bowel sounds present. No organomegaly or masses.  EXTREMITIES: 1-2+ lower extremity and pedal edema, but no cyanosis, or clubbing.  NEUROLOGIC: Cranial nerves II through XII are intact. Muscle strength 5/5 in all extremities. Sensation intact. Gait not checked.  PSYCHIATRIC: The patient is alert and oriented x 3.  SKIN: No obvious rash, lesion, or ulcer.   LABORATORY PANEL:   CBC  Recent Labs Lab 08/25/14 1747  WBC  10.5  HGB 5.7*  HCT 20.1*  PLT 288  MCV 56.4*  MCH 16.1*  MCHC 28.5*  RDW 20.5*   ------------------------------------------------------------------------------------------------------------------  Chemistries   Recent Labs Lab 08/25/14 1747  NA 131*  K 4.3  CL 97*  CO2 25  GLUCOSE 136*  BUN 16  CREATININE 0.97  CALCIUM 9.1   ------------------------------------------------------------------------------------------------------------------  Cardiac Enzymes  Recent Labs Lab 08/25/14 1747  TROPONINI 0.05*   ------------------------------------------------------------------------------------------------------------------  RADIOLOGY: Dg Chest 2 View  08/25/2014   CLINICAL DATA:  Dyspnea on exertion for the past month, worsening over the past 2 days. Ex-smoker.  EXAM: CHEST  2 VIEW  COMPARISON:  06/03/2014.  FINDINGS: Stable enlarged cardiac silhouette and left subclavian pacer and AICD leads. The lungs remain mildly hyperexpanded with mildly prominent interstitial markings. Interval small right pleural effusion and minimal adjacent parenchymal density. Diffuse osteopenia.  IMPRESSION: 1. Interval small right pleural effusion and minimal adjacent atelectasis. 2. Stable cardiomegaly and mild changes of COPD.   Electronically Signed   By: Beckie Salts M.D.   On: 08/25/2014 18:14    EKG: Orders placed or performed during the hospital encounter of 06/26/14  . ED EKG  . ED EKG  . EKG    IMPRESSION AND PLAN:  Active Problems:   Acute posthemorrhagic anemia 1. Acute posthemorrhagic anemia. Admit patient to medical floor, transfuse him with 2 units of packed red blood cells, follow hemoglobin level later in the morning 2. Gastrointestinal bleed per history. Initiate patient on Protonix IV twice daily. Get gastroenterology consultation for further recommendations. Hold aspirin and Xarelto 3. Hyponatremia, likely due to some element of dehydration, hold diuretics. Follow sodium  level after transfusion 4. Chest pain pain with elevated troponin, likely due to demand ischemia, continue patient on Coreg. Hold aspirin due to GI bleed, get cardiologist, Dr. Gwen Pounds to see the patient in consultation 5   All the records are reviewed and case discussed with ED provider. Management plans discussed with the patient, family and they are in agreement.  CODE STATUS:  Full code  TOTAL TIME TAKING CARE OF THIS PATIENT: 55 minutes.    Katharina Caper M.D on 08/25/2014 at 8:02 PM  Between 7am to 6pm - Pager - [phone] After 6pm go to [email or web address] - password [email or web address] [facility]  Office  [phone]  CC: Primary care physician; Leim Fabry, MD

## 2014-08-25 NOTE — ED Notes (Signed)
Patient has experienced increased SOB for last few weeks. States he gets very winded with any exertion.

## 2014-08-26 LAB — BASIC METABOLIC PANEL
Anion gap: 9 (ref 5–15)
BUN: 15 mg/dL (ref 6–20)
CO2: 24 mmol/L (ref 22–32)
Calcium: 9.2 mg/dL (ref 8.9–10.3)
Chloride: 102 mmol/L (ref 101–111)
Creatinine, Ser: 0.81 mg/dL (ref 0.61–1.24)
GFR calc Af Amer: >60 mL/min (ref >60)
Glucose, Bld: 123 mg/dL — ABNORMAL HIGH (ref 65–99)
POTASSIUM: 4.5 mmol/L (ref 3.5–5.1)
SODIUM: 135 mmol/L (ref 135–145)

## 2014-08-26 LAB — CBC
HCT: 22.3 % — ABNORMAL LOW (ref 40.0–52.0)
HEMOGLOBIN: 6.4 g/dL — AB (ref 13.0–18.0)
MCH: 16.9 pg — AB (ref 26.0–34.0)
MCHC: 28.9 g/dL — ABNORMAL LOW (ref 32.0–36.0)
MCV: 58.6 fL — ABNORMAL LOW (ref 80.0–100.0)
PLATELETS: 267 10*3/uL (ref 150–440)
RBC: 3.81 MIL/uL — AB (ref 4.40–5.90)
RDW: 22.3 % — ABNORMAL HIGH (ref 11.5–14.5)
WBC: 10.7 10*3/uL — AB (ref 3.8–10.6)

## 2014-08-26 LAB — IRON AND TIBC
Iron: 37 ug/dL — ABNORMAL LOW (ref 45–182)
Saturation Ratios: 8 % — ABNORMAL LOW (ref 17.9–39.5)
TIBC: 485 ug/dL — AB (ref 250–450)
UIBC: 448 ug/dL

## 2014-08-26 LAB — TROPONIN I
TROPONIN I: 0.05 ng/mL — AB (ref <0.031)
Troponin I: 0.05 ng/mL — ABNORMAL HIGH (ref <0.031)

## 2014-08-26 LAB — FERRITIN: Ferritin: 9 ng/mL — ABNORMAL LOW (ref 24–336)

## 2014-08-26 LAB — HEMOGLOBIN AND HEMATOCRIT, BLOOD
HEMATOCRIT: 25.1 % — AB (ref 40.0–52.0)
HEMOGLOBIN: 7.5 g/dL — AB (ref 13.0–18.0)

## 2014-08-26 LAB — MRSA PCR SCREENING: MRSA BY PCR: NEGATIVE

## 2014-08-26 LAB — DIGOXIN LEVEL: DIGOXIN LVL: 0.6 ng/mL — AB (ref 0.8–2.0)

## 2014-08-26 NOTE — Consult Note (Signed)
GI Inpatient Consult Note  Reason for Consult:  Severe anemia, ? Dark stools Attending Requesting Consult: Dr Winona Legato  History of Present Illness: Nicolas Morris is a 81 y.o. male with a history of Afib on Xarelto, diabetes, and previous GIB in 2014 who presented to the ED with significant SOB and weakness.  Patient states he has been experiencing SOB worsening over the last two weeks, becoming severe two days ago.  Hgb was found to be 5.7 and troponin was elevated, so patient was admitted for further management.  Patient reports he was doing well until SOB became severe and he began feeling dizzy and weak when walking short distances.  He recalls one episode of black stool last week, denies seeing blood in the stool otherwise.  He usually alternates between constipation and diarrhea every few days.  He has also had progressively worsening periumbilical abdominal pain for the last 2-3 months, worse after eating.   He also notes increased belching and episodes of reflux.  He denies fever, chills, nausea, vomiting, and dysphagia.  Patient is a poor historian when discussing his past medical history.  Previously he had a GIB in 2014, though patient cannot remember what symptoms he was having at the time.  Colonoscopy showed diverticulosis but no bleeding.  Patient also reports another hospitalization last year requiring 4 transfusions, but no records of this were found.  He cannot recall additional details of the admission but states no one has identified the source of his anemia.   Past Medical History:  Past Medical History  Diagnosis Date  . Non-obstructive CAD     a. s/p multiple caths @ [facility] - last reportedly 06/2012;  b. 06/2013 Lexi MV: EF 51%, no ischemia/infarct.  Marland Kitchen NICM (nonischemic cardiomyopathy)     a. EF reportedly < 30% 01/2013 ([facility]);  b. 01/2012 s/p MDT DTBB1D1 Viva S CRT-D;  c. 12/2012 Echo: EF 45-50%, inf/post HK, nl RV, mild MR.  . Diabetes mellitus without complication   .  A-fib     a. Permanent, on Xarelto.  . H/O: GI bleed     a. 12/2012 EGD: HH, evidence of reflux. Erosive Gastritis. Nl duodenum;  b. 12/2012 Colonoscopy: Sev sigmoid and desc colon diverticulosis, transverse and ascending colon diverticulosis, nonbleeding internal hemorrhoids, no bleeding.  . Iron deficiency anemia   . Biventricular ICD (implantable cardioverter-defibrillator) -Medtronic 01/12/2013    a. 01/2012 [facility]:  01/2012 s/p MDT DTBB1D1 Viva S CRT-D.  Atrial port capped (afib).  . Diverticulosis     a. 12/2012 noted on colonoscopy.  . Hiatal hernia     a. 12/2012 noted on EGD.  Marland Kitchen Gastritis     a. 12/2012 noted on EGD.  Marland Kitchen Hypertension     Problem List: Patient Active Problem List   Diagnosis Date Noted  . Acute posthemorrhagic anemia 08/25/2014  . Unsteady gait 03/04/2014  . NICM (nonischemic cardiomyopathy) 07/20/2013  . Diabetes mellitus without complication   . Diverticulosis   . Hiatal hernia   . Non-obstructive CAD   . Neck pain 04/21/2013  . Depression 01/26/2013  . Atrial fibrillation-permanent 01/12/2013  . Biventricular ICD (implantable cardioverter-defibrillator) -Medtronic 01/12/2013  . Congestive dilated cardiomyopathy 01/12/2013  . GI bleed 01/12/2013  . Chronic combined systolic and diastolic CHF (congestive heart failure) 01/12/2013  . Biventricular ICD (implantable cardioverter-defibrillator) -Medtronic 01/12/2013    Past Surgical History: Past Surgical History  Procedure Laterality Date  . Cardiac catheterization  2013    [facility]   . Cardiac catheterization  2011  . Defib implant  01/27/2012    Serial # [id number] H Model # [id number]  . Pacemaker insertion  01/27/2012  . Colonoscopy  32014  . Upper gastrointestinal endoscopy  2014  . Fetal blood transfusion  2014  . Cardiac defibrillator placement      Allergies: Allergies  Allergen Reactions  . No Known Allergies     Home Medications: Prescriptions prior to admission  Medication Sig Dispense  Refill Last Dose  . acetaminophen (TYLENOL) 500 MG tablet Take 500 mg by mouth 3 (three) times daily.   08/25/2014 at Unknown time  . aspirin 81 MG chewable tablet Chew 81 mg by mouth daily.   08/25/2014 at Unknown time  . carvedilol (COREG) 6.25 MG tablet Take 2 tablets (12.5 mg total) by mouth 2 (two) times daily with a meal.   08/25/2014 at Unknown time  . digoxin (LANOXIN) 0.25 MG tablet Take 0.25 mg by mouth daily.   08/25/2014 at Unknown time  . finasteride (PROSCAR) 5 MG tablet Take 5 mg by mouth daily.   08/25/2014 at Unknown time  . furosemide (LASIX) 40 MG tablet Take 1 tablet (40 mg total) by mouth 2 (two) times daily. (Patient taking differently: Take 40 mg by mouth daily. ) 30 tablet 3 08/25/2014 at Unknown time  . gabapentin (NEURONTIN) 300 MG capsule Take 300 mg by mouth 3 (three) times daily.    08/25/2014 at Unknown time  . lisinopril (PRINIVIL,ZESTRIL) 2.5 MG tablet Take 2.5 mg by mouth daily.   08/25/2014 at Unknown time  . lovastatin (MEVACOR) 20 MG tablet Take 20 mg by mouth at bedtime.    08/24/2014 at Unknown time  . meclizine (ANTIVERT) 25 MG tablet Take 25 mg by mouth 3 (three) times daily as needed for dizziness.   08/25/2014 at Unknown time  . metFORMIN (GLUCOPHAGE) 500 MG tablet Take 1,000 mg by mouth 2 (two) times daily with a meal.    08/25/2014 at Unknown time  . pantoprazole (PROTONIX) 40 MG tablet Take 40 mg by mouth daily.    08/25/2014 at Unknown time  . polyethylene glycol (MIRALAX / GLYCOLAX) packet Take 17 g by mouth daily as needed for mild constipation or moderate constipation.   unknown  . potassium chloride SA (K-DUR,KLOR-CON) 20 MEQ tablet Take 20 mEq by mouth 2 (two) times daily.   08/25/2014 at Unknown time  . Rivaroxaban (XARELTO) 20 MG TABS tablet Take 20 mg by mouth daily with supper.   08/24/2014 at Unknown time  . sertraline (ZOLOFT) 100 MG tablet Take 100 mg by mouth daily.   08/25/2014 at Unknown time  . acetaminophen (TYLENOL) 325 MG tablet Take 650 mg by mouth every 4 (four)  hours as needed.   Taking  . aspirin 81 MG tablet Take 81 mg by mouth daily.   Taking  . potassium chloride (MICRO-K) 10 MEQ CR capsule Take 20 mEq by mouth daily.    Taking  . traZODone (DESYREL) 50 MG tablet Take 200 mg by mouth at bedtime.    Taking   Home medication reconciliation was completed with the patient.   Scheduled Inpatient Medications:   . acetaminophen  500 mg Oral TID  . carvedilol  12.5 mg Oral BID WC  . digoxin  0.25 mg Oral Daily  . finasteride  5 mg Oral Daily  . furosemide  40 mg Oral BID  . gabapentin  300 mg Oral TID  . lisinopril  2.5 mg Oral Daily  . pantoprazole  40 mg  Oral Once  . pantoprazole (PROTONIX) IV  40 mg Intravenous Q12H  . pravastatin  10 mg Oral q1800  . sertraline  100 mg Oral Daily  . sodium chloride  3 mL Intravenous Q12H  . traZODone  200 mg Oral QHS    Continuous Inpatient Infusions:     PRN Inpatient Medications:  acetaminophen, meclizine, polyethylene glycol  Family History:   The patient's family history is positive for father and brother having liver cancer( he's seems somewhat unsure about this), negative for inflammatory bowel disorders, colon cancer or polyps, and solid organ transplantation.  Social History:   reports that he quit smoking about 11 years ago. His smoking use included Cigarettes. He has a 135 pack-year smoking history. He does not have any smokeless tobacco history on file. He reports that he does not drink alcohol or use illicit drugs.   Review of Systems: Constitutional: Weight is stable, negative for fever, chills, and weight loss. Positive for weakness, fatigue, and dizziness.  Eyes: + Blurred vision. ENT: No oral lesions, sore throat.  GI: see HPI.  Heme/Lymph: Positive for easy bleeding and bruising.  CV: Positive for intermittent chest pain, palpitations and SOB.  GU: No hematuria.  Integumentary: No rashes.  Neuro: Positive for headaches.  Psych: No depression/anxiety.  Endocrine: No heat/cold  intolerance.  Allergic/Immunologic: No urticaria.  Resp: No cough, SOB.  Musculoskeletal: No joint swelling.    Physical Examination: BP 148/63 mmHg  Pulse 73  Temp(Src) 98.2 F (36.8 C) (Oral)  Resp 18  Ht  (1.88 m)  Wt 108.773 kg (239 lb 12.8 oz)  BMI 30.78 kg/m2  SpO2 96% Gen: NAD, alert and oriented x 4, sitting up comfortably in bed,  chronicaly appearing, appears older that stated HEENT: PEERLA, EOMI, Neck: supple, no JVD or thyromegaly Chest: CTA bilaterally, no wheezes, crackles, or other adventitious sounds CV: RRR, no m/g/c/r Abd: soft, epigastric and periumbilical tenderness to palpation, ND, +BS in all four quadrants; no HSM, guarding, ridigity, or rebound tenderness Ext: 1-2+ lower extremity edema, well perfused with 2+ pulses, Skin: no rash or lesions noted Lymph: no LAD  Data: Lab Results  Component Value Date   WBC 10.7* 08/26/2014   HGB 6.4* 08/26/2014   HCT 22.3* 08/26/2014   MCV 58.6* 08/26/2014   PLT 267 08/26/2014    Recent Labs Lab 08/25/14 1747 08/26/14 0238  HGB 5.7* 6.4*   Lab Results  Component Value Date   NA 135 08/26/2014   K 4.5 08/26/2014   CL 102 08/26/2014   CO2 24 08/26/2014   BUN 15 08/26/2014   CREATININE 0.81 08/26/2014   Lab Results  Component Value Date   ALT 14* 06/26/2014   AST 26 06/26/2014   ALKPHOS 73 06/26/2014   BILITOT 0.6 06/26/2014    Recent Labs Lab 08/25/14 1930  APTT 35  INR 1.26   Assessment/Plan: Nicolas Morris is a 81 y.o. male f Afib on Xarelto, diabetes, NICM s/p AICD and previous GIB in 2014 p/w severe anemia and possible darks stools one week ago.  No evidence of active bleeding.  Severeal recent admissions for c/p and CHF.  Admitted 2014 for anemia and had EGD/ colon with no source.    Since he will likely go back on anti-coag, would recommend repeat EGD / Colon + - capsule to eval for and potentially treat source of bleeding.  If EGD / colon neg, would do capsule immediately after.     Recommendations: - moitor Hgb, transfuse for  Hgb < 7 - plan for EGD / colon on Monday once xarelto has washed out.  - clear liquids Sunday with colon prep Sunday night - will add iron studies to pre trans labs.  - Continue IV Protonix 40mg  BID  Thank you for the consult. Please call with questions or concerns.  Zakai Gonyea, Addison Naegeli, MD

## 2014-08-26 NOTE — Consult Note (Signed)
[facility] CLINIC CARDIOLOGY [facility]  CARDIOLOGY CONSULT NOTE  Patient ID: Nicolas Morris MRN: [id number] DOB/AGE: 79-18-1935 79 y.o.  Admit date: 08/25/2014 Referring Physician Dr. Imogene Burn Primary Physician   Primary Cardiologist   Reason for Consultation GI bleed  HPI:  Patient is a 81 year old male with history of atrial fibrillation treated with Xarelto for anticoagulation.  He also has diabetes and previous GI bleed.  He presented to the emergency room with weakness and and noted to have a hemoglobin of 5.7.  He was given blood transfusion with some improvement.  He has a  Ejection fraction of 45-50%.  Has improved somewhat since he was transfused.  He has less fatigue and shortness of breath.  He denies further GI bleed.  It is of note he had a GI bleed previously requiring 4 units of blood.  Has a history of a biventricular pacemaker defibrillator.  There is no evidence of arrhythmias, syncope or presyncope.  His atrial fibrillation rate is well controlled.  It is chronic  ROS Review of Systems - History obtained from chart review and the patient General ROS: positive for  - fatigue and malaise Respiratory ROS: positive for - shortness of breath Cardiovascular ROS: positive for - chest pain and The epigastric pain Gastrointestinal ROS: positive for - abdominal pain, blood in stools and nausea/vomiting Neurological ROS: no TIA or stroke symptoms   Past Medical History  Diagnosis Date  . Non-obstructive CAD     a. s/p multiple caths @ [facility] - last reportedly 06/2012;  b. 06/2013 Lexi MV: EF 51%, no ischemia/infarct.  Marland Kitchen NICM (nonischemic cardiomyopathy)     a. EF reportedly < 30% 01/2013 ([facility]);  b. 01/2012 s/p MDT DTBB1D1 Viva S CRT-D;  c. 12/2012 Echo: EF 45-50%, inf/post HK, nl RV, mild MR.  . Diabetes mellitus without complication   . A-fib     a. Permanent, on Xarelto.  . H/O: GI bleed     a. 12/2012 EGD: HH, evidence of reflux. Erosive Gastritis. Nl duodenum;  b.  12/2012 Colonoscopy: Sev sigmoid and desc colon diverticulosis, transverse and ascending colon diverticulosis, nonbleeding internal hemorrhoids, no bleeding.  . Iron deficiency anemia   . Biventricular ICD (implantable cardioverter-defibrillator) -Medtronic 01/12/2013    a. 01/2012 [facility]:  01/2012 s/p MDT DTBB1D1 Viva S CRT-D.  Atrial port capped (afib).  . Diverticulosis     a. 12/2012 noted on colonoscopy.  . Hiatal hernia     a. 12/2012 noted on EGD.  Marland Kitchen Gastritis     a. 12/2012 noted on EGD.  Marland Kitchen Hypertension     Family History  Problem Relation Age of Onset  . Family history unknown: Yes    History   Social History  . Marital Status: Married    Spouse Name: N/A  . Number of Children: N/A  . Years of Education: N/A   Occupational History  . Not on file.   Social History Main Topics  . Smoking status: Former Smoker -- 3.00 packs/day for 45 years    Types: Cigarettes    Quit date: 01/13/2003  . Smokeless tobacco: Not on file  . Alcohol Use: No  . Drug Use: No  . Sexual Activity: Not on file   Other Topics Concern  . Not on file   Social History Narrative    Past Surgical History  Procedure Laterality Date  . Cardiac catheterization  2013    [facility]   . Cardiac catheterization  2011  . Defib implant  01/27/2012    Serial # [id number] H Model # [id number]  . Pacemaker insertion  01/27/2012  . Colonoscopy  32014  . Upper gastrointestinal endoscopy  2014  . Fetal blood transfusion  2014  . Cardiac defibrillator placement       Prescriptions prior to admission  Medication Sig Dispense Refill Last Dose  . acetaminophen (TYLENOL) 500 MG tablet Take 500 mg by mouth 3 (three) times daily.   08/25/2014 at Unknown time  . aspirin 81 MG chewable tablet Chew 81 mg by mouth daily.   08/25/2014 at Unknown time  . carvedilol (COREG) 6.25 MG tablet Take 2 tablets (12.5 mg total) by mouth 2 (two) times daily with a meal.   08/25/2014 at Unknown time  . digoxin (LANOXIN) 0.25 MG tablet  Take 0.25 mg by mouth daily.   08/25/2014 at Unknown time  . finasteride (PROSCAR) 5 MG tablet Take 5 mg by mouth daily.   08/25/2014 at Unknown time  . furosemide (LASIX) 40 MG tablet Take 1 tablet (40 mg total) by mouth 2 (two) times daily. (Patient taking differently: Take 40 mg by mouth daily. ) 30 tablet 3 08/25/2014 at Unknown time  . gabapentin (NEURONTIN) 300 MG capsule Take 300 mg by mouth 3 (three) times daily.    08/25/2014 at Unknown time  . lisinopril (PRINIVIL,ZESTRIL) 2.5 MG tablet Take 2.5 mg by mouth daily.   08/25/2014 at Unknown time  . lovastatin (MEVACOR) 20 MG tablet Take 20 mg by mouth at bedtime.    08/24/2014 at Unknown time  . meclizine (ANTIVERT) 25 MG tablet Take 25 mg by mouth 3 (three) times daily as needed for dizziness.   08/25/2014 at Unknown time  . metFORMIN (GLUCOPHAGE) 500 MG tablet Take 1,000 mg by mouth 2 (two) times daily with a meal.    08/25/2014 at Unknown time  . pantoprazole (PROTONIX) 40 MG tablet Take 40 mg by mouth daily.    08/25/2014 at Unknown time  . polyethylene glycol (MIRALAX / GLYCOLAX) packet Take 17 g by mouth daily as needed for mild constipation or moderate constipation.   unknown  . potassium chloride SA (K-DUR,KLOR-CON) 20 MEQ tablet Take 20 mEq by mouth 2 (two) times daily.   08/25/2014 at Unknown time  . Rivaroxaban (XARELTO) 20 MG TABS tablet Take 20 mg by mouth daily with supper.   08/24/2014 at Unknown time  . sertraline (ZOLOFT) 100 MG tablet Take 100 mg by mouth daily.   08/25/2014 at Unknown time  . acetaminophen (TYLENOL) 325 MG tablet Take 650 mg by mouth every 4 (four) hours as needed.   Taking  . aspirin 81 MG tablet Take 81 mg by mouth daily.   Taking  . potassium chloride (MICRO-K) 10 MEQ CR capsule Take 20 mEq by mouth daily.    Taking  . traZODone (DESYREL) 50 MG tablet Take 200 mg by mouth at bedtime.    Taking    Physical Exam: Blood pressure 122/55, pulse 74, temperature 98.6 F (37 C), temperature source Oral, resp. rate 18, height   (1.88 m), weight 108.773 kg (239 lb 12.8 oz), SpO2 97 %.    General appearance: cooperative Head: Normocephalic, without obvious abnormality, atraumatic Resp: clear to auscultation bilaterally Cardio: irregularly irregular rhythm GI: abnormal findings:  hyperactive bowel sounds Extremities: extremities normal, atraumatic, no cyanosis or edema Pulses: 2+ and symmetric Neurologic: Grossly normal Labs:   Lab Results  Component Value Date   WBC 10.7* 08/26/2014   HGB 7.5* 08/26/2014  HCT 25.1* 08/26/2014   MCV 58.6* 08/26/2014   PLT 267 08/26/2014    Recent Labs Lab 08/26/14 0238  NA 135  K 4.5  CL 102  CO2 24  BUN 15  CREATININE 0.81  CALCIUM 9.2  GLUCOSE 123*   Lab Results  Component Value Date   TROPONINI 0.05* 08/26/2014      Radiology:  Small right pleural effusion with stable cardiomegaly and COPD EKG:  Atrial fibrillation with controlled ventricular response  ASSESSMENT AND PLAN:  Patient with history of chronic atrial fibrillation anticoagulated with Xarelto.  He has a history of her previous GI bleed and now presents with recurrent GI bleed with a hemoglobin down to 5.7.  He is on Xarelto for anticoagulation.  Given recurrent GI bleed, he will need to come off of Xarelto remain off of all chronic anticoagulation.  Patient's chads 2 Vasc score is 4. Risk of bleeding exceeds the benefit at this point.  Would discontinue Xarelto and anti-platelet therapy.  Continue with rate control transfuse as needed in workup possible source of GI bleed.   Discussion regarding returning to any form of anticoagulation in the future will be discussed as an outpatient.  He has ruled out from myocardia infarction.  Serum troponin of 0.05 is demand ischemia. Signed: Dalia Heading MD, [facility] 08/26/2014, 2:36 PM

## 2014-08-26 NOTE — Clinical Social Work Note (Signed)
Clinical Social Work Assessment  Patient Details  Name: Nicolas Morris MRN: [id number] Date of Birth: [birth year]  Date of referral:  08/26/14               Reason for consult:  Facility Placement (pt is a resident at [facility] (ALF))                Permission sought to share information with:  Facility Medical sales representative, Family Supports Permission granted to share information::  Yes, Verbal Permission Granted (CSW was given permission to talk to facility only.  Pt stated he did have any family memners he wanted CSW to contact.)  Name::        Agency::     Relationship::     Contact Information:     Housing/Transportation Living arrangements for the past 2 months:  Assisted Living Facility Source of Information:  Patient Patient Interpreter Needed:  None Criminal Activity/Legal Involvement Pertinent to Current Situation/Hospitalization:  No - Comment as needed Significant Relationships:  None Lives with:  Facility Resident Do you feel safe going back to the place where you live?  Yes Need for family participation in patient care:  No (Coment)  Care giving concerns:  No concerns voiced by pt at this time   Office manager / plan:  CSW spoke to pt.  He was sitting up in his chair alert and Ox4.  He was in agreement with DC back to [facility].  He stated that he did not have any family that he wanted CSW to contact.  CSW was given permission to contact facility.  Per pt he uses a walker or cane at the facility.  CSW attempted to contact facility but there was no answer.  CSW will attempt again at a later date.     Employment status:  Retired Health and safety inspector:  Medicare PT Recommendations:    Information / Referral to community resources:     Patient/Family's Response to care:  Pt was in agreement with DC to ALF   Patient/Family's Understanding of and Emotional Response to Diagnosis, Current Treatment, and Prognosis:  Pt was in agreement with DC to ALF and  verbalized his understanding of this DC plan Emotional Assessment Appearance:  Appears stated age Attitude/Demeanor/Rapport:   (pt seemed tired) Affect (typically observed):   (unremarkable) Orientation:  Oriented to Self, Oriented to Place, Oriented to  Time, Oriented to Situation Alcohol / Substance use:  Never Used Psych involvement (Current and /or in the community):  No (Comment)  Discharge Needs  Concerns to be addressed:  Care Coordination Readmission within the last 30 days:    Current discharge risk:  None Barriers to Discharge:  No Barriers Identified   Chauncy Passy, LCSW 08/26/2014, 12:18 PM

## 2014-08-26 NOTE — Progress Notes (Signed)
[facility] Physicians - [place] at [facility]   PATIENT NAME: Nicolas Morris    MR#:  [id number]  DATE OF BIRTH:  18-Mar-1933  SUBJECTIVE:  CHIEF COMPLAINT:   Chief Complaint  Patient presents with  . Shortness of Breath  Weakness, SOB is better.  S/p 2 units PRBC transfusion.  REVIEW OF SYSTEMS:  CONSTITUTIONAL: No fever, generalized weakness.  EYES: No blurred or double vision.  EARS, NOSE, AND THROAT: No tinnitus or ear pain.  RESPIRATORY: No cough, shortness of breath, wheezing or hemoptysis.  CARDIOVASCULAR: No chest pain, orthopnea, edema.  GASTROINTESTINAL: No nausea, vomiting, diarrhea or abdominal pain. No melena or bloody stool since admission.  GENITOURINARY: No dysuria, hematuria.  ENDOCRINE: No polyuria, nocturia,  HEMATOLOGY: No anemia, easy bruising or bleeding SKIN: No rash or lesion. MUSCULOSKELETAL: No joint pain or arthritis.   NEUROLOGIC: No tingling, numbness, weakness.  PSYCHIATRY: No anxiety or depression.   DRUG ALLERGIES:   Allergies  Allergen Reactions  . No Known Allergies     VITALS:  Blood pressure 122/55, pulse 74, temperature 98.6 F (37 C), temperature source Oral, resp. rate 18, height  (1.88 m), weight 108.773 kg (239 lb 12.8 oz), SpO2 97 %.  PHYSICAL EXAMINATION:  GENERAL:  81 y.o.-year-old patient lying in the bed with no acute distress.  EYES: Pupils equal, round, reactive to light and accommodation. No scleral icterus. Extraocular muscles intact. Pale conjunctiva. HEENT: Head atraumatic, normocephalic. Oropharynx and nasopharynx clear.  NECK:  Supple, no jugular venous distention. No thyroid enlargement, no tenderness.  LUNGS: Normal breath sounds bilaterally, no wheezing, rales,rhonchi or crepitation. No use of accessory muscles of respiration.  CARDIOVASCULAR: S1, S2 normal. No murmurs, rubs, or gallops.  ABDOMEN: Soft, nontender, nondistended. Bowel sounds present. No organomegaly or mass.  EXTREMITIES: No  pedal edema, cyanosis, or clubbing.  NEUROLOGIC: Cranial nerves II through XII are intact. Muscle strength 5/5 in all extremities. Sensation intact. Gait not checked.  PSYCHIATRIC: The patient is alert and oriented x 3.  SKIN: No obvious rash, lesion, or ulcer.    LABORATORY PANEL:   CBC  Recent Labs Lab 08/26/14 0238 08/26/14 0953  WBC 10.7*  --   HGB 6.4* 7.5*  HCT 22.3* 25.1*  PLT 267  --    ------------------------------------------------------------------------------------------------------------------  Chemistries   Recent Labs Lab 08/26/14 0238  NA 135  K 4.5  CL 102  CO2 24  GLUCOSE 123*  BUN 15  CREATININE 0.81  CALCIUM 9.2   ------------------------------------------------------------------------------------------------------------------  Cardiac Enzymes  Recent Labs Lab 08/26/14 0238  TROPONINI 0.05*   ------------------------------------------------------------------------------------------------------------------  RADIOLOGY:  Dg Chest 2 View  08/25/2014   CLINICAL DATA:  Dyspnea on exertion for the past month, worsening over the past 2 days. Ex-smoker.  EXAM: CHEST  2 VIEW  COMPARISON:  06/03/2014.  FINDINGS: Stable enlarged cardiac silhouette and left subclavian pacer and AICD leads. The lungs remain mildly hyperexpanded with mildly prominent interstitial markings. Interval small right pleural effusion and minimal adjacent parenchymal density. Diffuse osteopenia.  IMPRESSION: 1. Interval small right pleural effusion and minimal adjacent atelectasis. 2. Stable cardiomegaly and mild changes of COPD.   Electronically Signed   By: Beckie Salts M.D.   On: 08/25/2014 18:14    EKG:   Orders placed or performed during the hospital encounter of 08/25/14  . EKG 12-Lead  . EKG 12-Lead    ASSESSMENT AND PLAN:   1. Acute posthemorrhagic anemia.  S/p  2 units of packed red blood cells, Hb 7.5.  2. Gastrointestinal bleed.  Continue  Protonix IV twice  daily.f/u GI consultation for further recommendations. Hold aspirin and Xarelto  3. Hyponatremia, likely due to some element of dehydration, hold diuretics. Improved.  4. Chest pain pain with elevated troponin, likely due to demand ischemia, continue Coreg. Hold aspirin due to GI bleed, f/u cardiologist, Dr. Gwen Pounds.   All the records are reviewed and case discussed with Care Management/Social Workerr. Management plans discussed with the patient, family and they are in agreement.  CODE STATUS: DNR.  TOTAL TIME TAKING CARE OF THIS PATIENT: 39 minutes.   POSSIBLE D/C IN 2-3 DAYS, DEPENDING ON CLINICAL CONDITION.   Shaune Pollack M.D on 08/26/2014 at 1:03 PM  Between 7am to 6pm - Pager - [phone]  After 6pm go to [email or web address] - password [email or web address] [facility]  [place] [facility]  Office  [phone]  CC: Primary care physician; Leim Fabry, MD

## 2014-08-26 NOTE — Care Management Note (Signed)
Case Management Note  Patient Details  Name: Nicolas Morris MRN: [id number] Date of Birth: 12-Mar-1933  Subjective/Objective:   Patient is from [facility]. Plan is to return to facility at discharge. GI bleed s/p 3 units total. HGB 7.5 today. GI consulted. It is anticipated that patient may be stable for discharge Monday.                  Action/Plan:   Expected Discharge Date:                  Expected Discharge Plan:  Assisted Living / Rest Home  In-House Referral:  Clinical Social Work  Discharge planning Services     Post Acute Care Choice:    Choice offered to:     DME Arranged:    DME Agency:     HH Arranged:    HH Agency:     Status of Service:     Medicare Important Message Given:    Date Medicare IM Given:    Medicare IM give by:    Date Additional Medicare IM Given:    Additional Medicare Important Message give by:     If discussed at Long Length of Stay Meetings, dates discussed:    Additional Comments:  Marily Memos, RN 08/26/2014, 1:50 PM

## 2014-08-27 LAB — BASIC METABOLIC PANEL WITH GFR
Anion gap: 6 (ref 5–15)
BUN: 15 mg/dL (ref 6–20)
CO2: 26 mmol/L (ref 22–32)
Calcium: 8.5 mg/dL — ABNORMAL LOW (ref 8.9–10.3)
Chloride: 103 mmol/L (ref 101–111)
Creatinine, Ser: 0.94 mg/dL (ref 0.61–1.24)
GFR calc Af Amer: >60 mL/min
GFR calc non Af Amer: >60 mL/min
Glucose, Bld: 123 mg/dL — ABNORMAL HIGH (ref 65–99)
Potassium: 4 mmol/L (ref 3.5–5.1)
Sodium: 135 mmol/L (ref 135–145)

## 2014-08-27 LAB — CBC
HEMATOCRIT: 23.5 % — AB (ref 40.0–52.0)
Hemoglobin: 7.1 g/dL — ABNORMAL LOW (ref 13.0–18.0)
MCH: 18.3 pg — ABNORMAL LOW (ref 26.0–34.0)
MCHC: 30.3 g/dL — ABNORMAL LOW (ref 32.0–36.0)
MCV: 60.4 fL — ABNORMAL LOW (ref 80.0–100.0)
Platelets: 247 10*3/uL (ref 150–440)
RBC: 3.9 MIL/uL — ABNORMAL LOW (ref 4.40–5.90)
RDW: 26.2 % — ABNORMAL HIGH (ref 11.5–14.5)
WBC: 7.8 10*3/uL (ref 3.8–10.6)

## 2014-08-27 LAB — MAGNESIUM: Magnesium: 2.1 mg/dL (ref 1.7–2.4)

## 2014-08-27 NOTE — Evaluation (Signed)
Physical Therapy Evaluation Patient Details Name: Nicolas Morris MRN: [id number] DOB: 03-17-1933 Today's Date: 08/27/2014   History of Present Illness  Pt is a 81 y.o. male presenting with SOB for 2 weeks.  In ED, pt found to have significant anemia with Hg of 5.7.  Pt admitted and s/p 2 units PRBC's transfusion.  Pt with elevated troponin (per cardiology consult, d/t demand ischemia).  Clinical Impression  Currently pt demonstrates impairments with activity tolerance and limitations with functional mobility.  Prior to admission, pt was independent ambulating with rollator or SPC depending on the situation/day.  Pt is a resident at the [facility] (ALF) and receives assist for bathing and goes to the dining hall for meals.  Currently pt is CGA with transfers and ambulation with RW (distance limited with ambulation d/t SOB).  Pt would benefit from skilled PT to address above noted impairments and functional limitations.  Recommend pt discharge to home (ALF) with previous PT services when medically appropriate.     Follow Up Recommendations Home health PT (return to prior level of services pt already receiving at his facility)    Equipment Recommendations       Recommendations for Other Services       Precautions / Restrictions Precautions Precautions: Fall Restrictions Weight Bearing Restrictions: No      Mobility  Bed Mobility Overal bed mobility: Needs Assistance Bed Mobility: Supine to Sit     Supine to sit: Supervision        Transfers Overall transfer level: Needs assistance Equipment used: Rolling walker (2 wheeled) Transfers: Sit to/from Stand Sit to Stand: Min guard         General transfer comment: steady without loss of balance  Ambulation/Gait Ambulation/Gait assistance: Min guard Ambulation Distance (Feet): 35 Feet Assistive device: Rolling walker (2 wheeled)       General Gait Details: step through reciprocal gait; mild decreased cadence but  steady  Stairs            Wheelchair Mobility    Modified Rankin (Stroke Patients Only)       Balance Overall balance assessment: Needs assistance Sitting-balance support: No upper extremity supported;Feet supported Sitting balance-Leahy Scale: Normal     Standing balance support: Bilateral upper extremity supported Standing balance-Leahy Scale: Good                               Pertinent Vitals/Pain Pain Assessment: No/denies pain  Pre-activity pt's HR 75 bpm and O2 94% on room air. Post-activity pt's HR 82 bpm and O2 96% on room air.    Home Living Family/patient expects to be discharged to:: Assisted living               Home Equipment: Walker - 4 wheels;Cane - single point      Prior Function Level of Independence: Independent with assistive device(s);Needs assistance         Comments: Switches between [facility] and rollator depending on how he feels for ambulation;   receives assist for bathing and gets meals from dining room     Hand Dominance        Extremity/Trunk Assessment   Upper Extremity Assessment: Overall WFL for tasks assessed           Lower Extremity Assessment: Overall WFL for tasks assessed         Communication   Communication: No difficulties  Cognition Arousal/Alertness: Awake/alert Behavior During Therapy: WFL for tasks assessed/performed  Overall Cognitive Status: Within Functional Limits for tasks assessed                      General Comments  Nursing cleared pt for participation in physical therapy.  Pt agreeable to PT session.    Exercises        Assessment/Plan    PT Assessment Patient needs continued PT services  PT Diagnosis Difficulty walking   PT Problem List Decreased activity tolerance;Cardiopulmonary status limiting activity;Decreased mobility  PT Treatment Interventions DME instruction;Gait training;Functional mobility training;Therapeutic activities;Therapeutic  exercise;Balance training;Patient/family education   PT Goals (Current goals can be found in the Care Plan section) Acute Rehab PT Goals Patient Stated Goal: To go home PT Goal Formulation: With patient Time For Goal Achievement: 09/10/14 Potential to Achieve Goals: Good    Frequency Min 2X/week   Barriers to discharge        Co-evaluation               End of Session Equipment Utilized During Treatment: Gait belt Activity Tolerance: Other (comment) (Distance limited d/t SOB with distance ambulated) Patient left: in chair;with call bell/phone within reach;with chair alarm set Nurse Communication: Mobility status         Time: 9470-9628 PT Time Calculation (min) (ACUTE ONLY): 19 min   Charges:   PT Evaluation $Initial PT Evaluation Tier I: 1 Procedure     PT G CodesHendricks Limes 09-26-2014, 6:12 PM Hendricks Limes, PT [phone]

## 2014-08-27 NOTE — Progress Notes (Signed)
[facility] Physicians - [place] at [facility]   PATIENT NAME: Nicolas Morris    MR#:  [id number]  DATE OF BIRTH:  16-Oct-1933  SUBJECTIVE:  CHIEF COMPLAINT:   Chief Complaint  Patient presents with  . Shortness of Breath  Weakness, SOB is better.  Not working. He had his physical therapy . S/p 2 units PRBC transfusion. Hemoglobin level is low but stable. No recurrent bleeding.   REVIEW OF SYSTEMS:  CONSTITUTIONAL: No fever, generalized weakness.  EYES: No blurred or double vision.  EARS, NOSE, AND THROAT: No tinnitus or ear pain.  RESPIRATORY: No cough, shortness of breath, wheezing or hemoptysis.  CARDIOVASCULAR: No chest pain, orthopnea, edema.  GASTROINTESTINAL: No nausea, vomiting, diarrhea or abdominal pain. No melena or bloody stool since admission.  GENITOURINARY: No dysuria, hematuria.  ENDOCRINE: No polyuria, nocturia,  HEMATOLOGY: No anemia, easy bruising or bleeding SKIN: No rash or lesion. MUSCULOSKELETAL: No joint pain or arthritis.   NEUROLOGIC: No tingling, numbness, weakness.  PSYCHIATRY: No anxiety or depression.   DRUG ALLERGIES:   Allergies  Allergen Reactions  . No Known Allergies     VITALS:  Blood pressure 133/57, pulse 76, temperature 98.7 F (37.1 C), temperature source Oral, resp. rate 18, height  (1.88 m), weight 108.773 kg (239 lb 12.8 oz), SpO2 100 %.  PHYSICAL EXAMINATION:  GENERAL:  81 y.o.-year-old patient lying in the bed with no acute distress.  EYES: Pupils equal, round, reactive to light and accommodation. No scleral icterus. Extraocular muscles intact. Pale conjunctiva. HEENT: Head atraumatic, normocephalic. Oropharynx and nasopharynx clear.  NECK:  Supple, no jugular venous distention. No thyroid enlargement, no tenderness.  LUNGS: Normal breath sounds bilaterally, no wheezing, rales,rhonchi or crepitation. No use of accessory muscles of respiration.  CARDIOVASCULAR: S1, S2 normal. No murmurs, rubs, or gallops.   ABDOMEN: Soft, nontender, nondistended. Bowel sounds present. No organomegaly or mass.  EXTREMITIES: No pedal edema, cyanosis, or clubbing.  NEUROLOGIC: Cranial nerves II through XII are intact. Muscle strength 5/5 in all extremities. Sensation intact. Gait not checked.  PSYCHIATRIC: The patient is alert and oriented x 3.  SKIN: No obvious rash, lesion, or ulcer.    LABORATORY PANEL:   CBC  Recent Labs Lab 08/27/14 0411  WBC 7.8  HGB 7.1*  HCT 23.5*  PLT 247   ------------------------------------------------------------------------------------------------------------------  Chemistries   Recent Labs Lab 08/27/14 0411  NA 135  K 4.0  CL 103  CO2 26  GLUCOSE 123*  BUN 15  CREATININE 0.94  CALCIUM 8.5*  MG 2.1   ------------------------------------------------------------------------------------------------------------------  Cardiac Enzymes  Recent Labs Lab 08/26/14 0238  TROPONINI 0.05*   ------------------------------------------------------------------------------------------------------------------  RADIOLOGY:  Dg Chest 2 View  08/25/2014   CLINICAL DATA:  Dyspnea on exertion for the past month, worsening over the past 2 days. Ex-smoker.  EXAM: CHEST  2 VIEW  COMPARISON:  06/03/2014.  FINDINGS: Stable enlarged cardiac silhouette and left subclavian pacer and AICD leads. The lungs remain mildly hyperexpanded with mildly prominent interstitial markings. Interval small right pleural effusion and minimal adjacent parenchymal density. Diffuse osteopenia.  IMPRESSION: 1. Interval small right pleural effusion and minimal adjacent atelectasis. 2. Stable cardiomegaly and mild changes of COPD.   Electronically Signed   By: Beckie Salts M.D.   On: 08/25/2014 18:14    EKG:   Orders placed or performed during the hospital encounter of 08/25/14  . EKG 12-Lead  . EKG 12-Lead    ASSESSMENT AND PLAN:   1. Acute posthemorrhagic anemia.  S/p  2 units of packed red blood  cells, Hb 7.1, no recurrent bleeding , we will start patient on iron supplementation.  2. Gastrointestinal bleed.  Continue  Protonix IV twice daily. GI consultation is appreciated, colonoscopy as well as EGD on Monday is planned. Holding aspirin and Xarelto is being discontinued, per cardiology recommendations  3. Hyponatremia, likely due to some element of dehydration, holding diuretics. Improved with blood transfusion  4. Chest pain pain with elevated troponin, likely due to demand ischemia, continue Coreg. Holding aspirin due to GI bleed, appreciate Dr. Lady Gary evaluation  5. General weakness. Get physical therapist evaluation, although patient will be discharged to skilled nursing facility   All the records are reviewed and case discussed with Care Management/Social Workerr. Management plans discussed with the patient, family and they are in agreement.  CODE STATUS: DNR.  TOTAL TIME TAKING CARE OF THIS PATIENT: .   POSSIBLE D/C IN 2-3 DAYS, DEPENDING ON CLINICAL CONDITION.   Katharina Caper M.D on 08/27/2014 at 12:34 PM  Between 7am to 6pm - Pager - [phone]  After 6pm go to [email or web address] - password [email or web address] [facility]  [place] [facility]  Office  [phone]  CC: Primary care physician; Leim Fabry, MD

## 2014-08-28 LAB — TYPE AND SCREEN
ABO/RH(D): A POS
ANTIBODY SCREEN: NEGATIVE
Unit division: 0
Unit division: 0

## 2014-08-28 LAB — HEMOGLOBIN: HEMOGLOBIN: 7.4 g/dL — AB (ref 13.0–18.0)

## 2014-08-28 MED ORDER — PEG 3350-KCL-NA BICARB-NACL 420 G PO SOLR
4000.0000 mL | Freq: Once | ORAL | Status: AC
  Administered 2014-08-28: 4000 mL via ORAL
  Filled 2014-08-28: qty 4000

## 2014-08-28 NOTE — Progress Notes (Signed)
Pt adamently refuses bed & chair alarm.  Education done with patient regarding safety.  Pt continues to refuse alarm. Cristela Felt, RN

## 2014-08-28 NOTE — Progress Notes (Signed)
EGD / Colon tomorrow for IDA.  Nulyte 4L prep tonight NPO after mn.  

## 2014-08-28 NOTE — Progress Notes (Signed)
[facility] Physicians - [place] at [facility]   PATIENT NAME: Nicolas Morris    MR#:  [id number]  DATE OF BIRTH:  Jul 05, 1933  SUBJECTIVE:  CHIEF COMPLAINT:   Chief Complaint  Patient presents with  . Shortness of Breath  Weakness, SOB is better.  Physical therapy yesterday recommended skilled nursing facility, rehabilitation placement. However, today he seemed to be improved since assisted living facility discharge was recommended with home health physical therapy. S/p 2 units PRBC transfusion. Hemoglobin level is low but stable. No recurrent bleeding. Feels good today and is ready to go home  REVIEW OF SYSTEMS:  CONSTITUTIONAL: No fever, generalized weakness.  EYES: No blurred or double vision.  EARS, NOSE, AND THROAT: No tinnitus or ear pain.  RESPIRATORY: No cough, shortness of breath, wheezing or hemoptysis.  CARDIOVASCULAR: No chest pain, orthopnea, edema.  GASTROINTESTINAL: No nausea, vomiting, diarrhea or abdominal pain. No melena or bloody stool since admission.  GENITOURINARY: No dysuria, hematuria.  ENDOCRINE: No polyuria, nocturia,  HEMATOLOGY: No anemia, easy bruising or bleeding SKIN: No rash or lesion. MUSCULOSKELETAL: No joint pain or arthritis.   NEUROLOGIC: No tingling, numbness, weakness.  PSYCHIATRY: No anxiety or depression.   DRUG ALLERGIES:   Allergies  Allergen Reactions  . No Known Allergies     VITALS:  Blood pressure 120/52, pulse 76, temperature 97.7 F (36.5 C), temperature source Oral, resp. rate 19, height 6\' 2"  (1.88 m), weight 108.773 kg (239 lb 12.8 oz), SpO2 97 %.  PHYSICAL EXAMINATION:  GENERAL:  81 y.o.-year-old patient sitting on the edge of the bed with no acute distress. Comfortable and smiling EYES: Pupils equal, round, reactive to light and accommodation. No scleral icterus. Extraocular muscles intact. Pale conjunctiva. HEENT: Head atraumatic, normocephalic. Oropharynx and nasopharynx clear.  NECK:  Supple, no jugular  venous distention. No thyroid enlargement, no tenderness.  LUNGS: Normal breath sounds bilaterally, no wheezing, rales,rhonchi or crepitation. No use of accessory muscles of respiration.  CARDIOVASCULAR: S1, S2 normal. No murmurs, rubs, or gallops.  ABDOMEN: Soft, nontender, nondistended. Bowel sounds present. No organomegaly or mass.  EXTREMITIES: No pedal edema, cyanosis, or clubbing.  NEUROLOGIC: Cranial nerves II through XII are intact. Muscle strength 5/5 in all extremities. Sensation intact. Gait not checked.  PSYCHIATRIC: The patient is alert and oriented x 3.  SKIN: No obvious rash, lesion, or ulcer.    LABORATORY PANEL:   CBC  Recent Labs Lab 08/27/14 0411 08/28/14 0519  WBC 7.8  --   HGB 7.1* 7.4*  HCT 23.5*  --   PLT 247  --    ------------------------------------------------------------------------------------------------------------------  Chemistries   Recent Labs Lab 08/27/14 0411  NA 135  K 4.0  CL 103  CO2 26  GLUCOSE 123*  BUN 15  CREATININE 0.94  CALCIUM 8.5*  MG 2.1   ------------------------------------------------------------------------------------------------------------------  Cardiac Enzymes  Recent Labs Lab 08/26/14 0238  TROPONINI 0.05*   ------------------------------------------------------------------------------------------------------------------  RADIOLOGY:  No results found.  EKG:   Orders placed or performed during the hospital encounter of 08/25/14  . EKG 12-Lead  . EKG 12-Lead    ASSESSMENT AND PLAN:   1. Acute posthemorrhagic anemia.  S/p  2 units of packed red blood cells, Hb 7.4, no recurrent bleeding , start patient on iron supplementation upon discharge home.  2. Gastrointestinal bleed.  Continue  Protonix IV twice daily. GI consultation is appreciated, colonoscopy as well as EGD on Monday is planned. Holding aspirin , Xarelto is being discontinued, per cardiology recommendations. No recurrent  bleeding  3. Hyponatremia, likely due to some element of dehydration, holding diuretics. Improved with blood transfusion  4. Chest pain pain with elevated troponin, likely due to demand ischemia, continue Coreg. Holding aspirin due to GI bleed, appreciate Dr. Lady Gary input  5. General weakness. Physical therapist recommended assisted living facility discharge with home health physical therapy   All the records are reviewed and case discussed with Care Management/Social Workerr. Management plans discussed with the patient, family and they are in agreement.  CODE STATUS: DNR.  TOTAL TIME TAKING CARE OF THIS PATIENT: 35 minutes.   POSSIBLE D/C IN 2-3 DAYS, DEPENDING ON CLINICAL CONDITION.   Katharina Caper M.D on 08/28/2014 at 1:37 PM  Between 7am to 6pm - Pager - [phone]  After 6pm go to [email or web address] - password [email or web address] [facility]  [place] [facility]  Office  [phone]  CC: Primary care physician; Leim Fabry, MD

## 2014-08-28 NOTE — Progress Notes (Signed)
Spoke with Dr Shelle Iron about pt only being halfway through golytely.  Stated pt can drink golytely until 0300 to 0400.  Cristela Felt, RN

## 2014-08-29 ENCOUNTER — Encounter
Admission: EM | Disposition: A | Discharge status: Other Acute Inpatient Hospital | Payer: Self-pay | Source: Home / Self Care | Attending: Internal Medicine

## 2014-08-29 ENCOUNTER — Inpatient Hospital Stay: Payer: Medicare Other | Admitting: Anesthesiology

## 2014-08-29 ENCOUNTER — Encounter: Payer: Self-pay | Admitting: *Deleted

## 2014-08-29 HISTORY — PX: ESOPHAGOGASTRODUODENOSCOPY (EGD) WITH PROPOFOL: SHX5813

## 2014-08-29 HISTORY — PX: COLONOSCOPY WITH PROPOFOL: SHX5780

## 2014-08-29 LAB — HEMOGLOBIN: HEMOGLOBIN: 8.4 g/dL — AB (ref 13.0–18.0)

## 2014-08-29 SURGERY — IMAGING PROCEDURE, GI TRACT, INTRALUMINAL, VIA CAPSULE

## 2014-08-29 SURGERY — ESOPHAGOGASTRODUODENOSCOPY (EGD) WITH PROPOFOL
Anesthesia: General

## 2014-08-29 MED ORDER — PROPOFOL INFUSION 10 MG/ML OPTIME
INTRAVENOUS | Status: DC | PRN
  Administered 2014-08-29: 120 ug/kg/min via INTRAVENOUS

## 2014-08-29 MED ORDER — PROPOFOL 10 MG/ML IV BOLUS
INTRAVENOUS | Status: DC | PRN
  Administered 2014-08-29: 98 mg via INTRAVENOUS

## 2014-08-29 MED ORDER — SODIUM CHLORIDE 0.9 % IV SOLN: INTRAVENOUS | Status: DC

## 2014-08-29 MED ORDER — MIDAZOLAM HCL 2 MG/2ML IJ SOLN
INTRAMUSCULAR | Status: DC | PRN
  Administered 2014-08-29: 1 mg via INTRAVENOUS

## 2014-08-29 MED ORDER — FERROUS SULFATE 325 (65 FE) MG PO TABS
325.0000 mg | ORAL_TABLET | Freq: Two times a day (BID) | ORAL | Status: DC
  Administered 2014-08-29 – 2014-08-30 (×2): 325 mg via ORAL
  Filled 2014-08-29 (×2): qty 1

## 2014-08-29 MED ORDER — FENTANYL CITRATE (PF) 100 MCG/2ML IJ SOLN
INTRAMUSCULAR | Status: DC | PRN
  Administered 2014-08-29: 50 ug via INTRAVENOUS

## 2014-08-29 MED ORDER — SODIUM CHLORIDE 0.9 % IV SOLN
INTRAVENOUS | Status: DC
  Administered 2014-08-29: 11:00:00 via INTRAVENOUS

## 2014-08-29 NOTE — Transfer of Care (Signed)
Immediate Anesthesia Transfer of Care Note  Patient: Nicolas Morris  Procedure(s) Performed: Procedure(s): ESOPHAGOGASTRODUODENOSCOPY (EGD) WITH PROPOFOL (N/A) COLONOSCOPY WITH PROPOFOL (N/A)  Patient Location: PACU  Anesthesia Type:General  Level of Consciousness: sedated  Airway & Oxygen Therapy: Patient Spontanous Breathing  Post-op Assessment: Report given to RN  Post vital signs: Reviewed  Last Vitals:  Filed Vitals:   08/29/14 1052  BP: 158/68  Pulse: 77  Temp: 37.6 C  Resp: 16    Complications: No apparent anesthesia complications

## 2014-08-29 NOTE — Interval H&P Note (Signed)
History and Physical Interval Note:  08/29/2014 11:47 AM  Nicolas Morris  has presented today for surgery, with the diagnosis of ida  The various methods of treatment have been discussed with the patient and family. After consideration of risks, benefits and other options for treatment, the patient has consented to  Procedure(s): ESOPHAGOGASTRODUODENOSCOPY (EGD) WITH PROPOFOL (N/A) COLONOSCOPY WITH PROPOFOL (N/A) as a surgical intervention .  The patient's history has been reviewed, patient examined, no change in status, stable for surgery.  I have reviewed the patient's chart and labs.  Questions were answered to the patient's satisfaction.     Lindalee Huizinga GORDON

## 2014-08-29 NOTE — Care Management (Addendum)
Patient confirms that he is a resident of [facility] [address] [facility].  He is currently receiving physical therapy services through [facility]  Patient will benefit from nursing for skilled observation/assessment.  EGD and colonoscopy findings did not show obvious etiology of bleeding.  Anticipate patient will be able to return to the assisted living level of care with home health but  physical therapy consult is pending.  Patient declined evaluation today

## 2014-08-29 NOTE — Progress Notes (Signed)
PT Refusal Note  Patient Details Name: Nicolas Morris MRN: [id number] DOB: 1933-09-17   Cancelled Treatment:    Reason Eval/Treat Not Completed: Patient declined, no reason specified. Chart reviewed and RN consulted. Pt had endoscopy this AM. Attempted to work with patient but he refuses therapy at this time. Encouraged bed exercises at a minimum but pt continues to refuse. Will attempt on later date/time as pt is available/willing.  Sharalyn Ink Kennedi Lizardo PT, DPT   Ocie Tino 08/29/2014, 3:44 PM

## 2014-08-29 NOTE — Anesthesia Preprocedure Evaluation (Signed)
Anesthesia Evaluation  Patient identified by MRN, date of birth, ID band Patient awake    Reviewed: Allergy & Precautions, NPO status , Patient's Chart, lab work & pertinent test results  History of Anesthesia Complications Negative for: history of anesthetic complications  Airway Mallampati: III  TM Distance: >3 FB Neck ROM: Full    Dental  (+) Upper Dentures, Lower Dentures   Pulmonary former smoker,          Cardiovascular hypertension, Pt. on medications and Pt. on home beta blockers + CAD and +CHF (LVEF <30%) + pacemaker + Cardiac Defibrillator     Neuro/Psych Depression    GI/Hepatic hiatal hernia,   Endo/Other  diabetes, Type 2, Oral Hypoglycemic Agents  Renal/GU      Musculoskeletal   Abdominal   Peds  Hematology  (+) anemia ,   Anesthesia Other Findings   Reproductive/Obstetrics                             Anesthesia Physical Anesthesia Plan  ASA: III  Anesthesia Plan: General   Post-op Pain Management:    Induction: Intravenous  Airway Management Planned: Nasal Cannula  Additional Equipment:   Intra-op Plan:   Post-operative Plan:   Informed Consent: I have reviewed the patients History and Physical, chart, labs and discussed the procedure including the risks, benefits and alternatives for the proposed anesthesia with the patient or authorized representative who has indicated his/her understanding and acceptance.     Plan Discussed with:   Anesthesia Plan Comments:         Anesthesia Quick Evaluation

## 2014-08-29 NOTE — Op Note (Signed)
[facility] Gastroenterology Patient Name: Nicolas Morris Procedure Date: 08/29/2014 11:42 AM MRN: [id number] Account #: [id number] Date of Birth: 04-04-1933 Admit Type: Outpatient Age: 81 Room: [facility] ENDO ROOM 4 Gender: Male Note Status: Finalized Procedure:         Colonoscopy Indications:       Iron deficiency anemia Patient Profile:   This is a 81 year old male. Providers:         Rhona Raider. Shelle Iron, MD Referring MD:      Katharina Caper, MD (Referring MD) Medicines:         Propofol per Anesthesia Complications:     No immediate complications. Procedure:         Pre-Anesthesia Assessment:                    - Prior to the procedure, a History and Physical was                     performed, and patient medications, allergies and                     sensitivities were reviewed. The patient's tolerance of                     previous anesthesia was reviewed.                    After obtaining informed consent, the colonoscope was                     passed under direct vision. Throughout the procedure, the                     patient's blood pressure, pulse, and oxygen saturations                     were monitored continuously. The Colonoscope was                     introduced through the anus and advanced to the the cecum,                     identified by appendiceal orifice and ileocecal valve. The                     colonoscopy was performed without difficulty. The patient                     tolerated the procedure well. The quality of the bowel                     preparation was good. Findings:      The perianal exam findings include non-thrombosed external hemorrhoids.      Many small and large-mouthed diverticula were found in the sigmoid colon.      The terminal ileum appeared normal.      The exam was otherwise without abnormality on direct and retroflexion       views. Impression:        - Non-thrombosed external hemorrhoids found on perianal               exam.                    - Diverticulosis in the sigmoid colon.                    -  The examined portion of the ileum was normal.                    - The examination was otherwise normal on direct and                     retroflexion views.                    - No specimens collected. Recommendation:    - Perform an upper GI endoscopy today. Procedure Code(s): --- Professional ---                    [phone], Colonoscopy, flexible; diagnostic, including                     collection of specimen(s) by brushing or washing, when                     performed (separate procedure) CPT copyright 2014 American [employer] Association. All rights reserved. The codes documented in this report are preliminary and upon coder review may  be revised to meet current compliance requirements. Kathalene Frames, MD 08/29/2014 12:11:00 PM This report has been signed electronically. Number of Addenda: 0 Note Initiated On: 08/29/2014 11:42 AM Scope Withdrawal Time: 0 hours 11 minutes 53 seconds  Total Procedure Duration: 0 hours 15 minutes 10 seconds       [facility]

## 2014-08-29 NOTE — Op Note (Signed)
[facility]astroenterology Patient Name: Nicolas Morris Procedure Date: 08/29/2014 11:41 AM MRN: [id number] Account #: [id number] Date of Birth: 25-Sep-1933 Admit Type: Outpatient Age: 81 Room: [facility] ENDO ROOM 4 Gender: Male Note Status: Finalized Procedure:         Upper GI endoscopy Indications:       Iron deficiency anemia Patient Profile:   This is a 81 year old male. Providers:         Rhona Raider. Shelle Iron, MD Referring MD:      Katharina Caper, MD (Referring MD) Medicines:         Propofol per Anesthesia Complications:     No immediate complications. Procedure:         Pre-Anesthesia Assessment:                    - Prior to the procedure, a History and Physical was                     performed, and patient medications, allergies and                     sensitivities were reviewed. The patient's tolerance of                     previous anesthesia was reviewed.                    After obtaining informed consent, the endoscope was passed                     under direct vision. Throughout the procedure, the                     patient's blood pressure, pulse, and oxygen saturations                     were monitored continuously. The Endoscope was introduced                     through the mouth, and advanced to the second part of                     duodenum. The upper GI endoscopy was accomplished without                     difficulty. The patient tolerated the procedure well. Findings:      The esophagus was normal.      Scattered mild inflammation characterized by erythema was found in the       gastric antrum.      The examined duodenum was normal.      Using the endoscope, the video capsule enteroscope was advanced into the       duodenal bulb. Impression:        - Normal esophagus.                    - Gastritis.                    - Normal examined duodenum.                    - Successful completion of the Video Capsule Enteroscope     placement.                    -  No specimens collected. Recommendation:    - Return patient to hospital ward for ongoing care.                    - Continue present medications.                    - f/u capusle endoscopy findings                    - if capsule is negative, ok restart xarelta ( minimal                     risk of bleeding with negative EGD / colon / capsule)                    - The findings and recommendations were discussed with the                     patient. Procedure Code(s): --- Professional ---                    [phone], Esophagogastroduodenoscopy, flexible, transoral;                     diagnostic, including collection of specimen(s) by                     brushing or washing, when performed (separate procedure) CPT copyright 2014 American Medical Association. All rights reserved. The codes documented in this report are preliminary and upon coder review may  be revised to meet current compliance requirements. Kathalene Frames, MD 08/29/2014 12:42:13 PM This report has been signed electronically. Number of Addenda: 0 Note Initiated On: 08/29/2014 11:41 AM      [facility]

## 2014-08-29 NOTE — Progress Notes (Signed)
[facility] - [place] at [facility]   PATIENT NAME: Nicolas Morris    MR#:  [id number]  DATE OF BIRTH:  October 20, 1933  SUBJECTIVE:  CHIEF COMPLAINT:   Chief Complaint  Patient presents with  . Shortness of Breath   patient feels good today. No recurrent bleeding. Hemoglobin level is stable after 2 units of blood cell transfusion. Physical therapist recommended to return back to assisted living facility after procedures, likely tomorrow  REVIEW OF SYSTEMS:  CONSTITUTIONAL: No fever, generalized weakness.  EYES: No blurred or double vision.  EARS, NOSE, AND THROAT: No tinnitus or ear pain.  RESPIRATORY: No cough, shortness of breath, wheezing or hemoptysis.  CARDIOVASCULAR: No chest pain, orthopnea, edema.  GASTROINTESTINAL: No nausea, vomiting, diarrhea or abdominal pain. No melena or bloody stool since admission.  GENITOURINARY: No dysuria, hematuria.  ENDOCRINE: No polyuria, nocturia,  HEMATOLOGY: No anemia, easy bruising or bleeding SKIN: No rash or lesion. MUSCULOSKELETAL: No joint pain or arthritis.   NEUROLOGIC: No tingling, numbness, weakness.  PSYCHIATRY: No anxiety or depression.   DRUG ALLERGIES:   Allergies  Allergen Reactions  . No Known Allergies     VITALS:  Blood pressure 158/68, pulse 77, temperature 99.6 F (37.6 C), temperature source Tympanic, resp. rate 16, height  (1.88 m), weight 108.773 kg (239 lb 12.8 oz), SpO2 96 %.  PHYSICAL EXAMINATION:  GENERAL:  81 y.o.-year-old patient sitting on the edge of the bed with no acute distress. Comfortable and smiling EYES: Pupils equal, round, reactive to light and accommodation. No scleral icterus. Extraocular muscles intact. Pale conjunctiva. HEENT: Head atraumatic, normocephalic. Oropharynx and nasopharynx clear.  NECK:  Supple, no jugular venous distention. No thyroid enlargement, no tenderness.  LUNGS: Normal breath sounds bilaterally, no wheezing, rales,rhonchi or crepitation. No  use of accessory muscles of respiration.  CARDIOVASCULAR: S1, S2 normal. No murmurs, rubs, or gallops.  ABDOMEN: Soft, nontender, nondistended. Bowel sounds present. No organomegaly or mass.  EXTREMITIES: No pedal edema, cyanosis, or clubbing.  NEUROLOGIC: Cranial nerves II through XII are intact. Muscle strength 5/5 in all extremities. Sensation intact. Gait not checked.  PSYCHIATRIC: The patient is alert and oriented x 3.  SKIN: No obvious rash, lesion, or ulcer.    LABORATORY PANEL:   CBC  Recent Labs Lab 08/27/14 0411  08/29/14 0538  WBC 7.8  --   --   HGB 7.1*  < > 8.4*  HCT 23.5*  --   --   PLT 247  --   --   < > = values in this interval not displayed. ------------------------------------------------------------------------------------------------------------------  Chemistries   Recent Labs Lab 08/27/14 0411  NA 135  K 4.0  CL 103  CO2 26  GLUCOSE 123*  BUN 15  CREATININE 0.94  CALCIUM 8.5*  MG 2.1   ------------------------------------------------------------------------------------------------------------------  Cardiac Enzymes  Recent Labs Lab 08/26/14 0238  TROPONINI 0.05*   ------------------------------------------------------------------------------------------------------------------  RADIOLOGY:  No results found.  EKG:   Orders placed or performed during the hospital encounter of 08/25/14  . EKG 12-Lead  . EKG 12-Lead    ASSESSMENT AND PLAN:   1. Acute posthemorrhagic anemia.  S/p  2 units of packed red blood cells, Hb 8.4, no recurrent bleeding , start patient on iron supplementation upon discharge home.  2. Gastrointestinal bleed., Unknown etiology at present  Continue  Protonix IV twice daily. GI consultation is appreciated, colonoscopy as well as EGD today is planned. Holding aspirin , Xarelto is being discontinued, per cardiology recommendations. No recurrent  bleeding  3. Hyponatremia, likely due to some element of  dehydration, holding diuretics. Improved with blood transfusion, would resume diuretics upon discharge home  4. Chest pain pain with elevated troponin, likely due to demand ischemia, continue Coreg. Holding aspirin due to GI bleed, appreciate Dr. Lady Gary input  5. General weakness. Physical therapist recommended assisted living facility discharge with home health physical therapy, likely discharge tomorrow, as this can management   All the records are reviewed and case discussed with Care Management/Social Workerr. Management plans discussed with the patient, family and they are in agreement.  CODE STATUS: DNR.  TOTAL TIME TAKING CARE OF THIS PATIENT: 35 minutes.   POSSIBLE D/C IN 2-3 DAYS, DEPENDING ON CLINICAL CONDITION.   Katharina Caper M.D on 08/29/2014 at 11:37 AM  Between 7am to 6pm - Pager - [phone]  After 6pm go to [email or web address] - password [email or web address] [facility]  [facility]  Office  [phone]  CC: Primary care physician; Leim Fabry, MD

## 2014-08-29 NOTE — Progress Notes (Signed)
NO source for bleedign on EGD or colon today.  Capusle endo placed today. Ok to re-start anti-coag if no source on capsule endoscopy.

## 2014-08-29 NOTE — Care Management (Signed)
Important Message  Patient Details  Name: Jaxton Hunsaker MRN: [id number] Date of Birth: Jun 30, 1933   Medicare Important Message Given:  Yes-second notification given    Olegario Messier A Allmond 08/29/2014, 10:35 AM

## 2014-08-29 NOTE — H&P (View-Only) (Signed)
EGD / Colon tomorrow for IDA.  Nulyte 4L prep tonight NPO after mn.

## 2014-08-29 NOTE — Anesthesia Postprocedure Evaluation (Signed)
  Anesthesia Post-op Note  Patient: Nicolas Morris  Procedure(s) Performed: Procedure(s): ESOPHAGOGASTRODUODENOSCOPY (EGD) WITH PROPOFOL (N/A) COLONOSCOPY WITH PROPOFOL (N/A)  Anesthesia type:General  Patient location: PACU  Post pain: Pain level controlled  Post assessment: Post-op Vital signs reviewed, Patient's Cardiovascular Status Stable, Respiratory Function Stable, Patent Airway and No signs of Nausea or vomiting  Post vital signs: Reviewed and stable  Last Vitals:  Filed Vitals:   08/29/14 1052  BP: 158/68  Pulse: 77  Temp: 37.6 C  Resp: 16    Level of consciousness: awake, alert  and patient cooperative  Complications: No apparent anesthesia complications

## 2014-08-30 ENCOUNTER — Encounter: Payer: Self-pay | Admitting: Gastroenterology

## 2014-08-30 DIAGNOSIS — K644 Residual hemorrhoidal skin tags: Secondary | ICD-10-CM

## 2014-08-30 DIAGNOSIS — K922 Gastrointestinal hemorrhage, unspecified: Secondary | ICD-10-CM | POA: Insufficient documentation

## 2014-08-30 DIAGNOSIS — K2970 Gastritis, unspecified, without bleeding: Secondary | ICD-10-CM

## 2014-08-30 DIAGNOSIS — E871 Hypo-osmolality and hyponatremia: Secondary | ICD-10-CM

## 2014-08-30 DIAGNOSIS — R7989 Other specified abnormal findings of blood chemistry: Secondary | ICD-10-CM

## 2014-08-30 DIAGNOSIS — K5730 Diverticulosis of large intestine without perforation or abscess without bleeding: Secondary | ICD-10-CM

## 2014-08-30 DIAGNOSIS — K2990 Gastroduodenitis, unspecified, without bleeding: Secondary | ICD-10-CM

## 2014-08-30 DIAGNOSIS — R778 Other specified abnormalities of plasma proteins: Secondary | ICD-10-CM

## 2014-08-30 MED ORDER — FERROUS SULFATE 325 (65 FE) MG PO TABS: 325.0000 mg | ORAL_TABLET | Freq: Two times a day (BID) | ORAL | Status: DC

## 2014-08-30 NOTE — Care Management (Signed)
[facility] home health arranged for this patient through [facility]. He is from [facility] and states he has had PT a couple times per week but unsure of agency name. Patient states he usually ambulates with walker at facility. CSW updated. Patient discharge today. [facility] notified.

## 2014-08-30 NOTE — Clinical Social Work Note (Signed)
CSW notified RN and facility that pt would DC today.  Facility will transport back to [facility].

## 2014-08-30 NOTE — Discharge Summary (Signed)
[facility] - [place] at [facility]   PATIENT NAME: Nicolas Morris    MR#:  [id number]  DATE OF BIRTH:  [birth year]  DATE OF ADMISSION:  08/25/2014 ADMITTING PHYSICIAN: Katharina Caper, MD  DATE OF DISCHARGE: No discharge date for patient encounter.  PRIMARY CARE PHYSICIAN: ALDRIDGE,BARBARA, MD     ADMISSION DIAGNOSIS:  Symptomatic anemia [D64.9] Gastrointestinal hemorrhage, unspecified gastritis, unspecified gastrointestinal hemorrhage type [K92.2]  DISCHARGE DIAGNOSIS:  Active Problems:   Acute posthemorrhagic anemia   GIB (gastrointestinal bleeding)   Hyponatremia   Elevated troponin   External hemorrhoids   Diverticulosis of colon without hemorrhage   Gastritis and gastroduodenitis   SECONDARY DIAGNOSIS:   Past Medical History  Diagnosis Date  . Non-obstructive CAD     a. s/p multiple caths @ [facility] - last reportedly 06/2012;  b. 06/2013 Lexi MV: EF 51%, no ischemia/infarct.  Marland Kitchen NICM (nonischemic cardiomyopathy)     a. EF reportedly < 30% 01/2013 ([facility]);  b. 01/2012 s/p MDT DTBB1D1 Viva S CRT-D;  c. 12/2012 Echo: EF 45-50%, inf/post HK, nl RV, mild MR.  . Diabetes mellitus without complication   . A-fib     a. Permanent, on Xarelto.  . H/O: GI bleed     a. 12/2012 EGD: HH, evidence of reflux. Erosive Gastritis. Nl duodenum;  b. 12/2012 Colonoscopy: Sev sigmoid and desc colon diverticulosis, transverse and ascending colon diverticulosis, nonbleeding internal hemorrhoids, no bleeding.  . Iron deficiency anemia   . Biventricular ICD (implantable cardioverter-defibrillator) -Medtronic 01/12/2013    a. 01/2012 [facility]:  01/2012 s/p MDT DTBB1D1 Viva S CRT-D.  Atrial port capped (afib).  . Diverticulosis     a. 12/2012 noted on colonoscopy.  . Hiatal hernia     a. 12/2012 noted on EGD.  Marland Kitchen Gastritis     a. 12/2012 noted on EGD.  Marland Kitchen Hypertension   . AICD (automatic cardioverter/defibrillator) present   . Presence of permanent cardiac pacemaker      .pro HOSPITAL COURSE:  Patient is a 81 year old male with history of A. fib who was on Xarelto for that presents to the hospital with complaints of shortness of breath as well as some chest pains. On arrival to the hospital patient hemoglobin was found to be 6.4. Patient was admitted to the hospital for further evaluation.  He was transfused packed red blood cells after which he's hemoglobin level improved to 8.4 on 08/29/2014. He was seen by the gastroenterologist Dr.Rein who felt the patient needs to have further workup for gastrointestinal bleeding. Patient was taken to the procedure room for EGD and colonoscopy on on 08/29/2014 which revealed external hemorrhoids, colon diverticulosis and gastritis but no bleeding source. Dr. Shelle Iron felt that the patient needs capsule endoscopy and if capsule endoscopy does not show any significant abnormalities patient should resume his Xarelto.  Postprocedure patient felt satisfactory,  he has no complaints and was ready to be discharged home. Discussion by problem: . Acute posthemorrhagic anemia.  S/p 2 units of packed red blood cells, Hb 8.4, no recurrent bleeding , continue patient on iron supplementation upon discharge home. Follow hemoglobin level as outpatient  2. Gastrointestinal bleed., Unknown etiology at present. Status post EGD and colonoscopy 11th of July 2016 revealing external hemorrhoids, colon Diverticulosis and gastritis but no bleeding source, capsule endoscopy was recommended by gastroenterologist. Continue Protonix orally twice daily.  Holding aspirin,  to be restarted in one week,   Xarelto is being held as well until capsule endoscopy is performed ,  per gastroenterology recommendations. No recurrent bleeding  3. Hyponatremia, likely due to some element of dehydration, holding diuretics. Resolved with blood  transfusion, resume diuretics upon discharge home  4. Chest pain pain with elevated troponin, likely due to demand ischemia, continue  Coreg. Holding aspirin due to  Recent GI bleed, appreciate Dr. Lady Gary input  5. General weakness. Physical therapist recommended assisted living facility discharge with home health physical therapy, discharge today   DISCHARGE CONDITIONS:   fair  CONSULTS OBTAINED:  Treatment Team:  Dalia Heading, MD Elnita Maxwell, MD  DRUG ALLERGIES:   Allergies  Allergen Reactions  . No Known Allergies     DISCHARGE MEDICATIONS:   Current Discharge Medication List    START taking these medications   Details  ferrous sulfate 325 (65 FE) MG tablet Take 1 tablet (325 mg total) by mouth 2 (two) times daily with a meal. Qty: 60 tablet, Refills: 3      CONTINUE these medications which have NOT CHANGED   Details  !! acetaminophen (TYLENOL) 500 MG tablet Take 500 mg by mouth 3 (three) times daily.    carvedilol (COREG) 6.25 MG tablet Take 2 tablets (12.5 mg total) by mouth 2 (two) times daily with a meal.   Associated Diagnoses: Chronic combined systolic and diastolic CHF (congestive heart failure); Congestive dilated cardiomyopathy    digoxin (LANOXIN) 0.25 MG tablet Take 0.25 mg by mouth daily.    finasteride (PROSCAR) 5 MG tablet Take 5 mg by mouth daily.    furosemide (LASIX) 40 MG tablet Take 1 tablet (40 mg total) by mouth 2 (two) times daily. Qty: 30 tablet, Refills: 3    gabapentin (NEURONTIN) 300 MG capsule Take 300 mg by mouth 3 (three) times daily.     lisinopril (PRINIVIL,ZESTRIL) 2.5 MG tablet Take 2.5 mg by mouth daily.    lovastatin (MEVACOR) 20 MG tablet Take 20 mg by mouth at bedtime.     meclizine (ANTIVERT) 25 MG tablet Take 25 mg by mouth 3 (three) times daily as needed for dizziness.    metFORMIN (GLUCOPHAGE) 500 MG tablet Take 1,000 mg by mouth 2 (two) times daily with a meal.     pantoprazole (PROTONIX) 40 MG tablet Take 40 mg by mouth daily.     polyethylene glycol (MIRALAX / GLYCOLAX) packet Take 17 g by mouth daily as needed for mild constipation or  moderate constipation.    sertraline (ZOLOFT) 100 MG tablet Take 100 mg by mouth daily.    !! acetaminophen (TYLENOL) 325 MG tablet Take 650 mg by mouth every 4 (four) hours as needed.    potassium chloride (MICRO-K) 10 MEQ CR capsule Take 20 mEq by mouth daily.     traZODone (DESYREL) 50 MG tablet Take 200 mg by mouth at bedtime.      !! - Potential duplicate medications found. Please discuss with provider.    STOP taking these medications     aspirin 81 MG chewable tablet      potassium chloride SA (K-DUR,KLOR-CON) 20 MEQ tablet      Rivaroxaban (XARELTO) 20 MG TABS tablet      aspirin 81 MG tablet          DISCHARGE INSTRUCTIONS:    Follow-up with your primary care physician and gastroenterologist,  follow hemoglobin levels as outpatient. Perform capsule endoscopy as outpatient and get Xarelto restarted if capsule endoscopy is negative   If you experience worsening of your admission symptoms, develop shortness of breath, life threatening emergency,  suicidal or homicidal thoughts you must seek medical attention immediately by calling 911 or calling your MD immediately  if symptoms less severe.  You Must read complete instructions/literature along with all the possible adverse reactions/side effects for all the Medicines you take and that have been prescribed to you. Take any new Medicines after you have completely understood and accept all the possible adverse reactions/side effects.   Please note  You were cared for by a hospitalist during your hospital stay. If you have any questions about your discharge medications or the care you received while you were in the hospital after you are discharged, you can call the unit and asked to speak with the hospitalist on call if the hospitalist that took care of you is not available. Once you are discharged, your primary care physician will handle any further medical issues. Please note that NO REFILLS for any discharge medications will  be authorized once you are discharged, as it is imperative that you return to your primary care physician (or establish a relationship with a primary care physician if you do not have one) for your aftercare needs so that they can reassess your need for medications and monitor your lab values.    Today   CHIEF COMPLAINT:   Chief Complaint  Patient presents with  . Shortness of Breath    HISTORY OF PRESENT ILLNESS:  Nicolas Morris  is a 81 y.o. male with a known history of A. fib who was on Xarelto for that presents to the hospital with complaints of shortness of breath as well as some chest pains. On arrival to the hospital patient hemoglobin was found to be 6.4. Patient was admitted to the hospital for further evaluation.  He was transfused packed red blood cells after which he's hemoglobin level improved to 8.4 on 08/29/2014. He was seen by the gastroenterologist Dr.Rein who felt the patient needs to have further workup for gastrointestinal bleeding. Patient was taken to the procedure room for EGD and colonoscopy on on 08/29/2014 which revealed external hemorrhoids, colon diverticulosis and gastritis but no bleeding source. Dr. Shelle Iron felt that the patient needs capsule endoscopy and if capsule endoscopy does not show any significant abnormalities patient should resume his Xarelto.  Postprocedure patient felt satisfactory,  he has no complaints and was ready to be discharged home. Discussion by problem: . Acute posthemorrhagic anemia.  S/p 2 units of packed red blood cells, Hb 8.4, no recurrent bleeding , continue patient on iron supplementation upon discharge home. Follow hemoglobin level as outpatient  2. Gastrointestinal bleed., Unknown etiology at present. Status post EGD and colonoscopy 11th of July 2016 revealing external hemorrhoids, colon Diverticulosis and gastritis but no bleeding source, capsule endoscopy was recommended by gastroenterologist. Continue Protonix orally twice daily.   Holding aspirin,  to be restarted in one week,   Xarelto is being held as well until capsule endoscopy is performed , per gastroenterology recommendations. No recurrent bleeding  3. Hyponatremia, likely due to some element of dehydration, holding diuretics. Resolved with blood  transfusion, resume diuretics upon discharge home  4. Chest pain pain with elevated troponin, likely due to demand ischemia, continue Coreg. Holding aspirin due to  Recent GI bleed, appreciate Dr. Lady Gary input  5. General weakness. Physical therapist recommended assisted living facility discharge with home health physical therapy, discharge today    VITAL SIGNS:  Blood pressure 121/51, pulse 85, temperature 97.8 F (36.6 C), temperature source Oral, resp. rate 18, height 6\' 2"  (1.88 m), weight 103.284  kg (227 lb 11.2 oz), SpO2 97 %.  I/O:   Intake/Output Summary (Last 24 hours) at 08/30/14 1041 Last data filed at 08/30/14 1039  Gross per 24 hour  Intake    240 ml  Output   1300 ml  Net  -1060 ml    PHYSICAL EXAMINATION:  GENERAL:  81 y.o.-year-old patient lying in the bed with no acute distress.  EYES: Pupils equal, round, reactive to light and accommodation. No scleral icterus. Extraocular muscles intact.  HEENT: Head atraumatic, normocephalic. Oropharynx and nasopharynx clear.  NECK:  Supple, no jugular venous distention. No thyroid enlargement, no tenderness.  LUNGS: Normal breath sounds bilaterally, no wheezing, rales,rhonchi or crepitation. No use of accessory muscles of respiration.  CARDIOVASCULAR: S1, S2 normal. No murmurs, rubs, or gallops.  ABDOMEN: Soft, non-tender, non-distended. Bowel sounds present. No organomegaly or mass.  EXTREMITIES: No pedal edema, cyanosis, or clubbing.  NEUROLOGIC: Cranial nerves II through XII are intact. Muscle strength 5/5 in all extremities. Sensation intact. Gait not checked.  PSYCHIATRIC: The patient is alert and oriented x 3.  SKIN: No obvious rash, lesion, or  ulcer.   DATA REVIEW:   CBC  Recent Labs Lab 08/27/14 0411  08/29/14 0538  WBC 7.8  --   --   HGB 7.1*  < > 8.4*  HCT 23.5*  --   --   PLT 247  --   --   < > = values in this interval not displayed.  Chemistries   Recent Labs Lab 08/27/14 0411  NA 135  K 4.0  CL 103  CO2 26  GLUCOSE 123*  BUN 15  CREATININE 0.94  CALCIUM 8.5*  MG 2.1    Cardiac Enzymes  Recent Labs Lab 08/26/14 0238  TROPONINI 0.05*    Microbiology Results  Results for orders placed or performed during the hospital encounter of 08/25/14  MRSA PCR Screening     Status: None   Collection Time: 08/25/14  4:27 PM  Result Value Ref Range Status   MRSA by PCR NEGATIVE NEGATIVE Final    Comment:        The GeneXpert MRSA Assay (FDA approved for NASAL specimens only), is one component of a comprehensive MRSA colonization surveillance program. It is not intended to diagnose MRSA infection nor to guide or monitor treatment for MRSA infections.     RADIOLOGY:  No results found.  EKG:   Orders placed or performed during the hospital encounter of 08/25/14  . EKG 12-Lead  . EKG 12-Lead      Management plans discussed with the patient, family and they are in agreement.  CODE STATUS:     Code Status Orders        Start     Ordered   08/25/14 2334  Do not attempt resuscitation (DNR)   Continuous    Question Answer Comment  In the event of cardiac or respiratory ARREST Do not call a "code blue"   In the event of cardiac or respiratory ARREST Do not perform Intubation, CPR, defibrillation or ACLS   In the event of cardiac or respiratory ARREST Use medication by any route, position, wound care, and other measures to relive pain and suffering. May use oxygen, suction and manual treatment of airway obstruction as needed for comfort.      08/25/14 2333      TOTAL TIME TAKING CARE OF THIS PATIENT: 45 minutes.    Katharina Caper M.D on 08/30/2014 at 10:41 AM  Between 7am to 6pm -  Pager - [phone]  After 6pm go to [email or web address] - password [email or web address] [facility]  [facility]  Office  [phone]  CC: Primary care physician; Leim Fabry, MD

## 2014-10-14 ENCOUNTER — Ambulatory Visit: Payer: Medicare Other | Admitting: Oncology

## 2014-10-19 ENCOUNTER — Ambulatory Visit (INDEPENDENT_AMBULATORY_CARE_PROVIDER_SITE_OTHER): Payer: Medicare Other | Admitting: Cardiovascular Disease

## 2014-10-19 ENCOUNTER — Encounter: Payer: Self-pay | Admitting: Cardiovascular Disease

## 2014-10-19 ENCOUNTER — Ambulatory Visit: Payer: Medicare Other | Admitting: Cardiovascular Disease

## 2014-10-19 VITALS — BP 118/68 | HR 84 | Ht 74.0 in | Wt 233.8 lb

## 2014-10-19 DIAGNOSIS — R2681 Unsteadiness on feet: Secondary | ICD-10-CM | POA: Diagnosis not present

## 2014-10-19 DIAGNOSIS — Z95810 Presence of automatic (implantable) cardiac defibrillator: Secondary | ICD-10-CM

## 2014-10-19 DIAGNOSIS — E119 Type 2 diabetes mellitus without complications: Secondary | ICD-10-CM

## 2014-10-19 DIAGNOSIS — I482 Chronic atrial fibrillation, unspecified: Secondary | ICD-10-CM

## 2014-10-19 DIAGNOSIS — I420 Dilated cardiomyopathy: Secondary | ICD-10-CM | POA: Diagnosis not present

## 2014-10-19 DIAGNOSIS — I5042 Chronic combined systolic (congestive) and diastolic (congestive) heart failure: Secondary | ICD-10-CM | POA: Diagnosis not present

## 2014-10-19 NOTE — Assessment & Plan Note (Addendum)
We have encouraged continued careful diet management in an effort to lose weight. Unable to exercise very well given his gait instability.  working with PT

## 2014-10-19 NOTE — Assessment & Plan Note (Signed)
He appears relatively euvolemic on today's visit. No changes to his medications 

## 2014-10-19 NOTE — Assessment & Plan Note (Signed)
Followed by Dr. Klein, EP 

## 2014-10-19 NOTE — Patient Instructions (Addendum)
You are doing well. No medication changes were made.  Please take AM meds after breakfast. Continue iron pills  Please call us if you have new issues that need to be addressed before your next appt.  Your physician wants you to follow-up in: 6 months.  You will receive a reminder letter in the mail two months in advance. If you don't receive a letter, please call our office to schedule the follow-up appointment.

## 2014-10-19 NOTE — Progress Notes (Signed)
Patient ID: Nicolas Morris, male    DOB: [birth year], 80 y.o.   MRN: [id number]  HPI Comments: 81 year old gentleman with nonischemic cardiomyopathy, cardiac catheterization October 2010 showing no significant CAD, ejection fraction 45% at that time, with notes indicating ejection fraction less than 30% at [facility] at the end of 2013, atrial fibrillation/flutter, type 2 diabetes, previously seen at [facility], started on anticoagulation for atrial fibrillation several years ago, ICD placement December 2013, several cardiac catheterizations in 2014 one in January and one in May, by his report with several ICD shocks in October 2014,  admission to the hospital for bright red blood per rectum. Cardiology was consulted for management of his atrial fibrillation, anticoagulation, chest pain. Xarelto was held and recommendation was for him to hold this for at least 2 weeks.  He did have 3 units of packed red blood cells in the hospital for initial hemoglobin of 6.9. It was felt that he had demand ischemia in the setting of anemia with improvement after transfusion. Cardiac enzymes were negative during his hospital course.  He presents today for follow-up of his cardiomyopathy  In follow-up, he reports that he fell one month ago, hit his head. Hemoglobin 6.4 in July 2016, given blood to 8.4.. He's been taking iron daily, recent lab work 2 weeks ago shows hematocrit 39. Reports his balance is still poor. Family presents with him today.  He has been living at the [facility] since the hospital. Family reports he will likely need to stay there indefinitely given his high risk of falls and inability to care for himself. He is annoyed by glucose checks every day early in the morning  EKG on today's visit shows underlying atrial fibrillation, paced rhythm  Other past medical history Echocardiogram showed ejection fraction 45-50%. Inferior and posterior wall hypokinesis, ICD wire noted, mildly elevated right  ventricular systolic pressures, mild MR, mildly dilated left atrium  CT abdomen and pelvis showed bilateral pleural effusions right greater than left, hepatic Steve tightness with early cirrhosis with ascites, mild splenomegaly, iron levels were extremely low at 26, iron saturation 6  EGD was performed that showed small hiatal hernia, esophagitis, erosions/gastritis, gastric varices, diverticulosis on colonoscopy with some internal hemorrhoids. He was evaluated by Dr. Marva Panda and started on PPI twice a day      Allergies  Allergen Reactions  . No Known Allergies     Outpatient Encounter Prescriptions as of 10/19/2014  Medication Sig  . acetaminophen (TYLENOL) 500 MG tablet Take 500 mg by mouth 3 (three) times daily.  Marland Kitchen aspirin 81 MG chewable tablet Chew by mouth daily.  . carvedilol (COREG) 12.5 MG tablet Take 12.5 mg by mouth 2 (two) times daily with a meal.  . digoxin (LANOXIN) 0.25 MG tablet Take 0.25 mg by mouth daily.  . ferrous sulfate 325 (65 FE) MG tablet Take 1 tablet (325 mg total) by mouth 2 (two) times daily with a meal.  . finasteride (PROSCAR) 5 MG tablet Take 5 mg by mouth daily.  . furosemide (LASIX) 40 MG tablet Take 40 mg by mouth.  . gabapentin (NEURONTIN) 300 MG capsule Take 300 mg by mouth 3 (three) times daily.   Marland Kitchen lisinopril (PRINIVIL,ZESTRIL) 2.5 MG tablet Take 2.5 mg by mouth daily.  Marland Kitchen lovastatin (MEVACOR) 20 MG tablet Take 20 mg by mouth at bedtime.   . meclizine (ANTIVERT) 25 MG tablet Take 25 mg by mouth 2 (two) times daily.  . metFORMIN (GLUCOPHAGE) 500 MG tablet Take 1,000  mg by mouth 2 (two) times daily with a meal.   . pantoprazole (PROTONIX) 40 MG tablet Take 40 mg by mouth daily.   . polyethylene glycol (MIRALAX / GLYCOLAX) packet Take 17 g by mouth daily as needed for mild constipation or moderate constipation.  . potassium chloride (MICRO-K) 10 MEQ CR capsule Take 20 mEq by mouth daily.   . rivaroxaban (XARELTO) 20 MG TABS tablet Take 20 mg by mouth  daily with supper.  . sertraline (ZOLOFT) 100 MG tablet Take 100 mg by mouth daily.  . tamsulosin (FLOMAX) 0.4 MG CAPS capsule Take 0.4 mg by mouth.  . traZODone (DESYREL) 50 MG tablet Take 150 mg by mouth at bedtime.  . [DISCONTINUED] acetaminophen (TYLENOL) 325 MG tablet Take 650 mg by mouth every 4 (four) hours as needed.  . [DISCONTINUED] carvedilol (COREG) 6.25 MG tablet Take 2 tablets (12.5 mg total) by mouth 2 (two) times daily with a meal. (Patient not taking: Reported on 10/19/2014)  . [DISCONTINUED] furosemide (LASIX) 40 MG tablet Take 1 tablet (40 mg total) by mouth 2 (two) times daily. (Patient not taking: Reported on 10/19/2014)  . [DISCONTINUED] meclizine (ANTIVERT) 25 MG tablet Take 25 mg by mouth 3 (three) times daily as needed for dizziness.  . [DISCONTINUED] traZODone (DESYREL) 50 MG tablet Take 200 mg by mouth at bedtime.    No facility-administered encounter medications on file as of 10/19/2014.    Past Medical History  Diagnosis Date  . Non-obstructive CAD     a. s/p multiple caths @ [facility] - last reportedly 06/2012;  b. 06/2013 Lexi MV: EF 51%, no ischemia/infarct.  Marland Kitchen NICM (nonischemic cardiomyopathy)     a. EF reportedly < 30% 01/2013 ([facility]);  b. 01/2012 s/p MDT DTBB1D1 Viva S CRT-D;  c. 12/2012 Echo: EF 45-50%, inf/post HK, nl RV, mild MR.  . Diabetes mellitus without complication   . A-fib     a. Permanent, on Xarelto.  . H/O: GI bleed     a. 12/2012 EGD: HH, evidence of reflux. Erosive Gastritis. Nl duodenum;  b. 12/2012 Colonoscopy: Sev sigmoid and desc colon diverticulosis, transverse and ascending colon diverticulosis, nonbleeding internal hemorrhoids, no bleeding.  . Iron deficiency anemia   . Biventricular ICD (implantable cardioverter-defibrillator) -Medtronic 01/12/2013    a. 01/2012 [facility]:  01/2012 s/p MDT DTBB1D1 Viva S CRT-D.  Atrial port capped (afib).  . Diverticulosis     a. 12/2012 noted on colonoscopy.  . Hiatal hernia     a. 12/2012 noted on  EGD.  Marland Kitchen Gastritis     a. 12/2012 noted on EGD.  Marland Kitchen Hypertension   . AICD (automatic cardioverter/defibrillator) present   . Presence of permanent cardiac pacemaker     Past Surgical History  Procedure Laterality Date  . Cardiac catheterization  2013    [facility]   . Cardiac catheterization  2011  . Defib implant  01/27/2012    Serial # [id number] H Model # [id number]  . Pacemaker insertion  01/27/2012  . Colonoscopy  32014  . Upper gastrointestinal endoscopy  2014  . Fetal blood transfusion  2014  . Cardiac defibrillator placement    . Esophagogastroduodenoscopy (egd) with propofol N/A 08/29/2014    Procedure: ESOPHAGOGASTRODUODENOSCOPY (EGD) WITH PROPOFOL;  Surgeon: Elnita Maxwell, MD;  Location: [facility] ENDOSCOPY;  Service: Endoscopy;  Laterality: N/A;  . Colonoscopy with propofol N/A 08/29/2014    Procedure: COLONOSCOPY WITH PROPOFOL;  Surgeon: Elnita Maxwell, MD;  Location: [facility] ENDOSCOPY;  Service: Endoscopy;  Laterality:  N/A;    Social History  reports that he quit smoking about 11 years ago. His smoking use included Cigarettes. He has a 135 pack-year smoking history. He does not have any smokeless tobacco history on file. He reports that he does not drink alcohol or use illicit drugs.  Family History Family history is unknown by patient.  Review of Systems  Constitutional: Negative.   Eyes: Negative.   Respiratory: Negative.   Cardiovascular: Negative.   Gastrointestinal: Negative.   Musculoskeletal: Positive for back pain and gait problem.  Skin: Negative.   Allergic/Immunologic: Negative.   Neurological: Negative.   Hematological: Negative.   Psychiatric/Behavioral: Negative.   All other systems reviewed and are negative.   BP 118/68 mmHg  Pulse 84  Ht 6\' 2"  (1.88 m)  Wt 233 lb 12 oz (106.028 kg)  BMI 30.00 kg/m2  Physical Exam  Constitutional: He is oriented to person, place, and time. He appears well-developed and well-nourished.  Walks with a cane,  unsteady gait  HENT:  Head: Normocephalic.  Nose: Nose normal.  Mouth/Throat: Oropharynx is clear and moist.  Eyes: Conjunctivae are normal. Pupils are equal, round, and reactive to light.  Neck: Normal range of motion. Neck supple. No JVD present.  Cardiovascular: Normal rate, regular rhythm, S1 normal, S2 normal and intact distal pulses.  Exam reveals no gallop and no friction rub.   Murmur heard.  Systolic murmur is present with a grade of 2/6  Pulmonary/Chest: Effort normal and breath sounds normal. No respiratory distress. He has no wheezes. He has no rales. He exhibits no tenderness.  Abdominal: Soft. Bowel sounds are normal. He exhibits no distension. There is no tenderness.  Musculoskeletal: Normal range of motion. He exhibits no edema or tenderness.  Lymphadenopathy:    He has no cervical adenopathy.  Neurological: He is alert and oriented to person, place, and time. Coordination normal.  Skin: Skin is warm and dry. No rash noted. No erythema.  Psychiatric: He has a normal mood and affect. His behavior is normal. Judgment and thought content normal.      Assessment and Plan   Nursing note and vitals reviewed.

## 2014-10-19 NOTE — Assessment & Plan Note (Signed)
Recent fall, noted to be very anemic Balance issues discussed with patient's daughter She will call for additional falls

## 2014-10-19 NOTE — Assessment & Plan Note (Signed)
Heart rate relatively well-controlled. Paced rhythm. On anticoagulation though he does have falls and history of bleeding. Iron deficiency anemia, improved on supplemental iron No other changes to his medications at this time If he starts to have more falls, recommended the family call our office for further discussion on his anticoagulation

## 2014-10-21 ENCOUNTER — Inpatient Hospital Stay: Payer: Medicare Other | Attending: Oncology | Admitting: Oncology

## 2014-10-21 VITALS — BP 118/66 | HR 89 | Temp 97.7°F | Resp 16 | Wt 232.8 lb

## 2014-10-21 DIAGNOSIS — I255 Ischemic cardiomyopathy: Secondary | ICD-10-CM | POA: Diagnosis not present

## 2014-10-21 DIAGNOSIS — E119 Type 2 diabetes mellitus without complications: Secondary | ICD-10-CM | POA: Diagnosis not present

## 2014-10-21 DIAGNOSIS — Z95810 Presence of automatic (implantable) cardiac defibrillator: Secondary | ICD-10-CM | POA: Diagnosis not present

## 2014-10-21 DIAGNOSIS — F17210 Nicotine dependence, cigarettes, uncomplicated: Secondary | ICD-10-CM | POA: Insufficient documentation

## 2014-10-21 DIAGNOSIS — I4891 Unspecified atrial fibrillation: Secondary | ICD-10-CM | POA: Insufficient documentation

## 2014-10-21 DIAGNOSIS — I2510 Atherosclerotic heart disease of native coronary artery without angina pectoris: Secondary | ICD-10-CM | POA: Insufficient documentation

## 2014-10-21 DIAGNOSIS — Z79899 Other long term (current) drug therapy: Secondary | ICD-10-CM | POA: Diagnosis not present

## 2014-10-21 DIAGNOSIS — D509 Iron deficiency anemia, unspecified: Secondary | ICD-10-CM | POA: Insufficient documentation

## 2014-10-21 DIAGNOSIS — K449 Diaphragmatic hernia without obstruction or gangrene: Secondary | ICD-10-CM | POA: Diagnosis not present

## 2014-10-21 DIAGNOSIS — Z7982 Long term (current) use of aspirin: Secondary | ICD-10-CM | POA: Insufficient documentation

## 2014-10-21 DIAGNOSIS — K5790 Diverticulosis of intestine, part unspecified, without perforation or abscess without bleeding: Secondary | ICD-10-CM | POA: Diagnosis not present

## 2014-10-21 DIAGNOSIS — I10 Essential (primary) hypertension: Secondary | ICD-10-CM | POA: Insufficient documentation

## 2014-10-21 LAB — FOLATE: Folate: 12 ng/mL (ref >5.9)

## 2014-10-21 LAB — IRON AND TIBC
Iron: 47 ug/dL (ref 45–182)
Saturation Ratios: 11 % — ABNORMAL LOW (ref 17.9–39.5)
TIBC: 443 ug/dL (ref 250–450)
UIBC: 396 ug/dL

## 2014-10-21 LAB — FERRITIN: Ferritin: 21 ng/mL — ABNORMAL LOW (ref 24–336)

## 2014-10-21 LAB — CBC
HEMATOCRIT: 40.5 % (ref 40.0–52.0)
Hemoglobin: 13.1 g/dL (ref 13.0–18.0)
MCH: 23.8 pg — AB (ref 26.0–34.0)
MCHC: 32.3 g/dL (ref 32.0–36.0)
MCV: 73.8 fL — AB (ref 80.0–100.0)
Platelets: 227 10*3/uL (ref 150–440)
RBC: 5.49 MIL/uL (ref 4.40–5.90)
RDW: 27.2 % — AB (ref 11.5–14.5)
WBC: 7.8 10*3/uL (ref 3.8–10.6)

## 2014-10-21 LAB — LACTATE DEHYDROGENASE: LDH: 137 U/L (ref 98–192)

## 2014-10-21 LAB — DAT, POLYSPECIFIC AHG (ARMC ONLY): Polyspecific AHG test: NEGATIVE

## 2014-10-21 NOTE — Progress Notes (Signed)
Patient has history of anemia requiring blood transfusions.

## 2014-10-22 LAB — VITAMIN B12: VITAMIN B 12: 192 pg/mL (ref 180–914)

## 2014-10-22 LAB — HAPTOGLOBIN: HAPTOGLOBIN: 179 mg/dL (ref 34–200)

## 2014-10-24 DIAGNOSIS — D509 Iron deficiency anemia, unspecified: Secondary | ICD-10-CM | POA: Insufficient documentation

## 2014-10-24 NOTE — Progress Notes (Signed)
[facility]  Telephone:(336) [phone] Fax:(336) [phone]  ID: Nicolas Morris OB: 11-19-1933  MR#: [id number]  AOZ#:[id number]  Patient Care Team: Leim Fabry, MD as PCP - General (Family Medicine)  CHIEF COMPLAINT:  Chief Complaint  Patient presents with  . New Evaluation    IDA    INTERVAL HISTORY: Patient is a 81 year old male who was recently admitted to the hospital in July 2016 with significant GI bleed and iron deficiency anemia. Patient had EGD, capsule endoscopy, and colonoscopy did not reveal a definitive source of his bleeding. He currently feels well and is asymptomatic. He does not complain of weakness or fatigue. He denies any recent fevers. He has no neurologic complaints. He denies any chest pain or shortness of breath. He denies any nausea, vomiting, constipation, or diarrhea. He has no further melanoma or hematochezia. He has no urinary complaints. Patient offers no further specific complaints.  REVIEW OF SYSTEMS:   Review of Systems  Constitutional: Negative.   Respiratory: Negative.   Cardiovascular: Negative.   Gastrointestinal: Negative for blood in stool and melena.  Musculoskeletal: Negative.     As per HPI. Otherwise, a complete review of systems is negatve.  PAST MEDICAL HISTORY: Past Medical History  Diagnosis Date  . Non-obstructive CAD     a. s/p multiple caths @ [facility] - last reportedly 06/2012;  b. 06/2013 Lexi MV: EF 51%, no ischemia/infarct.  Marland Kitchen NICM (nonischemic cardiomyopathy)     a. EF reportedly < 30% 01/2013 ([facility]);  b. 01/2012 s/p MDT DTBB1D1 Viva S CRT-D;  c. 12/2012 Echo: EF 45-50%, inf/post HK, nl RV, mild MR.  . Diabetes mellitus without complication   . A-fib     a. Permanent, on Xarelto.  . H/O: GI bleed     a. 12/2012 EGD: HH, evidence of reflux. Erosive Gastritis. Nl duodenum;  b. 12/2012 Colonoscopy: Sev sigmoid and desc colon diverticulosis, transverse and ascending colon diverticulosis, nonbleeding  internal hemorrhoids, no bleeding.  . Iron deficiency anemia   . Biventricular ICD (implantable cardioverter-defibrillator) -Medtronic 01/12/2013    a. 01/2012 [facility]:  01/2012 s/p MDT DTBB1D1 Viva S CRT-D.  Atrial port capped (afib).  . Diverticulosis     a. 12/2012 noted on colonoscopy.  . Hiatal hernia     a. 12/2012 noted on EGD.  Marland Kitchen Gastritis     a. 12/2012 noted on EGD.  Marland Kitchen Hypertension   . AICD (automatic cardioverter/defibrillator) present   . Presence of permanent cardiac pacemaker     PAST SURGICAL HISTORY: Past Surgical History  Procedure Laterality Date  . Cardiac catheterization  2013    [facility]   . Cardiac catheterization  2011  . Defib implant  01/27/2012    Serial # [id number] H Model # [id number]  . Pacemaker insertion  01/27/2012  . Colonoscopy  32014  . Upper gastrointestinal endoscopy  2014  . Fetal blood transfusion  2014  . Cardiac defibrillator placement    . Esophagogastroduodenoscopy (egd) with propofol N/A 08/29/2014    Procedure: ESOPHAGOGASTRODUODENOSCOPY (EGD) WITH PROPOFOL;  Surgeon: Elnita Maxwell, MD;  Location: [facility] ENDOSCOPY;  Service: Endoscopy;  Laterality: N/A;  . Colonoscopy with propofol N/A 08/29/2014    Procedure: COLONOSCOPY WITH PROPOFOL;  Surgeon: Elnita Maxwell, MD;  Location[facility] END[facility]COPY;  Service: Endoscopy;  Laterality: N/A;    FAMILY HISTORY Family History  Problem Relation Age of Onset  . Family history unknown: Yes       ADVANCED DIRECTIVES:    HEALTH MAINTENANCE: Social History  Substance Use Topics  . Smoking status: Former Smoker -- 3.00 packs/day for 45 years    Types: Cigarettes    Quit date: 01/13/2003  . Smokeless tobacco: Not on file  . Alcohol Use: No     Colonoscopy:  PAP:  Bone density:  Lipid panel:  Allergies  Allergen Reactions  . No Known Allergies     Current Outpatient Prescriptions  Medication Sig Dispense Refill  . acetaminophen (TYLENOL) 500 MG tablet Take 500 mg by mouth 3  (three) times daily.    Marland Kitchen aspirin 81 MG chewable tablet Chew by mouth daily.    . carvedilol (COREG) 12.5 MG tablet Take 12.5 mg by mouth 2 (two) times daily with a meal.    . digoxin (LANOXIN) 0.25 MG tablet Take 0.25 mg by mouth daily.    . ferrous sulfate 325 (65 FE) MG tablet Take 1 tablet (325 mg total) by mouth 2 (two) times daily with a meal. 60 tablet 3  . finasteride (PROSCAR) 5 MG tablet Take 5 mg by mouth daily.    . furosemide (LASIX) 40 MG tablet Take 40 mg by mouth.    . gabapentin (NEURONTIN) 300 MG capsule Take 300 mg by mouth 3 (three) times daily.     Marland Kitchen lisinopril (PRINIVIL,ZESTRIL) 2.5 MG tablet Take 2.5 mg by mouth daily.    . meclizine (ANTIVERT) 25 MG tablet Take 25 mg by mouth 2 (two) times daily.    . metFORMIN (GLUCOPHAGE) 500 MG tablet Take 1,000 mg by mouth 2 (two) times daily with a meal.     . pantoprazole (PROTONIX) 40 MG tablet Take 40 mg by mouth daily.     . polyethylene glycol (MIRALAX / GLYCOLAX) packet Take 17 g by mouth daily as needed for mild constipation or moderate constipation.    . potassium chloride (MICRO-K) 10 MEQ CR capsule Take 20 mEq by mouth daily.     . rivaroxaban (XARELTO) 20 MG TABS tablet Take 20 mg by mouth daily with supper.    . sertraline (ZOLOFT) 100 MG tablet Take 100 mg by mouth daily.    . tamsulosin (FLOMAX) 0.4 MG CAPS capsule Take 0.4 mg by mouth.    . traZODone (DESYREL) 50 MG tablet Take 150 mg by mouth at bedtime.    . lovastatin (MEVACOR) 20 MG tablet Take 20 mg by mouth at bedtime.      No current facility-administered medications for this visit.    OBJECTIVE: Filed Vitals:   10/21/14 1232  BP: 118/66  Pulse: 89  Temp: 97.7 F (36.5 C)  Resp: 16     Body mass index is 29.88 kg/(m^2).    ECOG FS:0 - Asymptomatic  General: Well-developed, well-nourished, no acute distress. Eyes: Pink conjunctiva, anicteric sclera. HEENT: Normocephalic, moist mucous membranes, clear oropharnyx. Lungs: Clear to auscultation  bilaterally. Heart: Regular rate and rhythm. No rubs, murmurs, or gallops. Abdomen: Soft, nontender, nondistended. No organomegaly noted, normoactive bowel sounds. Musculoskeletal: No edema, cyanosis, or clubbing. Neuro: Alert, answering all questions appropriately. Cranial nerves grossly intact. Skin: No rashes or petechiae noted. Psych: Normal affect. Lymphatics: No cervical, calvicular, axillary or inguinal LAD.   LAB RESULTS:  Lab Results  Component Value Date   NA 135 08/27/2014   K 4.0 08/27/2014   CL 103 08/27/2014   CO2 26 08/27/2014   GLUCOSE 123* 08/27/2014   BUN 15 08/27/2014   CREATININE 0.94 08/27/2014   CALCIUM 8.5* 08/27/2014   PROT 7.5 06/26/2014   ALBUMIN 3.6 06/26/2014  AST 26 06/26/2014   ALT 14* 06/26/2014   ALKPHOS 73 06/26/2014   BILITOT 0.6 06/26/2014   GFRNONAA >60 08/27/2014   GFRAA >60 08/27/2014    Lab Results  Component Value Date   WBC 7.8 10/21/2014   NEUTROABS 5.0 06/26/2014   HGB 13.1 10/21/2014   HCT 40.5 10/21/2014   MCV 73.8* 10/21/2014   PLT 227 10/21/2014     STUDIES: No results found.  ASSESSMENT: Iron deficiency anemia.  PLAN:    1. Iron deficiency anemia: Patient's hemoglobin is now within normal limits. His iron stores continue to be mildly decreased. The remainder of his anemia workup is either negative or within normal limits. Patient will benefit from IV Feraheme in the future. No intervention is needed at this time. Return to clinic in 3 months with repeat laboratory work and further evaluation.  Patient expressed understanding and was in agreement with this plan. He also understands that He can call clinic at any time with any questions, concerns, or complaints.     Jeralyn Ruths, MD   10/24/2014 1:38 PM

## 2015-02-01 ENCOUNTER — Other Ambulatory Visit: Payer: Medicare Other

## 2015-02-03 ENCOUNTER — Ambulatory Visit: Payer: Medicare Other

## 2015-02-03 ENCOUNTER — Ambulatory Visit: Payer: Medicare Other | Admitting: Oncology

## 2015-02-17 ENCOUNTER — Ambulatory Visit: Payer: Medicare Other | Admitting: Oncology

## 2015-02-17 ENCOUNTER — Ambulatory Visit: Payer: Medicare Other

## 2015-02-17 ENCOUNTER — Other Ambulatory Visit: Payer: Medicare Other

## 2015-02-22 ENCOUNTER — Encounter: Payer: Self-pay | Admitting: *Deleted

## 2015-02-24 ENCOUNTER — Ambulatory Visit: Payer: Medicare Other

## 2015-02-24 ENCOUNTER — Other Ambulatory Visit: Payer: Medicare Other

## 2015-02-24 ENCOUNTER — Ambulatory Visit: Payer: Medicare Other | Admitting: Oncology

## 2015-03-03 ENCOUNTER — Inpatient Hospital Stay: Payer: Medicare Other | Admitting: Oncology

## 2015-03-03 ENCOUNTER — Inpatient Hospital Stay: Payer: Medicare Other

## 2015-03-10 ENCOUNTER — Inpatient Hospital Stay: Payer: Medicare Other | Attending: Oncology

## 2015-03-10 ENCOUNTER — Inpatient Hospital Stay (HOSPITAL_BASED_OUTPATIENT_CLINIC_OR_DEPARTMENT_OTHER): Payer: Medicare Other | Admitting: Oncology

## 2015-03-10 ENCOUNTER — Inpatient Hospital Stay: Payer: Medicare Other

## 2015-03-10 VITALS — BP 138/69 | HR 74 | Temp 96.7°F | Resp 16 | Wt 227.5 lb

## 2015-03-10 DIAGNOSIS — Z87891 Personal history of nicotine dependence: Secondary | ICD-10-CM | POA: Insufficient documentation

## 2015-03-10 DIAGNOSIS — D509 Iron deficiency anemia, unspecified: Secondary | ICD-10-CM

## 2015-03-10 DIAGNOSIS — I10 Essential (primary) hypertension: Secondary | ICD-10-CM | POA: Insufficient documentation

## 2015-03-10 DIAGNOSIS — Z862 Personal history of diseases of the blood and blood-forming organs and certain disorders involving the immune mechanism: Secondary | ICD-10-CM | POA: Diagnosis not present

## 2015-03-10 DIAGNOSIS — K5790 Diverticulosis of intestine, part unspecified, without perforation or abscess without bleeding: Secondary | ICD-10-CM | POA: Diagnosis not present

## 2015-03-10 DIAGNOSIS — Z7982 Long term (current) use of aspirin: Secondary | ICD-10-CM | POA: Diagnosis not present

## 2015-03-10 DIAGNOSIS — E119 Type 2 diabetes mellitus without complications: Secondary | ICD-10-CM | POA: Insufficient documentation

## 2015-03-10 DIAGNOSIS — I429 Cardiomyopathy, unspecified: Secondary | ICD-10-CM | POA: Diagnosis not present

## 2015-03-10 DIAGNOSIS — Z79899 Other long term (current) drug therapy: Secondary | ICD-10-CM | POA: Diagnosis not present

## 2015-03-10 DIAGNOSIS — I2510 Atherosclerotic heart disease of native coronary artery without angina pectoris: Secondary | ICD-10-CM

## 2015-03-10 DIAGNOSIS — Z7984 Long term (current) use of oral hypoglycemic drugs: Secondary | ICD-10-CM

## 2015-03-10 DIAGNOSIS — Z7901 Long term (current) use of anticoagulants: Secondary | ICD-10-CM | POA: Diagnosis not present

## 2015-03-10 DIAGNOSIS — K449 Diaphragmatic hernia without obstruction or gangrene: Secondary | ICD-10-CM | POA: Insufficient documentation

## 2015-03-10 DIAGNOSIS — Z950 Presence of cardiac pacemaker: Secondary | ICD-10-CM | POA: Insufficient documentation

## 2015-03-10 DIAGNOSIS — I4891 Unspecified atrial fibrillation: Secondary | ICD-10-CM | POA: Diagnosis not present

## 2015-03-10 LAB — CBC WITH DIFFERENTIAL/PLATELET
Basophils Absolute: 0.1 10*3/uL (ref 0–0.1)
Basophils Relative: 1 %
EOS ABS: 0.1 10*3/uL (ref 0–0.7)
Eosinophils Relative: 2 %
HCT: 46.2 % (ref 40.0–52.0)
HEMOGLOBIN: 14.9 g/dL (ref 13.0–18.0)
LYMPHS ABS: 1.5 10*3/uL (ref 1.0–3.6)
Lymphocytes Relative: 22 %
MCH: 26.3 pg (ref 26.0–34.0)
MCHC: 32.3 g/dL (ref 32.0–36.0)
MCV: 81.6 fL (ref 80.0–100.0)
MONOS PCT: 10 %
Monocytes Absolute: 0.6 10*3/uL (ref 0.2–1.0)
NEUTROS PCT: 65 %
Neutro Abs: 4.4 10*3/uL (ref 1.4–6.5)
Platelets: 197 10*3/uL (ref 150–440)
RBC: 5.66 MIL/uL (ref 4.40–5.90)
RDW: 15.7 % — ABNORMAL HIGH (ref 11.5–14.5)
WBC: 6.7 10*3/uL (ref 3.8–10.6)

## 2015-03-10 LAB — FERRITIN: FERRITIN: 21 ng/mL — AB (ref 24–336)

## 2015-03-10 LAB — IRON AND TIBC
IRON: 40 ug/dL — AB (ref 45–182)
Saturation Ratios: 10 % — ABNORMAL LOW (ref 17.9–39.5)
TIBC: 393 ug/dL (ref 250–450)
UIBC: 353 ug/dL

## 2015-03-10 NOTE — Progress Notes (Signed)
[facility]  Telephone:(336) [phone] Fax:(336) [phone]  ID: Nicolas Morris OB: 08-26-1933  MR#: [id number]  NGE#:[id number]  Patient Care Team: Leim Fabry, MD as PCP - General (Family Medicine)  CHIEF COMPLAINT:  Chief Complaint  Patient presents with  . Anemia    INTERVAL HISTORY: Patient returns to clinic today for labs and further evaluation for IV Feraheme.  He currently feels well and is asymptomatic. He does not complain of weakness or fatigue. He denies any recent fevers. He has no neurologic complaints. He denies any chest pain or shortness of breath. He denies any nausea, vomiting, constipation, or diarrhea. He has no further melanoma or hematochezia. He has no urinary complaints. Patient offers no further specific complaints.  REVIEW OF SYSTEMS:   Review of Systems  Constitutional: Negative.   Respiratory: Negative.   Cardiovascular: Negative.   Gastrointestinal: Negative for blood in stool and melena.  Musculoskeletal: Negative.     As per HPI. Otherwise, a complete review of systems is negatve.  PAST MEDICAL HISTORY: Past Medical History  Diagnosis Date  . Non-obstructive CAD     a. s/p multiple caths @ [facility] - last reportedly 06/2012;  b. 06/2013 Lexi MV: EF 51%, no ischemia/infarct.  Marland Kitchen NICM (nonischemic cardiomyopathy)     a. EF reportedly < 30% 01/2013 ([facility]);  b. 01/2012 s/p MDT DTBB1D1 Viva S CRT-D;  c. 12/2012 Echo: EF 45-50%, inf/post HK, nl RV, mild MR.  . Diabetes mellitus without complication   . A-fib     a. Permanent, on Xarelto.  . H/O: GI bleed     a. 12/2012 EGD: HH, evidence of reflux. Erosive Gastritis. Nl duodenum;  b. 12/2012 Colonoscopy: Sev sigmoid and desc colon diverticulosis, transverse and ascending colon diverticulosis, nonbleeding internal hemorrhoids, no bleeding.  . Iron deficiency anemia   . Biventricular ICD (implantable cardioverter-defibrillator) -Medtronic 01/12/2013    a. 01/2012 [facility]:  01/2012  s/p MDT DTBB1D1 Viva S CRT-D.  Atrial port capped (afib).  . Diverticulosis     a. 12/2012 noted on colonoscopy.  . Hiatal hernia     a. 12/2012 noted on EGD.  Marland Kitchen Gastritis     a. 12/2012 noted on EGD.  Marland Kitchen Hypertension   . AICD (automatic cardioverter/defibrillator) present   . Presence of permanent cardiac pacemaker     PAST SURGICAL HISTORY: Past Surgical History  Procedure Laterality Date  . Cardiac catheterization  2013    [facility]   . Cardiac catheterization  2011  . Defib implant  01/27/2012    Serial # [id number] H Model # [id number]  . Pacemaker insertion  01/27/2012  . Colonoscopy  32014  . Upper gastrointestinal endoscopy  2014  . Fetal blood transfusion  2014  . Cardiac defibrillator placement    . Esophagogastroduodenoscopy (egd) with propofol N/A 08/29/2014    Procedure: ESOPHAGOGASTRODUODENOSCOPY (EGD) WITH PROPOFOL;  Surgeon: Elnita Maxwell, MD;  Location: [facility] ENDOSCOPY;  Service: Endoscopy;  Laterality: N/A;  . Colonoscopy with propofol N/A 08/29/2014    Procedure: COLONOSCOPY WITH PROPOFOL;  Surgeon: Elnita Maxwell, MD;  Location: [facility] ENDOSCOPY;  Service: Endoscopy;  Laterality: N/A;    FAMILY HISTORY Family History  Problem Relation Age of Onset  . Family history unknown: Yes       ADVANCED DIRECTIVES:    HEALTH MAINTENANCE: Social History  Substance Use Topics  . Smoking status: Former Smoker -- 3.00 packs/day for 45 years    Types: Cigarettes    Quit date: 01/13/2003  . Smokeless  tobacco: Not on file  . Alcohol Use: No     Colonoscopy:  PAP:  Bone density:  Lipid panel:  Allergies  Allergen Reactions  . No Known Allergies     Current Outpatient Prescriptions  Medication Sig Dispense Refill  . acetaminophen (TYLENOL) 500 MG tablet Take 500 mg by mouth 3 (three) times daily.    Marland Kitchen aspirin 81 MG chewable tablet Chew by mouth daily.    Marland Kitchen buPROPion (WELLBUTRIN XL) 150 MG 24 hr tablet Take by mouth.    . carvedilol (COREG) 12.5 MG tablet  Take 12.5 mg by mouth 2 (two) times daily with a meal.    . digoxin (LANOXIN) 0.25 MG tablet Take 0.25 mg by mouth daily.    . ferrous sulfate 325 (65 FE) MG tablet Take 1 tablet (325 mg total) by mouth 2 (two) times daily with a meal. 60 tablet 3  . finasteride (PROSCAR) 5 MG tablet Take 5 mg by mouth daily.    . furosemide (LASIX) 40 MG tablet Take 40 mg by mouth.    . gabapentin (NEURONTIN) 300 MG capsule Take 300 mg by mouth 3 (three) times daily.     Marland Kitchen lisinopril (PRINIVIL,ZESTRIL) 2.5 MG tablet Take 2.5 mg by mouth daily.    . meclizine (ANTIVERT) 25 MG tablet Take 25 mg by mouth 2 (two) times daily.    . metFORMIN (GLUCOPHAGE) 500 MG tablet Take 1,000 mg by mouth 2 (two) times daily with a meal.     . pantoprazole (PROTONIX) 40 MG tablet Take 40 mg by mouth daily.     . polyethylene glycol (MIRALAX / GLYCOLAX) packet Take 17 g by mouth daily as needed for mild constipation or moderate constipation.    . potassium chloride SA (K-DUR,KLOR-CON) 20 MEQ tablet     . rivaroxaban (XARELTO) 20 MG TABS tablet Take 20 mg by mouth daily with supper.    . sertraline (ZOLOFT) 100 MG tablet Take 100 mg by mouth daily.    . tamsulosin (FLOMAX) 0.4 MG CAPS capsule Take 0.4 mg by mouth.    . traZODone (DESYREL) 50 MG tablet Take 150 mg by mouth at bedtime.    . lovastatin (MEVACOR) 20 MG tablet Take 20 mg by mouth at bedtime.      No current facility-administered medications for this visit.    OBJECTIVE: Filed Vitals:   03/10/15 1056  BP: 138/69  Pulse: 74  Temp: 96.7 F (35.9 C)  Resp: 16     Body mass index is 29.2 kg/(m^2).    ECOG FS:0 - Asymptomatic  General: Well-developed, well-nourished, no acute distress. Eyes: Pink conjunctiva, anicteric sclera. Lungs: Clear to auscultation bilaterally. Heart: Regular rate and rhythm. No rubs, murmurs, or gallops. Abdomen: Soft, nontender, nondistended. No organomegaly noted, normoactive bowel sounds. Musculoskeletal: No edema, cyanosis, or  clubbing. Neuro: Alert, answering all questions appropriately. Cranial nerves grossly intact. Skin: No rashes or petechiae noted. Psych: Normal affect.   LAB RESULTS:  Lab Results  Component Value Date   NA 135 08/27/2014   K 4.0 08/27/2014   CL 103 08/27/2014   CO2 26 08/27/2014   GLUCOSE 123* 08/27/2014   BUN 15 08/27/2014   CREATININE 0.94 08/27/2014   CALCIUM 8.5* 08/27/2014   PROT 7.5 06/26/2014   ALBUMIN 3.6 06/26/2014   AST 26 06/26/2014   ALT 14* 06/26/2014   ALKPHOS 73 06/26/2014   BILITOT 0.6 06/26/2014   GFRNONAA >60 08/27/2014   GFRAA >60 08/27/2014    Lab Results  Component Value Date   WBC 6.7 03/10/2015   NEUTROABS 4.4 03/10/2015   HGB 14.9 03/10/2015   HCT 46.2 03/10/2015   MCV 81.6 03/10/2015   PLT 197 03/10/2015     STUDIES: No results found.  ASSESSMENT: Iron deficiency anemia.  PLAN:    1. Iron deficiency anemia: Patient's hemoglobin is within normal limits, up from 13.1 to 14.9 today. His iron stores are still pending from today. The remainder of his anemia workup is either negative or within normal limits. No intervention is needed at this time. He has been taking PO iron bid since November and tolerating it well. He has been instructed to continue his oral iron supplementation. Recommend monitoring his hemoglobin 1 or 2 times per year. No future appointment needed at this clinic at this time.   Patient expressed understanding and was in agreement with this plan. He also understands that He can call clinic at any time with any questions, concerns, or complaints.     Genevie Cheshire, NP   03/10/2015 11:47 AM   Patient was seen and evaluated independently and I agree with the assessment and plan as dictated above.  Jeralyn Ruths, MD 03/10/2015 12:44 PM

## 2015-04-11 ENCOUNTER — Emergency Department: Payer: Medicare Other

## 2015-04-11 ENCOUNTER — Emergency Department
Admission: EM | Admit: 2015-04-11 | Discharge: 2015-04-11 | Disposition: A | Discharge status: Home / Self Care | Payer: Medicare Other | Attending: Emergency Medicine | Admitting: Emergency Medicine

## 2015-04-11 ENCOUNTER — Encounter: Payer: Self-pay | Admitting: Emergency Medicine

## 2015-04-11 DIAGNOSIS — W01198A Fall on same level from slipping, tripping and stumbling with subsequent striking against other object, initial encounter: Secondary | ICD-10-CM | POA: Insufficient documentation

## 2015-04-11 DIAGNOSIS — W19XXXA Unspecified fall, initial encounter: Secondary | ICD-10-CM

## 2015-04-11 DIAGNOSIS — Y998 Other external cause status: Secondary | ICD-10-CM | POA: Insufficient documentation

## 2015-04-11 DIAGNOSIS — Y92002 Bathroom of unspecified non-institutional (private) residence single-family (private) house as the place of occurrence of the external cause: Secondary | ICD-10-CM | POA: Diagnosis not present

## 2015-04-11 DIAGNOSIS — Y9389 Activity, other specified: Secondary | ICD-10-CM | POA: Diagnosis not present

## 2015-04-11 DIAGNOSIS — R42 Dizziness and giddiness: Secondary | ICD-10-CM | POA: Diagnosis present

## 2015-04-11 DIAGNOSIS — N390 Urinary tract infection, site not specified: Secondary | ICD-10-CM | POA: Diagnosis not present

## 2015-04-11 DIAGNOSIS — Z87891 Personal history of nicotine dependence: Secondary | ICD-10-CM | POA: Insufficient documentation

## 2015-04-11 DIAGNOSIS — Z79899 Other long term (current) drug therapy: Secondary | ICD-10-CM | POA: Diagnosis not present

## 2015-04-11 DIAGNOSIS — Z7982 Long term (current) use of aspirin: Secondary | ICD-10-CM | POA: Diagnosis not present

## 2015-04-11 DIAGNOSIS — S2231XA Fracture of one rib, right side, initial encounter for closed fracture: Secondary | ICD-10-CM | POA: Diagnosis not present

## 2015-04-11 DIAGNOSIS — I10 Essential (primary) hypertension: Secondary | ICD-10-CM | POA: Diagnosis not present

## 2015-04-11 DIAGNOSIS — K862 Cyst of pancreas: Secondary | ICD-10-CM

## 2015-04-11 DIAGNOSIS — Z7984 Long term (current) use of oral hypoglycemic drugs: Secondary | ICD-10-CM | POA: Diagnosis not present

## 2015-04-11 DIAGNOSIS — R778 Other specified abnormalities of plasma proteins: Secondary | ICD-10-CM

## 2015-04-11 DIAGNOSIS — E119 Type 2 diabetes mellitus without complications: Secondary | ICD-10-CM | POA: Insufficient documentation

## 2015-04-11 DIAGNOSIS — R7989 Other specified abnormal findings of blood chemistry: Secondary | ICD-10-CM | POA: Diagnosis not present

## 2015-04-11 LAB — URINALYSIS COMPLETE WITH MICROSCOPIC (ARMC ONLY)
BACTERIA UA: NONE SEEN
BILIRUBIN URINE: NEGATIVE
Glucose, UA: NEGATIVE mg/dL
HGB URINE DIPSTICK: NEGATIVE
Ketones, ur: NEGATIVE mg/dL
Nitrite: NEGATIVE
PH: 6 (ref 5.0–8.0)
Protein, ur: NEGATIVE mg/dL
Specific Gravity, Urine: 1.021 (ref 1.005–1.030)

## 2015-04-11 LAB — COMPREHENSIVE METABOLIC PANEL
ALBUMIN: 4 g/dL (ref 3.5–5.0)
ALT: 12 U/L — ABNORMAL LOW (ref 17–63)
AST: 19 U/L (ref 15–41)
Alkaline Phosphatase: 55 U/L (ref 38–126)
Anion gap: 10 (ref 5–15)
BUN: 17 mg/dL (ref 6–20)
CHLORIDE: 103 mmol/L (ref 101–111)
CO2: 26 mmol/L (ref 22–32)
Calcium: 9.4 mg/dL (ref 8.9–10.3)
Creatinine, Ser: 0.88 mg/dL (ref 0.61–1.24)
GFR calc Af Amer: >60 mL/min (ref >60)
GFR calc non Af Amer: >60 mL/min (ref >60)
GLUCOSE: 114 mg/dL — AB (ref 65–99)
POTASSIUM: 4.4 mmol/L (ref 3.5–5.1)
SODIUM: 139 mmol/L (ref 135–145)
Total Bilirubin: 0.8 mg/dL (ref 0.3–1.2)
Total Protein: 7.5 g/dL (ref 6.5–8.1)

## 2015-04-11 LAB — CBC
HCT: 42.3 % (ref 40.0–52.0)
Hemoglobin: 13.7 g/dL (ref 13.0–18.0)
MCH: 25.7 pg — ABNORMAL LOW (ref 26.0–34.0)
MCHC: 32.3 g/dL (ref 32.0–36.0)
MCV: 79.6 fL — ABNORMAL LOW (ref 80.0–100.0)
PLATELETS: 212 10*3/uL (ref 150–440)
RBC: 5.31 MIL/uL (ref 4.40–5.90)
RDW: 15.8 % — AB (ref 11.5–14.5)
WBC: 10.2 10*3/uL (ref 3.8–10.6)

## 2015-04-11 LAB — TROPONIN I
TROPONIN I: 0.07 ng/mL — AB (ref <0.031)
TROPONIN I: 0.04 ng/mL — AB (ref <0.031)

## 2015-04-11 LAB — GLUCOSE, CAPILLARY: GLUCOSE-CAPILLARY: 106 mg/dL — AB (ref 65–99)

## 2015-04-11 LAB — DIGOXIN LEVEL: Digoxin Level: 1 ng/mL (ref 0.8–2.0)

## 2015-04-11 MED ORDER — IOHEXOL 300 MG/ML  SOLN
100.0000 mL | Freq: Once | INTRAMUSCULAR | Status: AC | PRN
  Administered 2015-04-11: 100 mL via INTRAVENOUS

## 2015-04-11 MED ORDER — SODIUM CHLORIDE 0.9 % IV BOLUS (SEPSIS)
500.0000 mL | Freq: Once | INTRAVENOUS | Status: AC
  Administered 2015-04-11: 500 mL via INTRAVENOUS

## 2015-04-11 MED ORDER — CEFTRIAXONE SODIUM 1 G IJ SOLR
1.0000 g | Freq: Once | INTRAMUSCULAR | Status: DC
  Filled 2015-04-11: qty 10

## 2015-04-11 MED ORDER — SODIUM CHLORIDE 0.9 % IV BOLUS (SEPSIS)
500.0000 mL | Freq: Once | INTRAVENOUS | Status: AC
Start: 2015-04-11 — End: 2015-04-11
  Administered 2015-04-11: 500 mL via INTRAVENOUS

## 2015-04-11 MED ORDER — CEPHALEXIN 500 MG PO CAPS: 500.0000 mg | ORAL_CAPSULE | Freq: Three times a day (TID) | ORAL | Status: AC

## 2015-04-11 MED ORDER — TRAMADOL HCL 50 MG PO TABS: 50.0000 mg | ORAL_TABLET | Freq: Four times a day (QID) | ORAL | Status: DC | PRN

## 2015-04-11 MED ORDER — ASPIRIN 81 MG PO CHEW
324.0000 mg | CHEWABLE_TABLET | Freq: Once | ORAL | Status: AC
  Administered 2015-04-11: 324 mg via ORAL
  Filled 2015-04-11: qty 4

## 2015-04-11 MED ORDER — IOHEXOL 240 MG/ML SOLN
25.0000 mL | Freq: Once | INTRAMUSCULAR | Status: AC | PRN
  Administered 2015-04-11: 25 mL via ORAL

## 2015-04-11 NOTE — ED Notes (Signed)
Med administered as ordered  - pt using the telephone to call his spouse  Privacy given

## 2015-04-11 NOTE — ED Provider Notes (Signed)
[facility] Emergency Department Provider Note  ____________________________________________  Time seen: Approximately 306 AM  I have reviewed the triage vital signs and the nursing notes.   HISTORY  Chief Complaint Dizziness    HPI Nicolas Morris is a 82 y.o. male who comes into the hospital complaining of some right-sided chest pain. The patient reports he slipped and fell and hit his right side. He reports he could not get out of bed and he's hurting on his right side with a bruise. According to the nurse and the EMS the patient initially told him that he was dizzy and fell yesterday during the day. He reports at that time he had the back of his head. He reports then that he was on the toilet and he fell and that when he hit his side tonight. The patient also fell trying to get into bed. He initially told the nurse that he thought it was 2015. He thinks he fell due to dizziness but reports that he does use a wheelchair at home. The patient denies any shortness of breath denies any chest pain and denies any nausea or vomiting. He reports that he has some difficulty with short-term memory.The patient's pain is a 6 out of 10 in intensity.   Past Medical History  Diagnosis Date  . Non-obstructive CAD     a. s/p multiple caths @ [facility] - last reportedly 06/2012;  b. 06/2013 Lexi MV: EF 51%, no ischemia/infarct.  Marland Kitchen NICM (nonischemic cardiomyopathy) (HCC)     a. EF reportedly < 30% 01/2013 ([facility]);  b. 01/2012 s/p MDT DTBB1D1 Viva S CRT-D;  c. 12/2012 Echo: EF 45-50%, inf/post HK, nl RV, mild MR.  . Diabetes mellitus without complication (HCC)   . A-fib (HCC)     a. Permanent, on Xarelto.  . H/O: GI bleed     a. 12/2012 EGD: HH, evidence of reflux. Erosive Gastritis. Nl duodenum;  b. 12/2012 Colonoscopy: Sev sigmoid and desc colon diverticulosis, transverse and ascending colon diverticulosis, nonbleeding internal hemorrhoids, no bleeding.  . Iron deficiency anemia    . Biventricular ICD (implantable cardioverter-defibrillator) -Medtronic 01/12/2013    a. 01/2012 [facility]:  01/2012 s/p MDT DTBB1D1 Viva S CRT-D.  Atrial port capped (afib).  . Diverticulosis     a. 12/2012 noted on colonoscopy.  . Hiatal hernia     a. 12/2012 noted on EGD.  Marland Kitchen Gastritis     a. 12/2012 noted on EGD.  Marland Kitchen Hypertension   . AICD (automatic cardioverter/defibrillator) present   . Presence of permanent cardiac pacemaker     Patient Active Problem List   Diagnosis Date Noted  . Iron deficiency anemia 10/24/2014  . GIB (gastrointestinal bleeding) 08/30/2014  . Hyponatremia 08/30/2014  . Elevated troponin 08/30/2014  . External hemorrhoids 08/30/2014  . Diverticulosis of colon without hemorrhage 08/30/2014  . Gastritis and gastroduodenitis 08/30/2014  . Acute posthemorrhagic anemia 08/25/2014  . Unsteady gait 03/04/2014  . NICM (nonischemic cardiomyopathy) (HCC) 07/20/2013  . Diabetes mellitus without complication (HCC)   . Diverticulosis   . Hiatal hernia   . Non-obstructive CAD   . Neck pain 04/21/2013  . Depression 01/26/2013  . Atrial fibrillation-permanent 01/12/2013  . Biventricular ICD (implantable cardioverter-defibrillator) -Medtronic 01/12/2013  . Congestive dilated cardiomyopathy (HCC) 01/12/2013  . GI bleed 01/12/2013  . Chronic combined systolic and diastolic CHF (congestive heart failure) (HCC) 01/12/2013  . Biventricular ICD (implantable cardioverter-defibrillator) -Medtronic 01/12/2013    Past Surgical History  Procedure Laterality Date  .  Cardiac catheterization  2013    [facility]   . Cardiac catheterization  2011  . Defib implant  01/27/2012    Serial # [id number] H Model # [id number]  . Pacemaker insertion  01/27/2012  . Colonoscopy  32014  . Upper gastrointestinal endoscopy  2014  . Fetal blood transfusion  2014  . Cardiac defibrillator placement    . Esophagogastroduodenoscopy (egd) with propofol N/A 08/29/2014    Procedure:  ESOPHAGOGASTRODUODENOSCOPY (EGD) WITH PROPOFOL;  Surgeon: Elnita Maxwell, MD;  Location: [facility] ENDOSCOPY;  Service: Endoscopy;  Laterality: N/A;  . Colonoscopy with propofol N/A 08/29/2014    Procedure: COLONOSCOPY WITH PROPOFOL;  Surgeon: Elnita Maxwell, MD;  Location: [facility] ENDOSCOPY;  Service: Endoscopy;  Laterality: N/A;    Current Outpatient Rx  Name  Route  Sig  Dispense  Refill  . acetaminophen (TYLENOL) 500 MG tablet   Oral   Take 500 mg by mouth 3 (three) times daily.         Marland Kitchen aspirin 81 MG chewable tablet   Oral   Chew by mouth daily.         Marland Kitchen buPROPion (WELLBUTRIN XL) 150 MG 24 hr tablet   Oral   Take by mouth.         . carvedilol (COREG) 12.5 MG tablet   Oral   Take 12.5 mg by mouth 2 (two) times daily with a meal.         . digoxin (LANOXIN) 0.25 MG tablet   Oral   Take 0.25 mg by mouth daily.         . ferrous sulfate 325 (65 FE) MG tablet   Oral   Take 1 tablet (325 mg total) by mouth 2 (two) times daily with a meal.   60 tablet   3   . finasteride (PROSCAR) 5 MG tablet   Oral   Take 5 mg by mouth daily.         . furosemide (LASIX) 40 MG tablet   Oral   Take 40 mg by mouth.         . gabapentin (NEURONTIN) 300 MG capsule   Oral   Take 300 mg by mouth 3 (three) times daily.          Marland Kitchen lisinopril (PRINIVIL,ZESTRIL) 2.5 MG tablet   Oral   Take 2.5 mg by mouth daily.         Marland Kitchen EXPIRED: lovastatin (MEVACOR) 20 MG tablet   Oral   Take 20 mg by mouth at bedtime.          . meclizine (ANTIVERT) 25 MG tablet   Oral   Take 25 mg by mouth 2 (two) times daily.         . metFORMIN (GLUCOPHAGE) 500 MG tablet   Oral   Take 1,000 mg by mouth 2 (two) times daily with a meal.          . pantoprazole (PROTONIX) 40 MG tablet   Oral   Take 40 mg by mouth daily.          . polyethylene glycol (MIRALAX / GLYCOLAX) packet   Oral   Take 17 g by mouth daily as needed for mild constipation or moderate constipation.          . potassium chloride SA (K-DUR,KLOR-CON) 20 MEQ tablet               . rivaroxaban (XARELTO) 20 MG TABS tablet   Oral   Take 20 mg  by mouth daily with supper.         . sertraline (ZOLOFT) 100 MG tablet   Oral   Take 100 mg by mouth daily.         . tamsulosin (FLOMAX) 0.4 MG CAPS capsule   Oral   Take 0.4 mg by mouth.         . traZODone (DESYREL) 50 MG tablet   Oral   Take 150 mg by mouth at bedtime.           Allergies No known allergies  Family History  Problem Relation Age of Onset  . Family history unknown: Yes    Social History Social History  Substance Use Topics  . Smoking status: Former Smoker -- 3.00 packs/day for 45 years    Types: Cigarettes    Quit date: 01/13/2003  . Smokeless tobacco: None  . Alcohol Use: No    Review of Systems Constitutional: No fever/chills Eyes: No visual changes. ENT: No sore throat. Cardiovascular: Denies chest pain. Respiratory: Denies shortness of breath. Gastrointestinal: No abdominal pain.  No nausea, no vomiting.  No diarrhea.  No constipation. Genitourinary: Negative for dysuria. Musculoskeletal: Negative for back pain. Skin: Negative for rash. Neurological: Dizziness and falls  10-point ROS otherwise negative.  ____________________________________________   PHYSICAL EXAM:  VITAL SIGNS: ED Triage Vitals  Enc Vitals Group     BP 04/11/15 0259 141/57 mmHg     Pulse Rate 04/11/15 0259 80     Resp 04/11/15 0259 18     Temp 04/11/15 0259 98.1 F (36.7 C)     Temp Source 04/11/15 0259 Oral     SpO2 04/11/15 0253 93 %     Weight 04/11/15 0259 233 lb (105.688 kg)     Height 04/11/15 0259  (1.88 m)     Head Cir --      Peak Flow --      Pain Score 04/11/15 0507 6     Pain Loc --      Pain Edu? --      Excl. in GC? --     Constitutional: Alert and oriented. Well appearing and in mild distress. Eyes: Conjunctivae are normal. PERRL. EOMI. Head: Atraumatic. Nose: No  congestion/rhinnorhea. Mouth/Throat: Mucous membranes are moist.  Oropharynx non-erythematous. Cardiovascular: Normal rate, regular rhythm. Grossly normal heart sounds.  Good peripheral circulation. Respiratory: Normal respiratory effort.  No retractions. Lungs CTAB. Gastrointestinal: Soft and nontender. No distention. Positive bowel sounds Musculoskeletal: No lower extremity tenderness nor edema.   Neurologic:  Normal speech and language. Cranial nerves II through XII are grossly intact Skin:  Skin is warm, dry and intact. Psychiatric: Mood and affect are normal.   ____________________________________________   LABS (all labs ordered are listed, but only abnormal results are displayed)  Labs Reviewed  GLUCOSE, CAPILLARY - Abnormal; Notable for the following:    Glucose-Capillary 106 (*)    All other components within normal limits  CBC - Abnormal; Notable for the following:    MCV 79.6 (*)    MCH 25.7 (*)    RDW 15.8 (*)    All other components within normal limits  COMPREHENSIVE METABOLIC PANEL - Abnormal; Notable for the following:    Glucose, Bld 114 (*)    ALT 12 (*)    All other components within normal limits  TROPONIN I - Abnormal; Notable for the following:    Troponin I 0.07 (*)    All other components within normal limits  URINALYSIS COMPLETEWITH  MICROSCOPIC ([facility] ONLY) - Abnormal; Notable for the following:    Color, Urine YELLOW (*)    APPearance CLEAR (*)    Leukocytes, UA 1+ (*)    Squamous Epithelial / LPF 0-5 (*)    All other components within normal limits  TROPONIN I - Abnormal; Notable for the following:    Troponin I 0.04 (*)    All other components within normal limits  URINE CULTURE  DIGOXIN LEVEL   ____________________________________________  EKG  ED ECG REPORT I, Rebecka Apley, the attending physician, personally viewed and interpreted this ECG.   Date: 04/11/2015  EKG Time: 256  Rate: 75  Rhythm: normal sinus rhythm, RBBB, ST  elevation in II, III and avf  Axis: right axis deviation  Intervals:right bundle branch block  ST&T Change: ST elevation in II, III, avf, flipped t in avl, V2  ED ECG REPORT #2 I, Rebecka Apley, the attending physician, personally viewed and interpreted this ECG.   Date: 04/11/2015  EKG Time: 503  Rate: 77  Rhythm: normal sinus rhythm, RBBB, ST elevation in II, III, avf  Axis: right axis deviation  Intervals:right bundle branch block  ST&T Change: ST elevation in II, III, avf, flipped t in avl, V2   ____________________________________________  RADIOLOGY  CT head and cervical spine: No evidence of traumatic intracranial injury or fracture, no evidence of fracture or subluxation along the cervical spine, mild cortical volume loss and scattered small vessel ischemic microangiopathy, small chronic lacunar infarcts at the basal ganglia bilaterally, mild degenerative change along the cervical spine  Chest x-ray: No acute cardiopulmonary process seen, suggestion of minimally displaced fracture of the right anterior lateral fifth rib. ____________________________________________   PROCEDURES  Procedure(s) performed: None  Critical Care performed: No  ____________________________________________   INITIAL IMPRESSION / ASSESSMENT AND PLAN / ED COURSE  Pertinent labs & imaging results that were available during my care of the patient were reviewed by me and considered in my medical decision making (see chart for details).  This is a 82 year old male who comes in to the hospital today with some right-sided rib pain after a fall today. The patient reports that he did feel dizzy earlier today but he had fallen multiple times. The patient was here for evaluation. The patient does have some what appears to be ST segment elevation in his leads 2,3 and aVF but he denies chest pain or shortness of breath.  Patient received some aspirin. The patient does have an elevated troponins I  will attempt to have him transferred to [facility] for further evaluation of his symptoms and his elevated troponin and EKG changes.  I contacted the [facility] group physician as [facility] did not have any beds available. They report that since the patient does not have any chest pain that they would not consider a STEMI. They did report to repeat the patient's troponin and reassess him.  I did attempt to admit the patient to the hospitalist service but they did request to wait for the results of the troponin and then in short the patient is able to ambulate. The patient's care was signed out to Dr. Silverio Lay who will follow-up the results of the troponin and disposition the patient. ____________________________________________   FINAL CLINICAL IMPRESSION(S) / ED DIAGNOSES  Final diagnoses:  Fall, initial encounter  Rib fracture, right, closed, initial encounter      Rebecka Apley, MD 04/11/15 1005

## 2015-04-11 NOTE — ED Provider Notes (Signed)
  Physical Exam  BP 145/73 mmHg  Pulse 78  Temp(Src) 98.1 F (36.7 C) (Oral)  Resp 21  Ht 6\' 2"  (1.88 m)  Wt 233 lb (105.688 kg)  BMI 29.90 kg/m2  SpO2 95%  Physical Exam  ED Course  Procedures  MDM Care assumed at sign out. Patient had a fall yesterday. CT head nl. States that his leg gave out but he didn't pass out. He has right sided rib pain and xray showed possible R 5th rib fracture. Initial EKG showed possible STEMI but Dr. Zenda Alpers called [facility] health cardiology and discussed case, who felt it is unchanged. She also discussed with hospitalist, who wants to decide after second troponin. Second troponin 0.04, down from 0.07. Patient had some R sided rib and R sided abdominal tenderness. CT showed R ninth and tenth rib fractures with no complications. 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 Not orthostatic. Ambulated well. Pacemaker interrogated and had no events. Considered MRI brain to r/o posterior stroke but patient doesn't feel dizzy anymore and MRI can't be performed here since he has a pacemaker. He is steady in his gait and had mechanical fall yesterday and has no neuro deficits. UA + UTI so was given ceftriaxone and will dc with keflex. Afebrile, WBC nl. Not septic. Will dc home.       Richardean Canal, MD 04/11/15 1218

## 2015-04-11 NOTE — Discharge Instructions (Signed)
You have some rib fractures on the right. Take tylenol for pain and take tramadol for severe pain.   Stay hydrated.   You have urinary tract infection. Take keflex three times daily for a week.   You have a cyst on your pancreas that needs follow up with your doctor.   Continue your current meds.   See your doctor this week.   Return to ER if you have severe pain, vomiting, fevers, passing out, chest pain.

## 2015-04-11 NOTE — ED Notes (Signed)
Pt presents to ED from home via [facility] c/o right rib pain. Per EMS pt woke up to use the bathroom, got dizzy and fell trying to get back into bed. Per patient, he fell in the bathroom and hit commode on right side. Pt reports felt dizzy and fell Monday and hit back of head, denies LOC. Pt uses walker but was not ambulating with walker at time of fall. Pt alert and oriented to situation and place. Bruising noted to right side of ribs. Pt respirations even and unlabored, skin warm and dry.

## 2015-04-13 LAB — URINE CULTURE: Culture: >=100000

## 2015-04-14 NOTE — Progress Notes (Addendum)
ED CULTURE RESULTS  Pharmacy Consult for MRSA positive urine cultures  Allergies  Allergen Reactions  . No Known Allergies    Patient Measurements: Height: 6\' 2"  (188 cm) Weight: 233 lb (105.688 kg) IBW/kg (Calculated) : 82.2  Microbiology: Recent Results (from the past 720 hour(s))  Urine culture     Status: None   Collection Time: 04/11/15  5:07 AM  Result Value Ref Range Status   Specimen Description URINE, RANDOM  Final   Special Requests NONE  Final   Culture   Final    >=100,000 COLONIES/mL METHICILLIN RESISTANT STAPHYLOCOCCUS AUREUS CEFOXITIN SCREEN - This test may be used to predict mecA-mediated oxacillin resistance, and it is based on the cefoxitin disk screen test.  The cefoxitin screen and oxacillin work in combination to determine the final interpretation reported for  oxacillin.      Report Status 04/13/2015 FINAL  Final   Organism ID, Bacteria METHICILLIN RESISTANT STAPHYLOCOCCUS AUREUS  Final      Susceptibility   Methicillin resistant staphylococcus aureus - MIC*    CIPROFLOXACIN >=8 RESISTANT Resistant     ERYTHROMYCIN >=8 RESISTANT Resistant     GENTAMICIN <=0.5 SENSITIVE Sensitive     NITROFURANTOIN <=16 SENSITIVE Sensitive     OXACILLIN >=4 RESISTANT Resistant     TETRACYCLINE <=1 SENSITIVE Sensitive     VANCOMYCIN <=0.5 SENSITIVE Sensitive     TRIMETH/SULFA <=10 SENSITIVE Sensitive     CLINDAMYCIN <=0.25 SENSITIVE Sensitive     RIFAMPIN <=0.5 SENSITIVE Sensitive     CEFOXITIN SCREEN POSITIVE Resistant     Inducible Clindamycin NEGATIVE Sensitive     * >=100,000 COLONIES/mL METHICILLIN RESISTANT STAPHYLOCOCCUS AUREUS    Medical History: Past Medical History  Diagnosis Date  . Non-obstructive CAD     a. s/p multiple caths @ [facility] - last reportedly 06/2012;  b. 06/2013 Lexi MV: EF 51%, no ischemia/infarct.  Marland Kitchen NICM (nonischemic cardiomyopathy) (HCC)     a. EF reportedly < 30% 01/2013 ([facility]);  b. 01/2012 s/p MDT DTBB1D1 Viva S CRT-D;  c. 12/2012  Echo: EF 45-50%, inf/post HK, nl RV, mild MR.  . Diabetes mellitus without complication (HCC)   . A-fib (HCC)     a. Permanent, on Xarelto.  . H/O: GI bleed     a. 12/2012 EGD: HH, evidence of reflux. Erosive Gastritis. Nl duodenum;  b. 12/2012 Colonoscopy: Sev sigmoid and desc colon diverticulosis, transverse and ascending colon diverticulosis, nonbleeding internal hemorrhoids, no bleeding.  . Iron deficiency anemia   . Biventricular ICD (implantable cardioverter-defibrillator) -Medtronic 01/12/2013    a. 01/2012 [facility]:  01/2012 s/p MDT DTBB1D1 Viva S CRT-D.  Atrial port capped (afib).  . Diverticulosis     a. 12/2012 noted on colonoscopy.  . Hiatal hernia     a. 12/2012 noted on EGD.  Marland Kitchen Gastritis     a. 12/2012 noted on EGD.  Marland Kitchen Hypertension   . AICD (automatic cardioverter/defibrillator) present   . Presence of permanent cardiac pacemaker    Assessment: 82 yo male seen in ED on 04/11/15 with right sided rib pain/fracture after a a fall. Patient was discharged on Keflex following UA screen. Urine cultures on 2/24 show >100,000 MRSA. MRSA sensitive to Bactrim and Clindamycin.  Patient has a pacemaker, taking digoxin, and lisinopril - Clindamycin will be a better option for this patient due to lack of K+ monitoring outpatient.     Plan:  After discussion with Dr. Lenard Lance Clindamycin 600mg  every 8 hours will be called in  for patient.   Patient is no longing residing at [facility], which is the only number provided on file, so patient could not be contacted regarding culture results. Spoke with someone at facility who did not have any updated contact information for the patient.    2/27: Located contact information for the patient. Spoke with wife today at 71. Explained to her that he should stop current antibiotics and to start new antibiotic that is being called in. RX was called into [facility] in [place] on 2/27 @ 1350.   Cher Nakai, PharmD Pharmacy  Resident  04/14/2015,7:37 PM

## 2015-04-18 ENCOUNTER — Encounter: Payer: Self-pay | Admitting: Cardiovascular Disease

## 2015-04-18 ENCOUNTER — Telehealth: Payer: Self-pay | Admitting: Cardiovascular Disease

## 2015-04-18 NOTE — Telephone Encounter (Signed)
Attempted to schedule fu from recall list.  Unable to contact patient via phone or contacts via phone.   Sending letter.  Deleting recall.

## 2015-06-22 ENCOUNTER — Emergency Department
Admission: EM | Admit: 2015-06-22 | Discharge: 2015-06-26 | Disposition: A | Discharge status: Other Acute Inpatient Hospital | Payer: Medicare Other | Attending: Emergency Medicine | Admitting: Emergency Medicine

## 2015-06-22 ENCOUNTER — Emergency Department: Payer: Medicare Other

## 2015-06-22 DIAGNOSIS — I5042 Chronic combined systolic (congestive) and diastolic (congestive) heart failure: Secondary | ICD-10-CM | POA: Insufficient documentation

## 2015-06-22 DIAGNOSIS — I2510 Atherosclerotic heart disease of native coronary artery without angina pectoris: Secondary | ICD-10-CM | POA: Insufficient documentation

## 2015-06-22 DIAGNOSIS — I110 Hypertensive heart disease with heart failure: Secondary | ICD-10-CM | POA: Insufficient documentation

## 2015-06-22 DIAGNOSIS — T50901A Poisoning by unspecified drugs, medicaments and biological substances, accidental (unintentional), initial encounter: Secondary | ICD-10-CM

## 2015-06-22 DIAGNOSIS — F0391 Unspecified dementia with behavioral disturbance: Secondary | ICD-10-CM | POA: Diagnosis not present

## 2015-06-22 DIAGNOSIS — Z7982 Long term (current) use of aspirin: Secondary | ICD-10-CM | POA: Insufficient documentation

## 2015-06-22 DIAGNOSIS — F32A Depression, unspecified: Secondary | ICD-10-CM

## 2015-06-22 DIAGNOSIS — Z7984 Long term (current) use of oral hypoglycemic drugs: Secondary | ICD-10-CM | POA: Insufficient documentation

## 2015-06-22 DIAGNOSIS — Z950 Presence of cardiac pacemaker: Secondary | ICD-10-CM | POA: Insufficient documentation

## 2015-06-22 DIAGNOSIS — F0393 Unspecified dementia, unspecified severity, with mood disturbance: Secondary | ICD-10-CM

## 2015-06-22 DIAGNOSIS — I4891 Unspecified atrial fibrillation: Secondary | ICD-10-CM | POA: Diagnosis not present

## 2015-06-22 DIAGNOSIS — E119 Type 2 diabetes mellitus without complications: Secondary | ICD-10-CM | POA: Diagnosis not present

## 2015-06-22 DIAGNOSIS — R45851 Suicidal ideations: Secondary | ICD-10-CM | POA: Diagnosis present

## 2015-06-22 DIAGNOSIS — I420 Dilated cardiomyopathy: Secondary | ICD-10-CM | POA: Insufficient documentation

## 2015-06-22 DIAGNOSIS — F0390 Unspecified dementia without behavioral disturbance: Secondary | ICD-10-CM

## 2015-06-22 DIAGNOSIS — Z79899 Other long term (current) drug therapy: Secondary | ICD-10-CM | POA: Insufficient documentation

## 2015-06-22 DIAGNOSIS — F329 Major depressive disorder, single episode, unspecified: Secondary | ICD-10-CM

## 2015-06-22 DIAGNOSIS — Z87891 Personal history of nicotine dependence: Secondary | ICD-10-CM | POA: Insufficient documentation

## 2015-06-22 DIAGNOSIS — T50902A Poisoning by unspecified drugs, medicaments and biological substances, intentional self-harm, initial encounter: Secondary | ICD-10-CM | POA: Diagnosis not present

## 2015-06-22 LAB — COMPREHENSIVE METABOLIC PANEL
ALBUMIN: 4 g/dL (ref 3.5–5.0)
ALT: 16 U/L — ABNORMAL LOW (ref 17–63)
ANION GAP: 13 (ref 5–15)
AST: 31 U/L (ref 15–41)
Alkaline Phosphatase: 60 U/L (ref 38–126)
BILIRUBIN TOTAL: 1 mg/dL (ref 0.3–1.2)
BUN: 22 mg/dL — ABNORMAL HIGH (ref 6–20)
CO2: 23 mmol/L (ref 22–32)
Calcium: 9.7 mg/dL (ref 8.9–10.3)
Chloride: 103 mmol/L (ref 101–111)
Creatinine, Ser: 1.14 mg/dL (ref 0.61–1.24)
GFR calc Af Amer: >60 mL/min (ref >60)
GFR calc non Af Amer: 59 mL/min — ABNORMAL LOW (ref >60)
GLUCOSE: 142 mg/dL — AB (ref 65–99)
POTASSIUM: 3.8 mmol/L (ref 3.5–5.1)
SODIUM: 139 mmol/L (ref 135–145)
TOTAL PROTEIN: 7.2 g/dL (ref 6.5–8.1)

## 2015-06-22 LAB — CBC WITH DIFFERENTIAL/PLATELET
Basophils Absolute: 0.1 10*3/uL (ref 0–0.1)
Basophils Relative: 1 %
Eosinophils Absolute: 0.1 10*3/uL (ref 0–0.7)
HEMATOCRIT: 44.1 % (ref 40.0–52.0)
Hemoglobin: 14.2 g/dL (ref 13.0–18.0)
LYMPHS ABS: 1.4 10*3/uL (ref 1.0–3.6)
MCH: 26 pg (ref 26.0–34.0)
MCHC: 32.1 g/dL (ref 32.0–36.0)
MCV: 80.7 fL (ref 80.0–100.0)
MONO ABS: 0.8 10*3/uL (ref 0.2–1.0)
Monocytes Relative: 9 %
Neutro Abs: 6.7 10*3/uL — ABNORMAL HIGH (ref 1.4–6.5)
Neutrophils Relative %: 74 %
PLATELETS: 198 10*3/uL (ref 150–440)
RBC: 5.46 MIL/uL (ref 4.40–5.90)
RDW: 17.6 % — AB (ref 11.5–14.5)
WBC: 9.1 10*3/uL (ref 3.8–10.6)

## 2015-06-22 LAB — URINALYSIS COMPLETE WITH MICROSCOPIC (ARMC ONLY)
BILIRUBIN URINE: NEGATIVE
Bacteria, UA: NONE SEEN
GLUCOSE, UA: NEGATIVE mg/dL
Hgb urine dipstick: NEGATIVE
KETONES UR: NEGATIVE mg/dL
Leukocytes, UA: NEGATIVE
NITRITE: NEGATIVE
Protein, ur: NEGATIVE mg/dL
RBC / HPF: NONE SEEN RBC/hpf (ref 0–5)
SPECIFIC GRAVITY, URINE: 1.009 (ref 1.005–1.030)
pH: 5 (ref 5.0–8.0)

## 2015-06-22 LAB — URINE DRUG SCREEN, QUALITATIVE (ARMC ONLY)
AMPHETAMINES, UR SCREEN: NOT DETECTED
Barbiturates, Ur Screen: NOT DETECTED
Benzodiazepine, Ur Scrn: NOT DETECTED
COCAINE METABOLITE, UR ~~LOC~~: NOT DETECTED
Cannabinoid 50 Ng, Ur ~~LOC~~: NOT DETECTED
MDMA (ECSTASY) UR SCREEN: NOT DETECTED
Methadone Scn, Ur: NOT DETECTED
OPIATE, UR SCREEN: NOT DETECTED
PHENCYCLIDINE (PCP) UR S: NOT DETECTED
Tricyclic, Ur Screen: NOT DETECTED

## 2015-06-22 LAB — ACETAMINOPHEN LEVEL: Acetaminophen (Tylenol), Serum: 14 ug/mL (ref 10–30)

## 2015-06-22 LAB — SALICYLATE LEVEL

## 2015-06-22 LAB — CK: Total CK: 131 U/L (ref 49–397)
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
## 2015-06-22 NOTE — ED Notes (Signed)
Pt in bed watching tv, will continue monitor q15 min

## 2015-06-22 NOTE — ED Notes (Signed)
Pt present here via [facility] from home - pt reports that he and his wife have been arguing a lot - he reports that the other day he told her "I will be better off dead - so I went to the kitchen and got a butcher knife and she stopped me and gave me a whole bottle of sleeping pills instead  - so i took 6-8 of those pills - I guess that it knocked me out so bad that I fell out of the bed into the floor - I laid on the floor for two days and one night til yesterday when the physical therapist came and he helped me get up - today I guess I don't know."   Wife called DSS today - DSS called [place] [employer]     History  CHF, CAD, GERD, DM II

## 2015-06-22 NOTE — ED Provider Notes (Signed)
[facility] Emergency Department Provider Note    ____________________________________________  Time seen: ~1505  I have reviewed the triage vital signs and the nursing notes.   HISTORY  Chief Complaint Mental Health Problem   History limited by: Not Limited   HPI Nicolas Morris is a 82 y.o. male who presents to the emergency department today because of concerns for suicidal ideation and overdose. Patient states that he got in an argument with his wife 2 days ago. He states this happens frequently because he feels like she only speaks for him. He states during the argument 2 days ago he picked up a butcher knife and threatened to kill himself in an attempt to end the argument. The patient then states that his wife handed him some pills of which he took 6. He thinks these were aspirin. He states he did that with the intent of harming himself. He states was not able to eat anymore because he felt nauseous. He states he then slept on the ground for 2 days. He states he is feeling better now. Denies any current suicidal ideation.   Past Medical History  Diagnosis Date  . Non-obstructive CAD     a. s/p multiple caths @ [facility] - last reportedly 06/2012;  b. 06/2013 Lexi MV: EF 51%, no ischemia/infarct.  Marland Kitchen NICM (nonischemic cardiomyopathy) (HCC)     a. EF reportedly < 30% 01/2013 ([facility]);  b. 01/2012 s/p MDT DTBB1D1 Viva S CRT-D;  c. 12/2012 Echo: EF 45-50%, inf/post HK, nl RV, mild MR.  . Diabetes mellitus without complication (HCC)   . A-fib (HCC)     a. Permanent, on Xarelto.  . H/O: GI bleed     a. 12/2012 EGD: HH, evidence of reflux. Erosive Gastritis. Nl duodenum;  b. 12/2012 Colonoscopy: Sev sigmoid and desc colon diverticulosis, transverse and ascending colon diverticulosis, nonbleeding internal hemorrhoids, no bleeding.  . Iron deficiency anemia   . Biventricular ICD (implantable cardioverter-defibrillator) -Medtronic 01/12/2013    a. 01/2012 [facility]:   01/2012 s/p MDT DTBB1D1 Viva S CRT-D.  Atrial port capped (afib).  . Diverticulosis     a. 12/2012 noted on colonoscopy.  . Hiatal hernia     a. 12/2012 noted on EGD.  Marland Kitchen Gastritis     a. 12/2012 noted on EGD.  Marland Kitchen Hypertension   . AICD (automatic cardioverter/defibrillator) present   . Presence of permanent cardiac pacemaker     Patient Active Problem List   Diagnosis Date Noted  . Iron deficiency anemia 10/24/2014  . GIB (gastrointestinal bleeding) 08/30/2014  . Hyponatremia 08/30/2014  . Elevated troponin 08/30/2014  . External hemorrhoids 08/30/2014  . Diverticulosis of colon without hemorrhage 08/30/2014  . Gastritis and gastroduodenitis 08/30/2014  . Acute posthemorrhagic anemia 08/25/2014  . Unsteady gait 03/04/2014  . NICM (nonischemic cardiomyopathy) (HCC) 07/20/2013  . Diabetes mellitus without complication (HCC)   . Diverticulosis   . Hiatal hernia   . Non-obstructive CAD   . Neck pain 04/21/2013  . Depression 01/26/2013  . Atrial fibrillation-permanent 01/12/2013  . Biventricular ICD (implantable cardioverter-defibrillator) -Medtronic 01/12/2013  . Congestive dilated cardiomyopathy (HCC) 01/12/2013  . GI bleed 01/12/2013  . Chronic combined systolic and diastolic CHF (congestive heart failure) (HCC) 01/12/2013  . Biventricular ICD (implantable cardioverter-defibrillator) -Medtronic 01/12/2013    Past Surgical History  Procedure Laterality Date  . Cardiac catheterization  2013    [facility]   . Cardiac catheterization  2011  . Defib implant  01/27/2012    Serial #  [id number] H Model # [id number]  . Pacemaker insertion  01/27/2012  . Colonoscopy  32014  . Upper gastrointestinal endoscopy  2014  . Fetal blood transfusion  2014  . Cardiac defibrillator placement    . Esophagogastroduodenoscopy (egd) with propofol N/A 08/29/2014    Procedure: ESOPHAGOGASTRODUODENOSCOPY (EGD) WITH PROPOFOL;  Surgeon: Elnita Maxwell, MD;  Location: [facility] ENDOSCOPY;  Service: Endoscopy;   Laterality: N/A;  . Colonoscopy with propofol N/A 08/29/2014    Procedure: COLONOSCOPY WITH PROPOFOL;  Surgeon: Elnita Maxwell, MD;  Location: [facility] ENDOSCOPY;  Service: Endoscopy;  Laterality: N/A;    Current Outpatient Rx  Name  Route  Sig  Dispense  Refill  . acetaminophen (TYLENOL) 500 MG tablet   Oral   Take 500 mg by mouth 3 (three) times daily.         Marland Kitchen aspirin 81 MG chewable tablet   Oral   Chew by mouth daily.         Marland Kitchen buPROPion (WELLBUTRIN XL) 150 MG 24 hr tablet   Oral   Take by mouth.         . carvedilol (COREG) 12.5 MG tablet   Oral   Take 12.5 mg by mouth 2 (two) times daily with a meal.         . digoxin (LANOXIN) 0.25 MG tablet   Oral   Take 0.25 mg by mouth daily.         . ferrous sulfate 325 (65 FE) MG tablet   Oral   Take 1 tablet (325 mg total) by mouth 2 (two) times daily with a meal.   60 tablet   3   . finasteride (PROSCAR) 5 MG tablet   Oral   Take 5 mg by mouth daily.         . furosemide (LASIX) 40 MG tablet   Oral   Take 40 mg by mouth.         . gabapentin (NEURONTIN) 300 MG capsule   Oral   Take 300 mg by mouth 3 (three) times daily.          Marland Kitchen lisinopril (PRINIVIL,ZESTRIL) 2.5 MG tablet   Oral   Take 2.5 mg by mouth daily.         Marland Kitchen EXPIRED: lovastatin (MEVACOR) 20 MG tablet   Oral   Take 20 mg by mouth at bedtime.          . meclizine (ANTIVERT) 25 MG tablet   Oral   Take 25 mg by mouth 2 (two) times daily.         . metFORMIN (GLUCOPHAGE) 500 MG tablet   Oral   Take 1,000 mg by mouth 2 (two) times daily with a meal.          . pantoprazole (PROTONIX) 40 MG tablet   Oral   Take 40 mg by mouth daily.          . polyethylene glycol (MIRALAX / GLYCOLAX) packet   Oral   Take 17 g by mouth daily as needed for mild constipation or moderate constipation.         . potassium chloride SA (K-DUR,KLOR-CON) 20 MEQ tablet               . rivaroxaban (XARELTO) 20 MG TABS tablet   Oral    Take 20 mg by mouth daily with supper.         . sertraline (ZOLOFT) 100 MG tablet   Oral   Take 100  mg by mouth daily.         . tamsulosin (FLOMAX) 0.4 MG CAPS capsule   Oral   Take 0.4 mg by mouth.         . traMADol (ULTRAM) 50 MG tablet   Oral   Take 1 tablet (50 mg total) by mouth every 6 (six) hours as needed.   15 tablet   0   . traZODone (DESYREL) 50 MG tablet   Oral   Take 150 mg by mouth at bedtime.           Allergies No known allergies  Family History  Problem Relation Age of Onset  . Family history unknown: Yes    Social History Social History  Substance Use Topics  . Smoking status: Former Smoker -- 3.00 packs/day for 45 years    Types: Cigarettes    Quit date: 01/13/2003  . Smokeless tobacco: Not on file  . Alcohol Use: No    Review of Systems  Constitutional: Negative for fever. Cardiovascular: Negative for chest pain. Respiratory: Negative for shortness of breath. Gastrointestinal: Negative for abdominal pain, vomiting and diarrhea.Positive for some nausea. Neurological: Negative for headaches, focal weakness or numbness.   10-point ROS otherwise negative.  ____________________________________________   PHYSICAL EXAM:  VITAL SIGNS: ED Triage Vitals  Enc Vitals Group     BP 06/22/15 1359 115/58 mmHg     Pulse Rate 06/22/15 1359 81     Resp 06/22/15 1359 18     Temp 06/22/15 1359 97.7 F (36.5 C)     Temp Source 06/22/15 1359 Oral     SpO2 06/22/15 1359 94 %     Weight 06/22/15 1359 233 lb (105.688 kg)     Height 06/22/15 1359  (1.88 m)     Head Cir --      Peak Flow --      Pain Score 06/22/15 1429 8   Constitutional: Alert and oriented. Well appearing and in no distress. Eyes: Conjunctivae are normal. PERRL. Normal extraocular movements. ENT   Head: Normocephalic and atraumatic.   Nose: No congestion/rhinnorhea.   Mouth/Throat: Mucous membranes are moist.   Neck: No  stridor. Hematological/Lymphatic/Immunilogical: No cervical lymphadenopathy. Cardiovascular: Normal rate, regular rhythm.  No murmurs, rubs, or gallops. Respiratory: Normal respiratory effort without tachypnea nor retractions. Breath sounds are clear and equal bilaterally. No wheezes/rales/rhonchi. Gastrointestinal: Soft and nontender. No distention.  Genitourinary: Deferred Musculoskeletal: Normal range of motion in all extremities. No joint effusions.  No lower extremity tenderness nor edema. Neurologic:  Normal speech and language. No gross focal neurologic deficits are appreciated.  Skin:  Skin is warm, dry and intact. No rash noted. Psychiatric: Mood and affect are normal. Speech and behavior are normal. Patient exhibits appropriate insight and judgment.  ____________________________________________    LABS (pertinent positives/negatives)  Labs Reviewed  COMPREHENSIVE METABOLIC PANEL - Abnormal; Notable for the following:    Glucose, Bld 142 (*)    BUN 22 (*)    ALT 16 (*)    GFR calc non Af Amer 59 (*)    All other components within normal limits  CBC WITH DIFFERENTIAL/PLATELET - Abnormal; Notable for the following:    RDW 17.6 (*)    Neutro Abs 6.7 (*)    All other components within normal limits  ACETAMINOPHEN LEVEL  SALICYLATE LEVEL  CK  CBC WITH DIFFERENTIAL/PLATELET  URINALYSIS COMPLETEWITH MICROSCOPIC ([facility] ONLY)  URINE DRUG SCREEN, QUALITATIVE ([facility] ONLY)     ____________________________________________   EKG  Lurline Idol, attending physician, personally viewed and interpreted this EKG  EKG Time: 1717 Rate: 80 Rhythm: ventricular paced rhythm Axis: left axis Intervals: qtc 519 QRS: wide ST changes: no st elevation equivalent Impression: abnormal ekg   ____________________________________________    RADIOLOGY  None  ____________________________________________   PROCEDURES  Procedure(s) performed: None  Critical Care performed:  No  ____________________________________________   INITIAL IMPRESSION / ASSESSMENT AND PLAN / ED COURSE  Pertinent labs & imaging results that were available during my care of the patient were reviewed by me and considered in my medical decision making (see chart for details).  Patient presented to the emergency department today because of concerns for suicidal ideation and possible overdose. On exam patient is a not in any acute distress. Denies any current suicidal ideation. This point I do not feel patient requires an IVC however will have psychiatry evaluate. Furthermore will check blood work including salicylate and Tylenol levels.  ----------------------------------------- 5:22 PM on 06/22/2015 -----------------------------------------  Psychiatry has seen patient and recommends [facility] psych placement. TTS is aware. Placement pending. Blood work without any concerning findings.  ____________________________________________   FINAL CLINICAL IMPRESSION(S) / ED DIAGNOSES  Final diagnoses:  Depression     Phineas Semen, MD 06/22/15 1725

## 2015-06-22 NOTE — ED Notes (Signed)
Pt assisted to bathroom, pt then given cup of orange juice

## 2015-06-22 NOTE — ED Notes (Signed)
BEHAVIORAL HEALTH ROUNDING Patient sleeping: No. Patient alert and oriented: yes Behavior appropriate: Yes.  ; If no, describe:  Nutrition and fluids offered: yes Toileting and hygiene offered: Yes  Sitter present: q15 minute observations and security  monitoring Law enforcement present: Yes  ODS  

## 2015-06-22 NOTE — ED Notes (Signed)

## 2015-06-22 NOTE — Consult Note (Signed)
[facility] Consult   Reason for Consult:  PAtient with intentioanal overdose Referring Physician:  Archie Balboa Patient Identification: Nicolas Morris MRN:  [id number] Principal Diagnosis: Overdose Diagnosis:   Patient Active Problem List   Diagnosis Date Noted  . Overdose [T50.901A] 06/22/2015  . Dementia, senile with depression [F03.90] 06/22/2015  . Iron deficiency anemia [D50.9] 10/24/2014  . GIB (gastrointestinal bleeding) [K92.2] 08/30/2014  . Hyponatremia [E87.1] 08/30/2014  . Elevated troponin [R79.89] 08/30/2014  . External hemorrhoids [K64.8] 08/30/2014  . Diverticulosis of colon without hemorrhage [K57.30] 08/30/2014  . Gastritis and gastroduodenitis [K29.70, K29.90] 08/30/2014  . Acute posthemorrhagic anemia [D62] 08/25/2014  . Unsteady gait [R26.81] 03/04/2014  . NICM (nonischemic cardiomyopathy) ([place]) [I42.9] 07/20/2013  . Diabetes mellitus without complication ([place]) [J57.0]   . Diverticulosis [K57.90]   . Hiatal hernia [K44.9]   . Non-obstructive CAD [I25.10]   . Neck pain [M54.2] 04/21/2013  . Depression [F32.9] 01/26/2013  . Atrial fibrillation-permanent [I48.91] 01/12/2013  . Biventricular ICD (implantable cardioverter-defibrillator) -Medtronic [V77.939] 01/12/2013  . Congestive dilated cardiomyopathy ([place]) [I42.0] 01/12/2013  . GI bleed [K92.2] 01/12/2013  . Chronic combined systolic and diastolic CHF (congestive heart failure) ([place]) [I50.42] 01/12/2013  . Biventricular ICD (implantable cardioverter-defibrillator) -Medtronic [Z95.810] 01/12/2013    Total Time spent with patient: 45 minutes  Subjective:   Nicolas Morris is a 82 y.o. male patient admitted with "I dont know".  HPI:  82 year old man with history of dementia took intentional overdose of unknown type of medication. Currently alert but still a little confused. Admits he was going to stab himself until wife gave him pills and told him to overdose. Unknown why he was allowed to lie on the  floor for perhaps a day until EMS called. Patient with depressed mood, hopelessness, confusion. Unclear social situation. Poor insight. Likely to need inpatient geropsych treatment  Past Psychiatric History: Possible treatment for depression. Appears demented. Patient poor historian  Risk to Self: Suicidal Ideation: Yes-Currently Present Suicidal Intent: Yes-Currently Present Is patient at risk for suicide?: Yes Suicidal Plan?: Yes-Currently Present Specify Current Suicidal Plan: Overdose on Medication Access to Means: Yes Specify Access to Suicidal Means: Medication What has been your use of drugs/alcohol within the last 12 months?: Reports of none How many times?: 0 Other Self Harm Risks: Reports of none Triggers for Past Attempts: None known Intentional Self Injurious Behavior: None Risk to Others: Homicidal Ideation: No Thoughts of Harm to Others: No Current Homicidal Intent: No Current Homicidal Plan: No Access to Homicidal Means: No Identified Victim: Reports of none History of harm to others?: No Assessment of Violence: None Noted Violent Behavior Description: Reports of none Does patient have access to weapons?: No Criminal Charges Pending?: No Does patient have a court date: No Prior Inpatient Therapy: Prior Inpatient Therapy: No Prior Therapy Dates: Reports of none Prior Therapy Facilty/Provider(s): Reports of none Reason for Treatment: Reports of none Prior Outpatient Therapy: Prior Outpatient Therapy: No Prior Therapy Dates: Reports of none Prior Therapy Facilty/Provider(s): Reports of none Reason for Treatment: Reports of none Does patient have an ACCT team?: No Does patient have Intensive In-House Services?  : No Does patient have [facility] services? : No Does patient have P4CC services?: No  Past Medical History:  Past Medical History  Diagnosis Date  . Non-obstructive CAD     a. s/p multiple caths @ [facility] - last reportedly 06/2012;  b. 06/2013 Lexi MV: EF  51%, no ischemia/infarct.  Marland Kitchen NICM (nonischemic cardiomyopathy) ([place])  a. EF reportedly < 30% 01/2013 ([facility]);  b. 01/2012 s/p MDT DTBB1D1 Viva S CRT-D;  c. 12/2012 Echo: EF 45-50%, inf/post HK, nl RV, mild MR.  . Diabetes mellitus without complication ([place])   . A-fib ([place])     a. Permanent, on Xarelto.  . H/O: GI bleed     a. 12/2012 EGD: HH, evidence of reflux. Erosive Gastritis. Nl duodenum;  b. 12/2012 Colonoscopy: Sev sigmoid and desc colon diverticulosis, transverse and ascending colon diverticulosis, nonbleeding internal hemorrhoids, no bleeding.  . Iron deficiency anemia   . Biventricular ICD (implantable cardioverter-defibrillator) -Medtronic 01/12/2013    a. 01/2012 [facility]:  01/2012 s/p MDT DTBB1D1 Viva S CRT-D.  Atrial port capped (afib).  . Diverticulosis     a. 12/2012 noted on colonoscopy.  . Hiatal hernia     a. 12/2012 noted on EGD.  Marland Kitchen Gastritis     a. 12/2012 noted on EGD.  Marland Kitchen Hypertension   . AICD (automatic cardioverter/defibrillator) present   . Presence of permanent cardiac pacemaker     Past Surgical History  Procedure Laterality Date  . Cardiac catheterization  2013    [facility]   . Cardiac catheterization  2011  . Defib implant  01/27/2012    Serial # [id number] H Model # [id number]  . Pacemaker insertion  01/27/2012  . Colonoscopy  32014  . Upper gastrointestinal endoscopy  2014  . Fetal blood transfusion  2014  . Cardiac defibrillator placement    . Esophagogastroduodenoscopy (egd) with propofol N/A 08/29/2014    Procedure: ESOPHAGOGASTRODUODENOSCOPY (EGD) WITH PROPOFOL;  Surgeon: Josefine Class, MD;  Location: [facility] ENDOSCOPY;  Service: Endoscopy;  Laterality: N/A;  . Colonoscopy with propofol N/A 08/29/2014    Procedure: COLONOSCOPY WITH PROPOFOL;  Surgeon: Josefine Class, MD;  Location: [facility] ENDOSCOPY;  Service: Endoscopy;  Laterality: N/A;   Family History:  Family History  Problem Relation Age of Onset  . Family history unknown: Yes   Family  Psychiatric  History: Unknown Social History:  History  Alcohol Use No     History  Drug Use No    Social History   Social History  . Marital Status: Married    Spouse Name: N/A  . Number of Children: N/A  . Years of Education: N/A   Social History Main Topics  . Smoking status: Former Smoker -- 3.00 packs/day for 45 years    Types: Cigarettes    Quit date: 01/13/2003  . Smokeless tobacco: Not on file  . Alcohol Use: No  . Drug Use: No  . Sexual Activity: Not on file   Other Topics Concern  . Not on file   Social History Narrative   Additional Social History:    Allergies:   Allergies  Allergen Reactions  . No Known Allergies     Labs:  Results for orders placed or performed during the hospital encounter of 06/22/15 (from the past 48 hour(s))  Comprehensive metabolic panel     Status: Abnormal   Collection Time: 06/22/15  2:15 PM  Result Value Ref Range   Sodium 139 135 - 145 mmol/L   Potassium 3.8 3.5 - 5.1 mmol/L   Chloride 103 101 - 111 mmol/L   CO2 23 22 - 32 mmol/L   Glucose, Bld 142 (H) 65 - 99 mg/dL   BUN 22 (H) 6 - 20 mg/dL   Creatinine, Ser 1.14 0.61 - 1.24 mg/dL   Calcium 9.7 8.9 - 10.3 mg/dL   Total Protein 7.2 6.5 - 8.1  g/dL   Albumin 4.0 3.5 - 5.0 g/dL   AST 31 15 - 41 U/L   ALT 16 (L) 17 - 63 U/L   Alkaline Phosphatase 60 38 - 126 U/L   Total Bilirubin 1.0 0.3 - 1.2 mg/dL   GFR calc non Af Amer 59 (L) >60 mL/min   GFR calc Af Amer >60 >60 mL/min    Comment: (NOTE) The eGFR has been calculated using the CKD EPI equation. This calculation has not been validated in all clinical situations. eGFR's persistently <60 mL/min signify possible Chronic Kidney Disease.    Anion gap 13 5 - 15  Acetaminophen level     Status: None   Collection Time: 06/22/15  2:15 PM  Result Value Ref Range   Acetaminophen (Tylenol), Serum 14 10 - 30 ug/mL    Comment:        THERAPEUTIC CONCENTRATIONS VARY SIGNIFICANTLY. A RANGE OF 10-30 ug/mL MAY BE AN  EFFECTIVE CONCENTRATION FOR MANY PATIENTS. HOWEVER, SOME ARE BEST TREATED AT CONCENTRATIONS OUTSIDE THIS RANGE. ACETAMINOPHEN CONCENTRATIONS >150 ug/mL AT 4 HOURS AFTER INGESTION AND >50 ug/mL AT 12 HOURS AFTER INGESTION ARE OFTEN ASSOCIATED WITH TOXIC REACTIONS.   Salicylate level     Status: None   Collection Time: 06/22/15  2:15 PM  Result Value Ref Range   Salicylate Lvl <4.6 2.8 - 30.0 mg/dL  CK     Status: None   Collection Time: 06/22/15  2:15 PM  Result Value Ref Range   Total CK 131 49 - 397 U/L  CBC with Differential     Status: Abnormal   Collection Time: 06/22/15  4:07 PM  Result Value Ref Range   WBC 9.1 3.8 - 10.6 K/uL   RBC 5.46 4.40 - 5.90 MIL/uL   Hemoglobin 14.2 13.0 - 18.0 g/dL   HCT 44.1 40.0 - 52.0 %   MCV 80.7 80.0 - 100.0 fL   MCH 26.0 26.0 - 34.0 pg   MCHC 32.1 32.0 - 36.0 g/dL   RDW 17.6 (H) 11.5 - 14.5 %   Platelets 198 150 - 440 K/uL   Neutrophils Relative % 74% %   Neutro Abs 6.7 (H) 1.4 - 6.5 K/uL   Lymphocytes Relative 15% %   Lymphs Abs 1.4 1.0 - 3.6 K/uL   Monocytes Relative 9% %   Monocytes Absolute 0.8 0.2 - 1.0 K/uL   Eosinophils Relative 1% %   Eosinophils Absolute 0.1 0 - 0.7 K/uL   Basophils Relative 1% %   Basophils Absolute 0.1 0 - 0.1 K/uL  Urinalysis complete, with microscopic ([facility] only)     Status: Abnormal   Collection Time: 06/22/15  5:03 PM  Result Value Ref Range   Color, Urine YELLOW (A) YELLOW   APPearance CLEAR (A) CLEAR   Glucose, UA NEGATIVE NEGATIVE mg/dL   Bilirubin Urine NEGATIVE NEGATIVE   Ketones, ur NEGATIVE NEGATIVE mg/dL   Specific Gravity, Urine 1.009 1.005 - 1.030   Hgb urine dipstick NEGATIVE NEGATIVE   pH 5.0 5.0 - 8.0   Protein, ur NEGATIVE NEGATIVE mg/dL   Nitrite NEGATIVE NEGATIVE   Leukocytes, UA NEGATIVE NEGATIVE   RBC / HPF NONE SEEN 0 - 5 RBC/hpf   WBC, UA 0-5 0 - 5 WBC/hpf   Bacteria, UA NONE SEEN NONE SEEN   Squamous Epithelial / LPF 0-5 (A) NONE SEEN   Mucous PRESENT    Hyaline  Casts, UA PRESENT   Urine Drug Screen, Qualitative ([facility] only)     Status: None  Collection Time: 06/22/15  5:03 PM  Result Value Ref Range   Tricyclic, Ur Screen NONE DETECTED NONE DETECTED   Amphetamines, Ur Screen NONE DETECTED NONE DETECTED   MDMA (Ecstasy)Ur Screen NONE DETECTED NONE DETECTED   Cocaine Metabolite,Ur [place] NONE DETECTED NONE DETECTED   Opiate, Ur Screen NONE DETECTED NONE DETECTED   Phencyclidine (PCP) Ur S NONE DETECTED NONE DETECTED   Cannabinoid 50 Ng, Ur [place] NONE DETECTED NONE DETECTED   Barbiturates, Ur Screen NONE DETECTED NONE DETECTED   Benzodiazepine, Ur Scrn NONE DETECTED NONE DETECTED   Methadone Scn, Ur NONE DETECTED NONE DETECTED    Comment: (NOTE) 301  Tricyclics, urine               Cutoff 1000 ng/mL 200  Amphetamines, urine             Cutoff 1000 ng/mL 300  MDMA (Ecstasy), urine           Cutoff 500 ng/mL 400  Cocaine Metabolite, urine       Cutoff 300 ng/mL 500  Opiate, urine                   Cutoff 300 ng/mL 600  Phencyclidine (PCP), urine      Cutoff 25 ng/mL 700  Cannabinoid, urine              Cutoff 50 ng/mL 800  Barbiturates, urine             Cutoff 200 ng/mL 900  Benzodiazepine, urine           Cutoff 200 ng/mL 1000 Methadone, urine                Cutoff 300 ng/mL 1100 1200 The urine drug screen provides only a preliminary, unconfirmed 1300 analytical test result and should not be used for non-medical 1400 purposes. Clinical consideration and professional judgment should 1500 be applied to any positive drug screen result due to possible 1600 interfering substances. A more specific alternate chemical method 1700 must be used in order to obtain a confirmed analytical result.  1800 Gas chromato graphy / mass spectrometry (GC/MS) is the preferred 1900 confirmatory method.     No current facility-administered medications for this encounter.   Current Outpatient Prescriptions  Medication Sig Dispense Refill  . acetaminophen (TYLENOL)  500 MG tablet Take 500 mg by mouth 3 (three) times daily.    Marland Kitchen aspirin 81 MG chewable tablet Chew by mouth daily.    Marland Kitchen buPROPion (WELLBUTRIN XL) 150 MG 24 hr tablet Take by mouth.    . carvedilol (COREG) 12.5 MG tablet Take 12.5 mg by mouth 2 (two) times daily with a meal.    . digoxin (LANOXIN) 0.25 MG tablet Take 0.25 mg by mouth daily.    . ferrous sulfate 325 (65 FE) MG tablet Take 1 tablet (325 mg total) by mouth 2 (two) times daily with a meal. 60 tablet 3  . finasteride (PROSCAR) 5 MG tablet Take 5 mg by mouth daily.    . furosemide (LASIX) 40 MG tablet Take 40 mg by mouth.    . gabapentin (NEURONTIN) 300 MG capsule Take 300 mg by mouth 3 (three) times daily.     Marland Kitchen lisinopril (PRINIVIL,ZESTRIL) 2.5 MG tablet Take 2.5 mg by mouth daily.    Marland Kitchen lovastatin (MEVACOR) 20 MG tablet Take 20 mg by mouth at bedtime.     . meclizine (ANTIVERT) 25 MG tablet Take 25 mg by mouth 2 (two) times  daily.    . metFORMIN (GLUCOPHAGE) 500 MG tablet Take 1,000 mg by mouth 2 (two) times daily with a meal.     . pantoprazole (PROTONIX) 40 MG tablet Take 40 mg by mouth daily.     . polyethylene glycol (MIRALAX / GLYCOLAX) packet Take 17 g by mouth daily as needed for mild constipation or moderate constipation.    . potassium chloride SA (K-DUR,KLOR-CON) 20 MEQ tablet     . rivaroxaban (XARELTO) 20 MG TABS tablet Take 20 mg by mouth daily with supper.    . sertraline (ZOLOFT) 100 MG tablet Take 100 mg by mouth daily.    . tamsulosin (FLOMAX) 0.4 MG CAPS capsule Take 0.4 mg by mouth.    . traMADol (ULTRAM) 50 MG tablet Take 1 tablet (50 mg total) by mouth every 6 (six) hours as needed. 15 tablet 0  . traZODone (DESYREL) 50 MG tablet Take 150 mg by mouth at bedtime.      Musculoskeletal: Strength & Muscle Tone: decreased Gait & Station: unsteady Patient leans: N/A  Psychiatric Specialty Exam: Review of Systems  Constitutional: Negative.   HENT: Negative.   Eyes: Negative.   Respiratory: Negative.    Cardiovascular: Negative.   Gastrointestinal: Negative.   Musculoskeletal: Negative.   Skin: Negative.   Neurological: Negative.   Psychiatric/Behavioral: Positive for depression, suicidal ideas and memory loss. Negative for hallucinations and substance abuse. The patient is nervous/anxious and has insomnia.     Blood pressure 115/58, pulse 81, temperature 97.7 F (36.5 C), temperature source Oral, resp. rate 18, height 6' 2"  (1.88 m), weight 105.688 kg (233 lb), SpO2 94 %.Body mass index is 29.9 kg/(m^2).  General Appearance: Disheveled  Eye Contact::  Minimal  Speech:  Garbled  Volume:  Decreased  Mood:  Dysphoric  Affect:  Constricted  Thought Process:  Disorganized  Orientation:  Full (Time, Place, and Person)  Thought Content:  Negative  Suicidal Thoughts:  Yes.  with intent/plan  Homicidal Thoughts:  No  Memory:  Immediate;   Fair Recent;   Poor Remote;   Fair  Judgement:  Impaired  Insight:  Shallow  Psychomotor Activity:  Psychomotor Retardation  Concentration:  Poor  Recall:  Poor  Fund of Knowledge:Poor  Language: Fair  Akathisia:  No  Handed:  Right  AIMS (if indicated):     Assets:  Housing Resilience  ADL's:  Impaired  Cognition: Impaired,  Moderate  Sleep:      Treatment Plan Summary: Daily contact with patient to assess and evaluate symptoms and progress in treatment, Medication management and Plan Filed IVC. Patient with poor judgement and impulsive dangerous behavior. Depressed. Attempted suicide. Needs geropsych after medically stable. Meanwhile needs social investigation of home situation  Disposition: Recommend psychiatric Inpatient admission when medically cleared. Supportive therapy provided about ongoing stressors.  Alethia Berthold, MD 06/22/2015 8:19 PM

## 2015-06-23 LAB — TROPONIN I
TROPONIN I: 0.08 ng/mL — AB (ref <0.031)
Troponin I: 0.08 ng/mL — ABNORMAL HIGH (ref <0.031)

## 2015-06-23 MED ORDER — TAMSULOSIN HCL 0.4 MG PO CAPS
0.4000 mg | ORAL_CAPSULE | Freq: Every morning | ORAL | Status: DC
  Administered 2015-06-23 – 2015-06-26 (×4): 0.4 mg via ORAL
  Filled 2015-06-23 (×4): qty 1

## 2015-06-23 MED ORDER — POTASSIUM CHLORIDE CRYS ER 20 MEQ PO TBCR
20.0000 meq | EXTENDED_RELEASE_TABLET | Freq: Two times a day (BID) | ORAL | Status: DC
  Administered 2015-06-23 – 2015-06-26 (×7): 20 meq via ORAL
  Filled 2015-06-23 (×7): qty 1

## 2015-06-23 MED ORDER — SERTRALINE HCL 100 MG PO TABS
100.0000 mg | ORAL_TABLET | Freq: Every day | ORAL | Status: DC
  Administered 2015-06-23 – 2015-06-26 (×4): 100 mg via ORAL
  Filled 2015-06-23 (×4): qty 1

## 2015-06-23 MED ORDER — ASPIRIN 81 MG PO CHEW
81.0000 mg | CHEWABLE_TABLET | Freq: Every day | ORAL | Status: DC
  Administered 2015-06-23 – 2015-06-26 (×4): 81 mg via ORAL
  Filled 2015-06-23 (×4): qty 1

## 2015-06-23 MED ORDER — LISINOPRIL 5 MG PO TABS
2.5000 mg | ORAL_TABLET | Freq: Every day | ORAL | Status: DC
  Administered 2015-06-23 – 2015-06-25 (×3): 2.5 mg via ORAL
  Administered 2015-06-26: 5 mg via ORAL
  Filled 2015-06-23: qty 1
  Filled 2015-06-23: qty 2
  Filled 2015-06-23 (×2): qty 1

## 2015-06-23 MED ORDER — TRAZODONE HCL 100 MG PO TABS
200.0000 mg | ORAL_TABLET | Freq: Every day | ORAL | Status: DC
  Administered 2015-06-23 – 2015-06-25 (×3): 200 mg via ORAL
  Filled 2015-06-23 (×3): qty 2

## 2015-06-23 MED ORDER — DIGOXIN 125 MCG PO TABS
0.2500 mg | ORAL_TABLET | Freq: Every day | ORAL | Status: DC
  Administered 2015-06-23 – 2015-06-26 (×4): 0.25 mg via ORAL
  Filled 2015-06-23 (×4): qty 2

## 2015-06-23 MED ORDER — BUPROPION HCL ER (XL) 150 MG PO TB24
150.0000 mg | ORAL_TABLET | Freq: Every day | ORAL | Status: DC
  Administered 2015-06-23 – 2015-06-26 (×4): 150 mg via ORAL
  Filled 2015-06-23 (×4): qty 1

## 2015-06-23 MED ORDER — FUROSEMIDE 40 MG PO TABS
20.0000 mg | ORAL_TABLET | Freq: Every day | ORAL | Status: DC
  Administered 2015-06-23 – 2015-06-26 (×4): 20 mg via ORAL
  Filled 2015-06-23 (×4): qty 1

## 2015-06-23 MED ORDER — FERROUS SULFATE 325 (65 FE) MG PO TABS
325.0000 mg | ORAL_TABLET | Freq: Two times a day (BID) | ORAL | Status: DC
  Administered 2015-06-23 – 2015-06-26 (×6): 325 mg via ORAL
  Filled 2015-06-23 (×7): qty 1

## 2015-06-23 MED ORDER — METFORMIN HCL 500 MG PO TABS
1000.0000 mg | ORAL_TABLET | Freq: Two times a day (BID) | ORAL | Status: DC
  Administered 2015-06-23 – 2015-06-26 (×6): 1000 mg via ORAL
  Filled 2015-06-23 (×7): qty 2

## 2015-06-23 MED ORDER — PANTOPRAZOLE SODIUM 40 MG PO TBEC
40.0000 mg | DELAYED_RELEASE_TABLET | Freq: Every day | ORAL | Status: DC
  Administered 2015-06-23 – 2015-06-26 (×4): 40 mg via ORAL
  Filled 2015-06-23 (×4): qty 1

## 2015-06-23 MED ORDER — POLYETHYLENE GLYCOL 3350 17 G PO PACK: 17.0000 g | PACK | Freq: Every day | ORAL | Status: DC | PRN

## 2015-06-23 MED ORDER — GABAPENTIN 600 MG PO TABS
600.0000 mg | ORAL_TABLET | Freq: Three times a day (TID) | ORAL | Status: DC
  Administered 2015-06-23 – 2015-06-26 (×9): 600 mg via ORAL
  Filled 2015-06-23 (×10): qty 1

## 2015-06-23 MED ORDER — ACETAMINOPHEN 500 MG PO TABS
500.0000 mg | ORAL_TABLET | Freq: Three times a day (TID) | ORAL | Status: DC
  Administered 2015-06-23 – 2015-06-26 (×9): 500 mg via ORAL
  Filled 2015-06-23 (×10): qty 1

## 2015-06-23 MED ORDER — MECLIZINE HCL 25 MG PO TABS
25.0000 mg | ORAL_TABLET | Freq: Two times a day (BID) | ORAL | Status: DC
  Administered 2015-06-23 – 2015-06-26 (×7): 25 mg via ORAL
  Filled 2015-06-23 (×7): qty 1

## 2015-06-23 MED ORDER — IBUPROFEN 800 MG PO TABS
800.0000 mg | ORAL_TABLET | Freq: Once | ORAL | Status: AC
  Administered 2015-06-23: 800 mg via ORAL
  Filled 2015-06-23: qty 1

## 2015-06-23 MED ORDER — FINASTERIDE 5 MG PO TABS
5.0000 mg | ORAL_TABLET | Freq: Every day | ORAL | Status: DC
  Administered 2015-06-23 – 2015-06-26 (×4): 5 mg via ORAL
  Filled 2015-06-23 (×4): qty 1

## 2015-06-23 MED ORDER — CARVEDILOL 6.25 MG PO TABS
3.1250 mg | ORAL_TABLET | Freq: Two times a day (BID) | ORAL | Status: DC
  Administered 2015-06-23 – 2015-06-26 (×6): 3.125 mg via ORAL
  Filled 2015-06-23 (×6): qty 1

## 2015-06-23 NOTE — ED Notes (Signed)
Pt watching tv.  Pt calm and cooperative.  

## 2015-06-23 NOTE — ED Notes (Signed)
Pt wife Jakaiden Jabs telephone number [phone].

## 2015-06-23 NOTE — ED Notes (Addendum)
meds given.  Pt watching tv.  Room cooler.  Pt has shirt on now.  Calm and coopertive.

## 2015-06-23 NOTE — ED Provider Notes (Signed)
-----------------------------------------   215 AM on 06/23/2015 -----------------------------------------   Blood pressure 175/96, pulse 88, temperature 97.7 F (36.5 C), temperature source Oral, resp. rate 18, height 6\' 2"  (1.88 m), weight 233 lb (105.688 kg), SpO2 99 %.  Patient complaining of left-sided aching chest pain radiating into his left arm.  ED ECG REPORT I, Arelia Longest, the attending physician, personally viewed and interpreted this ECG.   Date: 06/23/2015  EKG Time: 0203  Rate: 75  Rhythm: Ventricular pacing  Axis: Normal  Intervals:Wide complex consistent with ventricular pacing.  ST&T Change: No ST elevation or depression. PVC times one. T-wave inversion in lead aVL which is unchanged from previous. No significant change from previous EKG done on 06/22/2015.  ----------------------------------------- 3:13 AM on 06/23/2015 -----------------------------------------  Patient's chest pain is now gone. Mildly elevated troponin which appears to be close to his baseline. We'll repeat troponin at 0 600. Patient says that he intermittently gets chest pains similar to this when anxious and this may be the cause. Reassuring EKG.    Myrna Blazer, MD 06/23/15 [phone]

## 2015-06-23 NOTE — ED Notes (Signed)
Lunch provided to pt

## 2015-06-23 NOTE — ED Notes (Signed)
Troponin 0.08, MD notified. Will draw repeat at 0600

## 2015-06-23 NOTE — ED Notes (Signed)
Resumed care from [place] rn.  Pt alert .  Pt has shirt off stating he is hot and room is hot.  It is and nurse working on getting room cooler.  Pt denies Si or HI.  Pt calm and cooperative.  Pt has a walker in room with him.

## 2015-06-23 NOTE — ED Notes (Signed)
Pt c/o CP and left arm pain. MD notified, verbal orders given for EKG and troponin draw.

## 2015-06-24 NOTE — ED Notes (Signed)

## 2015-06-24 NOTE — BHH Counselor (Signed)
Pt has been accepted to [facility] for inpatient Tx per Delorise Shiner [facility] staff).  They will transport pt in the A.M. per Delorise Shiner.  Number to call for report: [phone]

## 2015-06-24 NOTE — ED Provider Notes (Signed)
-----------------------------------------   6:50 AM on 06/24/2015 -----------------------------------------   BP 137/88 mmHg  Pulse 84  Temp(Src) 98.7 F (37.1 C) (Oral)  Resp 18  Ht 6\' 2"  (1.88 m)  Wt 233 lb (105.688 kg)  BMI 29.90 kg/m2  SpO2 99%  No acute events since last update.  No concerning behavior. Awaiting geropsych placement.      Phineas Semen, MD 06/24/15 [phone]

## 2015-06-24 NOTE — BHH Counselor (Signed)
Per Dr. Toni Amend - pt meets inpatient criteria ... Pt faxed to Geriatric Psychiatric Placement:  - [facility] [phone])  [place] [phone])  [facility] [phone])  [facility] [phone])  - [facility] [phone])  - [place] [phone])  [facility] [phone])

## 2015-06-24 NOTE — BHH Counselor (Signed)
This Clinical research associate spoke directly with [facility] who reports possible acceptance pending Chest X-Rays and EKG.  This Clinical research associate faxed Chest X-Rays and EKG to [place] [phone])

## 2015-06-24 NOTE — BHH Counselor (Signed)
This Clinical research associate spoke directly with Nicolas Morris who reports possible acceptance pending updated clinical notes and MAR.

## 2015-06-24 NOTE — BHH Counselor (Signed)
Per Dr. Toni Amend - pt meets inpatient criteria ... Pt faxed to Geriatric Psychiatric Placement:  - [facility] [phone])  - [facility] [phone])  - [place] [phone])  - [address] Franky Macho [phone])  - [facility] [phone])  [facility] [phone])

## 2015-06-24 NOTE — ED Notes (Signed)
Pt awaiting placement for geripsych.

## 2015-06-24 NOTE — BHH Counselor (Signed)
This Clinical research associate spoke with Nicolas Morris [facility]) who reports pt has been declined by medical doctor - "too medically complex"

## 2015-06-24 NOTE — ED Notes (Signed)
Report recvd from [place], [place]

## 2015-06-25 MED ORDER — CARVEDILOL 6.25 MG PO TABS
ORAL_TABLET | ORAL | Status: AC
  Administered 2015-06-25: 3.125 mg via ORAL
  Filled 2015-06-25: qty 1

## 2015-06-25 NOTE — ED Notes (Signed)
BEHAVIORAL HEALTH ROUNDING Patient sleeping: Yes.   Patient alert and oriented: not applicable SLEEPING Behavior appropriate: Yes.  ; If no, describe: SLEEPING Nutrition and fluids offered: No SLEEPING Toileting and hygiene offered: NoSLEEPING Sitter present: not applicable, Q 15 min safety rounds and observation. Law enforcement present: Yes ODS 

## 2015-06-25 NOTE — ED Notes (Signed)

## 2015-06-25 NOTE — ED Notes (Signed)
Pt resting in bed, eyes closed, resp even and unlabored

## 2015-06-25 NOTE — ED Provider Notes (Signed)
-----------------------------------------   7:04 AM on 06/25/2015 -----------------------------------------   Blood pressure 142/81, pulse 78, temperature 97.4 F (36.3 C), temperature source Oral, resp. rate 15, height 6\' 2"  (1.88 m), weight 233 lb (105.688 kg), SpO2 93 %.  The patient had no acute events since last update.  Calm and cooperative at this time.    The patient has been accepted to [facility] and will be picked up in the morning today.     Rebecka Apley, MD 06/25/15 [phone]

## 2015-06-25 NOTE — ED Notes (Addendum)
BEHAVIORAL HEALTH ROUNDING Patient sleeping: Yes.   Patient alert and oriented: not applicable SLEEPING Behavior appropriate: Yes.  ; If no, describe: SLEEPING Nutrition and fluids offered: No SLEEPING Toileting and hygiene offered: NoSLEEPING Sitter present: not applicable, Q 15 min safety rounds and observation. Law enforcement present: Yes ODS 

## 2015-06-25 NOTE — BHH Counselor (Signed)
Call received from [facility] with [facility] reporting that their facility would not be able to accept pt today but would most likely be able to take him tomorrow.

## 2015-06-25 NOTE — ED Notes (Signed)
Pt sitting up in bed, resp even and unlabored, eating breakast

## 2015-06-25 NOTE — ED Notes (Signed)
BEHAVIORAL HEALTH ROUNDING Patient sleeping: Yes.   Patient alert and oriented: not applicable SLEEPING Behavior appropriate: Yes.  ; If no, describe: SLEEPING Nutrition and fluids offered: No SLEEPING Toileting and hygiene offered: NoSLEEPING Sitter present: not applicable, Q 15 min safety rounds and observation. Law enforcement present: Yes ODS  ENVIRONMENTAL ASSESSMENT  Potentially harmful objects out of patient reach: Yes.  Personal belongings secured: Yes.  Patient dressed in hospital provided attire only: Yes.  Plastic bags out of patient reach: Yes.  Patient care equipment (cords, cables, call bells, lines, and drains) shortened, removed, or accounted for: Yes.  Equipment and supplies removed from bottom of stretcher: Yes.  Potentially toxic materials out of patient reach: Yes.  Sharps container removed or out of patient reach: Yes.   

## 2015-06-26 NOTE — ED Notes (Signed)
Patient observed lying in bed with eyes closed  Even, unlabored respirations observed   NAD pt appears to be sleeping  I will continue to monitor along with every 15 minute visual observations and ongoing security camera monitoring    

## 2015-06-26 NOTE — ED Notes (Addendum)
He has been accepted to [facility]  Dr Yetta Barre   Pt may transfer at any time  [facility] representative [phone]   Reported to me by [facility]

## 2015-06-26 NOTE — ED Notes (Signed)
He has used the telephone - he called his spouse to inform her of his pending move to [place]  I will attempt to call in case she has any questions

## 2015-06-26 NOTE — ED Notes (Signed)
I have called back to [facility] and given report to [facility] - pt to transfer with [employer] officer at this time

## 2015-06-26 NOTE — ED Notes (Addendum)

## 2015-06-26 NOTE — ED Notes (Signed)
ED BHU PLACEMENT JUSTIFICATION Is the patient under IVC or is there intent for IVC: Yes.   Is the patient medically cleared: Yes.   Is there vacancy in the ED BHU: Yes.   Is the population mix appropriate for patient:  geriatric Is the patient awaiting placement in inpatient or outpatient setting: Yes. inpt geriatric psych placement    Has the patient had a psychiatric consult: Yes.   Survey of unit performed for contraband, proper placement and condition of furniture, tampering with fixtures in bathroom, shower, and each patient room: Yes.  ; Findings:  APPEARANCE/BEHAVIOR Calm and cooperative NEURO ASSESSMENT Orientation: oriented x4  Denies pain Hallucinations: No.None noted (Hallucinations) Speech: Normal Gait:  Steady- rolling walker used at times  RESPIRATORY ASSESSMENT Even  Unlabored respirations  CARDIOVASCULAR ASSESSMENT Pulses equal   regular rate  Skin warm and dry   GASTROINTESTINAL ASSESSMENT no GI complaint EXTREMITIES Full ROM  PLAN OF CARE Provide calm/safe environment. Vital signs assessed twice daily. ED BHU Assessment once each 12-hour shift. Collaborate with intake RN daily or as condition indicates. Assure the ED provider has rounded once each shift. Provide and encourage hygiene. Provide redirection as needed. Assess for escalating behavior; address immediately and inform ED provider.  Assess family dynamic and appropriateness for visitation as needed: Yes.  ; If necessary, describe findings:  Educate the patient/family about BHU procedures/visitation: Yes.  ; If necessary, describe findings:

## 2015-06-26 NOTE — ED Provider Notes (Signed)
Accepted to [facility].  Governor Rooks, MD 06/26/15 [phone]

## 2015-06-26 NOTE — ED Provider Notes (Signed)
-----------------------------------------   7:30 AM on 06/26/2015 -----------------------------------------   Blood pressure 159/77, pulse 62, temperature 98.7 F (37.1 C), temperature source Oral, resp. rate 15, height 6\' 2"  (1.88 m), weight 233 lb (105.688 kg), SpO2 99 %.  The patient had no acute events since last update.  Calm and cooperative at this time.    Patient to be accepted and transferred to [facility] today     Rebecka Apley, MD 06/26/15 [phone]

## 2015-06-26 NOTE — ED Notes (Signed)
BEHAVIORAL HEALTH ROUNDING Patient sleeping: No. Patient alert and oriented: yes Behavior appropriate: Yes.  ; If no, describe:  Nutrition and fluids offered: yes Toileting and hygiene offered: Yes  Sitter present: q15 minute observations and security  monitoring Law enforcement present: Yes  ODS  

## 2015-08-08 ENCOUNTER — Other Ambulatory Visit: Payer: Self-pay

## 2015-08-08 ENCOUNTER — Emergency Department: Payer: Medicare Other

## 2015-08-08 ENCOUNTER — Emergency Department
Admission: EM | Admit: 2015-08-08 | Discharge: 2015-08-08 | Disposition: A | Discharge status: Home / Self Care | Payer: Medicare Other | Attending: Emergency Medicine | Admitting: Emergency Medicine

## 2015-08-08 DIAGNOSIS — Z7984 Long term (current) use of oral hypoglycemic drugs: Secondary | ICD-10-CM | POA: Diagnosis not present

## 2015-08-08 DIAGNOSIS — M545 Low back pain: Secondary | ICD-10-CM | POA: Insufficient documentation

## 2015-08-08 DIAGNOSIS — W19XXXA Unspecified fall, initial encounter: Secondary | ICD-10-CM

## 2015-08-08 DIAGNOSIS — Z79899 Other long term (current) drug therapy: Secondary | ICD-10-CM | POA: Diagnosis not present

## 2015-08-08 DIAGNOSIS — I110 Hypertensive heart disease with heart failure: Secondary | ICD-10-CM | POA: Diagnosis not present

## 2015-08-08 DIAGNOSIS — E119 Type 2 diabetes mellitus without complications: Secondary | ICD-10-CM | POA: Insufficient documentation

## 2015-08-08 DIAGNOSIS — G8929 Other chronic pain: Secondary | ICD-10-CM | POA: Insufficient documentation

## 2015-08-08 DIAGNOSIS — R296 Repeated falls: Secondary | ICD-10-CM | POA: Insufficient documentation

## 2015-08-08 DIAGNOSIS — Z87891 Personal history of nicotine dependence: Secondary | ICD-10-CM | POA: Insufficient documentation

## 2015-08-08 DIAGNOSIS — I5042 Chronic combined systolic (congestive) and diastolic (congestive) heart failure: Secondary | ICD-10-CM | POA: Insufficient documentation

## 2015-08-08 DIAGNOSIS — I429 Cardiomyopathy, unspecified: Secondary | ICD-10-CM | POA: Insufficient documentation

## 2015-08-08 DIAGNOSIS — Z7982 Long term (current) use of aspirin: Secondary | ICD-10-CM | POA: Diagnosis not present

## 2015-08-08 DIAGNOSIS — I4891 Unspecified atrial fibrillation: Secondary | ICD-10-CM | POA: Insufficient documentation

## 2015-08-08 LAB — COMPREHENSIVE METABOLIC PANEL
ALBUMIN: 4 g/dL (ref 3.5–5.0)
ALT: 15 U/L — AB (ref 17–63)
AST: 22 U/L (ref 15–41)
Alkaline Phosphatase: 82 U/L (ref 38–126)
Anion gap: 7 (ref 5–15)
BUN: 16 mg/dL (ref 6–20)
CHLORIDE: 105 mmol/L (ref 101–111)
CO2: 26 mmol/L (ref 22–32)
CREATININE: 0.85 mg/dL (ref 0.61–1.24)
Calcium: 9.5 mg/dL (ref 8.9–10.3)
GFR calc Af Amer: >60 mL/min (ref >60)
GLUCOSE: 105 mg/dL — AB (ref 65–99)
Potassium: 4.4 mmol/L (ref 3.5–5.1)
Sodium: 138 mmol/L (ref 135–145)
Total Bilirubin: 0.7 mg/dL (ref 0.3–1.2)
Total Protein: 7.5 g/dL (ref 6.5–8.1)

## 2015-08-08 LAB — CBC WITH DIFFERENTIAL/PLATELET
Basophils Absolute: 0.3 10*3/uL — ABNORMAL HIGH (ref 0–0.1)
Basophils Relative: 3 %
EOS ABS: 0.1 10*3/uL (ref 0–0.7)
EOS PCT: 1 %
HCT: 41.8 % (ref 40.0–52.0)
Hemoglobin: 14.1 g/dL (ref 13.0–18.0)
LYMPHS ABS: 1 10*3/uL (ref 1.0–3.6)
LYMPHS PCT: 11 %
MCH: 26.7 pg (ref 26.0–34.0)
MCHC: 33.7 g/dL (ref 32.0–36.0)
MCV: 79.3 fL — AB (ref 80.0–100.0)
MONO ABS: 0.7 10*3/uL (ref 0.2–1.0)
MONOS PCT: 7 %
Neutro Abs: 7.4 10*3/uL — ABNORMAL HIGH (ref 1.4–6.5)
Neutrophils Relative %: 78 %
PLATELETS: 221 10*3/uL (ref 150–440)
RBC: 5.28 MIL/uL (ref 4.40–5.90)
RDW: 16 % — AB (ref 11.5–14.5)
WBC: 9.4 10*3/uL (ref 3.8–10.6)

## 2015-08-08 LAB — TROPONIN I: TROPONIN I: 0.03 ng/mL (ref <0.031)

## 2015-08-08 LAB — ETHANOL: Alcohol, Ethyl (B): <5 mg/dL (ref <5)

## 2015-08-08 NOTE — ED Notes (Signed)
Spoke with Darl Pikes, the pt wife, and advised her that the pt was close to being discharged - she will be on the way to pick him up soon

## 2015-08-08 NOTE — ED Notes (Signed)
Pt arrived via ems with c/o fall - per ems pt reports back pain but he has a history of back pain and spinal stenosis - ems reports that they have been going to his home to pick him up every other day for the last few weeks for falls and the family wants him checked - pt denies any pain at this time

## 2015-08-08 NOTE — Discharge Instructions (Signed)
Fall Prevention in Hospitals, Adult

## 2015-08-08 NOTE — ED Provider Notes (Signed)
Time Seen: Approximately 0 100  I have reviewed the triage notes  Chief Complaint: Fall   History of Present Illness: Nicolas Morris is a 82 y.o. male who apparently is been having some frequent falls. Patient history is that EMS has been called out there on frequent occasions to help the patient up off the floor. The family stated on this particular fall a like to have him "" checked out "". Patient denies any significant acute injury from his fall. He denies any new pain. Apparently has history of spinal stenosis and has chronic right lower back pain. He states some discomfort that goes into port in his right hip region. Patient denies any head trauma. He is not sure exactly why he is falling he states "" I guess I am clumsy"".   Past Medical History  Diagnosis Date  . Non-obstructive CAD     a. s/p multiple caths @ [facility] - last reportedly 06/2012;  b. 06/2013 Lexi MV: EF 51%, no ischemia/infarct.  Marland Kitchen NICM (nonischemic cardiomyopathy) (HCC)     a. EF reportedly < 30% 01/2013 ([facility]);  b. 01/2012 s/p MDT DTBB1D1 Viva S CRT-D;  c. 12/2012 Echo: EF 45-50%, inf/post HK, nl RV, mild MR.  . Diabetes mellitus without complication (HCC)   . A-fib (HCC)     a. Permanent, on Xarelto.  . H/O: GI bleed     a. 12/2012 EGD: HH, evidence of reflux. Erosive Gastritis. Nl duodenum;  b. 12/2012 Colonoscopy: Sev sigmoid and desc colon diverticulosis, transverse and ascending colon diverticulosis, nonbleeding internal hemorrhoids, no bleeding.  . Iron deficiency anemia   . Biventricular ICD (implantable cardioverter-defibrillator) -Medtronic 01/12/2013    a. 01/2012 [facility]:  01/2012 s/p MDT DTBB1D1 Viva S CRT-D.  Atrial port capped (afib).  . Diverticulosis     a. 12/2012 noted on colonoscopy.  . Hiatal hernia     a. 12/2012 noted on EGD.  Marland Kitchen Gastritis     a. 12/2012 noted on EGD.  Marland Kitchen Hypertension   . AICD (automatic cardioverter/defibrillator) present   . Presence of permanent cardiac pacemaker      Patient Active Problem List   Diagnosis Date Noted  . Overdose 06/22/2015  . Dementia, senile with depression 06/22/2015  . Iron deficiency anemia 10/24/2014  . GIB (gastrointestinal bleeding) 08/30/2014  . Hyponatremia 08/30/2014  . Elevated troponin 08/30/2014  . External hemorrhoids 08/30/2014  . Diverticulosis of colon without hemorrhage 08/30/2014  . Gastritis and gastroduodenitis 08/30/2014  . Acute posthemorrhagic anemia 08/25/2014  . Unsteady gait 03/04/2014  . NICM (nonischemic cardiomyopathy) (HCC) 07/20/2013  . Diabetes mellitus without complication (HCC)   . Diverticulosis   . Hiatal hernia   . Non-obstructive CAD   . Neck pain 04/21/2013  . Depression 01/26/2013  . Atrial fibrillation-permanent 01/12/2013  . Biventricular ICD (implantable cardioverter-defibrillator) -Medtronic 01/12/2013  . Congestive dilated cardiomyopathy (HCC) 01/12/2013  . GI bleed 01/12/2013  . Chronic combined systolic and diastolic CHF (congestive heart failure) (HCC) 01/12/2013  . Biventricular ICD (implantable cardioverter-defibrillator) -Medtronic 01/12/2013    Past Surgical History  Procedure Laterality Date  . Cardiac catheterization  2013    [facility]   . Cardiac catheterization  2011  . Defib implant  01/27/2012    Serial # [id number] H Model # [id number]  . Pacemaker insertion  01/27/2012  . Colonoscopy  [address]014  . Upper gastrointestinal endoscopy  2014  . Fetal blood transfusion  2014  . Cardiac defibrillator placement    . Esophagogastroduodenoscopy (egd) with propofol  N/A 08/29/2014    Procedure: ESOPHAGOGASTRODUODENOSCOPY (EGD) WITH PROPOFOL;  Surgeon: Elnita Maxwell, MD;  Location: [facility] ENDOSCOPY;  Service: Endoscopy;  Laterality: N/A;  . Colonoscopy with propofol N/A 08/29/2014    Procedure: COLONOSCOPY WITH PROPOFOL;  Surgeon: Elnita Maxwell, MD;  Location: [facility] ENDOSCOPY;  Service: Endoscopy;  Laterality: N/A;    Past Surgical History  Procedure Laterality Date  .  Cardiac catheterization  2013    [facility]   . Cardiac catheterization  2011  . Defib implant  01/27/2012    Serial # [id number] H Model # [id number]  . Pacemaker insertion  01/27/2012  . Colonoscopy  [address]014  . Upper gastrointestinal endoscopy  2014  . Fetal blood transfusion  2014  . Cardiac defibrillator placement    . Esophagogastroduodenoscopy (egd) with propofol N/A 08/29/2014    Procedure: ESOPHAGOGASTRODUODENOSCOPY (EGD) WITH PROPOFOL;  Surgeon: Elnita Maxwell, MD;  Location: [facility] ENDOSCOPY;  Service: Endoscopy;  Laterality: N/A;  . Colonoscopy with propofol N/A 08/29/2014    Procedure: COLONOSCOPY WITH PROPOFOL;  Surgeon: Elnita Maxwell, MD;  Location: [facility] ENDOSCOPY;  Service: Endoscopy;  Laterality: N/A;    Current Outpatient Rx  Name  Route  Sig  Dispense  Refill  . acetaminophen (TYLENOL) 500 MG tablet   Oral   Take 500 mg by mouth 3 (three) times daily.         Marland Kitchen aspirin 81 MG chewable tablet   Oral   Chew by mouth daily.         Marland Kitchen buPROPion (WELLBUTRIN XL) 150 MG 24 hr tablet   Oral   Take 150 mg by mouth daily.          . carvedilol (COREG) 3.125 MG tablet   Oral   Take 3.125 mg by mouth 2 (two) times daily with a meal.         . clotrimazole-betamethasone (LOTRISONE) cream   Topical   Apply 1 application topically 2 (two) times daily.         . digoxin (LANOXIN) 0.25 MG tablet   Oral   Take 0.25 mg by mouth daily.         . ferrous sulfate 325 (65 FE) MG tablet   Oral   Take 1 tablet (325 mg total) by mouth 2 (two) times daily with a meal.   60 tablet   3   . finasteride (PROSCAR) 5 MG tablet   Oral   Take 5 mg by mouth daily.         . furosemide (LASIX) 20 MG tablet   Oral   Take 20 mg by mouth daily.         Marland Kitchen gabapentin (NEURONTIN) 600 MG tablet   Oral   Take 600 mg by mouth 3 (three) times daily.         Marland Kitchen lisinopril (PRINIVIL,ZESTRIL) 2.5 MG tablet   Oral   Take 2.5 mg by mouth daily.         Marland Kitchen lovastatin (MEVACOR)  20 MG tablet   Oral   Take 20 mg by mouth at bedtime.         . meclizine (ANTIVERT) 25 MG tablet   Oral   Take 25 mg by mouth 2 (two) times daily.         . Melatonin 1 MG TABS   Oral   Take 1 mg by mouth at bedtime.         . metFORMIN (GLUCOPHAGE) 500 MG tablet   Oral  Take 1,000 mg by mouth 2 (two) times daily with a meal.          . pantoprazole (PROTONIX) 40 MG tablet   Oral   Take 40 mg by mouth daily.          . polyethylene glycol (MIRALAX / GLYCOLAX) packet   Oral   Take 17 g by mouth daily as needed for mild constipation or moderate constipation.         . potassium chloride SA (K-DUR,KLOR-CON) 20 MEQ tablet   Oral   Take 20 mEq by mouth 2 (two) times daily.          . sertraline (ZOLOFT) 100 MG tablet   Oral   Take 100 mg by mouth daily.         . tamsulosin (FLOMAX) 0.4 MG CAPS capsule   Oral   Take 0.4 mg by mouth.         . traMADol (ULTRAM) 50 MG tablet   Oral   Take 1 tablet (50 mg total) by mouth every 6 (six) hours as needed.   15 tablet   0   . traZODone (DESYREL) 100 MG tablet   Oral   Take 200 mg by mouth at bedtime.           Allergies:  Review of patient's allergies indicates no known allergies.  Family History: Family History  Problem Relation Age of Onset  . Family history unknown: Yes    Social History: Social History  Substance Use Topics  . Smoking status: Former Smoker -- 3.00 packs/day for 45 years    Types: Cigarettes    Quit date: 01/13/2003  . Smokeless tobacco: None  . Alcohol Use: No     Review of Systems:   10 point review of systems was performed and was otherwise negative:  Constitutional: No fever Eyes: No visual disturbances ENT: No sore throat, ear pain Cardiac: No chest pain Respiratory: No shortness of breath, wheezing, or stridor Abdomen: No abdominal pain, no vomiting, No diarrhea Endocrine: No weight loss, No night sweats Extremities: No peripheral edema, cyanosis Skin:  No rashes, easy bruising Neurologic: No focal weakness, trouble with speech or swollowing Urologic: No dysuria, Hematuria, or urinary frequency   Physical Exam:  ED Triage Vitals  Enc Vitals Group     BP 08/08/15 0029 150/80 mmHg     Pulse Rate 08/08/15 0029 86     Resp 08/08/15 0029 16     Temp 08/08/15 0029 98 F (36.7 C)     Temp Source 08/08/15 0029 Oral     SpO2 08/08/15 0029 100 %     Weight 08/08/15 0029 233 lb (105.688 kg)     Height 08/08/15 0029  (1.88 m)     Head Cir --      Peak Flow --      Pain Score 08/08/15 0030 0     Pain Loc --      Pain Edu? --      Excl. in GC? --     Labs:   All laboratory work was reviewed including any pertinent negatives or positives listed below:  Labs Reviewed  CBC WITH DIFFERENTIAL/PLATELET - Abnormal; Notable for the following:    MCV 79.3 (*)    RDW 16.0 (*)    Neutro Abs 7.4 (*)    Basophils Absolute 0.3 (*)    All other components within normal limits  COMPREHENSIVE METABOLIC PANEL  TROPONIN I    Final  Clinical Impression:   Final diagnoses:  None     Past Medical History  Diagnosis Date  . Non-obstructive CAD     a. s/p multiple caths @ [facility] - last reportedly 06/2012;  b. 06/2013 Lexi MV: EF 51%, no ischemia/infarct.  Marland Kitchen NICM (nonischemic cardiomyopathy) (HCC)     a. EF reportedly < 30% 01/2013 ([facility]);  b. 01/2012 s/p MDT DTBB1D1 Viva S CRT-D;  c. 12/2012 Echo: EF 45-50%, inf/post HK, nl RV, mild MR.  . Diabetes mellitus without complication (HCC)   . A-fib (HCC)     a. Permanent, on Xarelto.  . H/O: GI bleed     a. 12/2012 EGD: HH, evidence of reflux. Erosive Gastritis. Nl duodenum;  b. 12/2012 Colonoscopy: Sev sigmoid and desc colon diverticulosis, transverse and ascending colon diverticulosis, nonbleeding internal hemorrhoids, no bleeding.  . Iron deficiency anemia   . Biventricular ICD (implantable cardioverter-defibrillator) -Medtronic 01/12/2013    a. 01/2012 [facility]:  01/2012 s/p MDT DTBB1D1  Viva S CRT-D.  Atrial port capped (afib).  . Diverticulosis     a. 12/2012 noted on colonoscopy.  . Hiatal hernia     a. 12/2012 noted on EGD.  Marland Kitchen Gastritis     a. 12/2012 noted on EGD.  Marland Kitchen Hypertension   . AICD (automatic cardioverter/defibrillator) present   . Presence of permanent cardiac pacemaker     Patient Active Problem List   Diagnosis Date Noted  . Overdose 06/22/2015  . Dementia, senile with depression 06/22/2015  . Iron deficiency anemia 10/24/2014  . GIB (gastrointestinal bleeding) 08/30/2014  . Hyponatremia 08/30/2014  . Elevated troponin 08/30/2014  . External hemorrhoids 08/30/2014  . Diverticulosis of colon without hemorrhage 08/30/2014  . Gastritis and gastroduodenitis 08/30/2014  . Acute posthemorrhagic anemia 08/25/2014  . Unsteady gait 03/04/2014  . NICM (nonischemic cardiomyopathy) (HCC) 07/20/2013  . Diabetes mellitus without complication (HCC)   . Diverticulosis   . Hiatal hernia   . Non-obstructive CAD   . Neck pain 04/21/2013  . Depression 01/26/2013  . Atrial fibrillation-permanent 01/12/2013  . Biventricular ICD (implantable cardioverter-defibrillator) -Medtronic 01/12/2013  . Congestive dilated cardiomyopathy (HCC) 01/12/2013  . GI bleed 01/12/2013  . Chronic combined systolic and diastolic CHF (congestive heart failure) (HCC) 01/12/2013  . Biventricular ICD (implantable cardioverter-defibrillator) -Medtronic 01/12/2013    Past Surgical History  Procedure Laterality Date  . Cardiac catheterization  2013    [facility]   . Cardiac catheterization  2011  . Defib implant  01/27/2012    Serial # [id number] H Model # [id number]  . Pacemaker insertion  01/27/2012  . Colonoscopy  [address]  . Upper gastrointestinal endoscopy  2014  . Fetal blood transfusion  2014  . Cardiac defibrillator placement    . Esophagogastroduodenoscopy (egd) with propofol N/A 08/29/2014    Procedure: ESOPHAGOGASTRODUODENOSCOPY (EGD) WITH PROPOFOL;  Surgeon: Elnita Maxwell, MD;   Location: [facility] ENDOSCOPY;  Service: Endoscopy;  Laterality: N/A;  . Colonoscopy with propofol N/A 08/29/2014    Procedure: COLONOSCOPY WITH PROPOFOL;  Surgeon: Elnita Maxwell, MD;  Location: [facility] ENDOSCOPY;  Service: Endoscopy;  Laterality: N/A;    Past Surgical History  Procedure Laterality Date  . Cardiac catheterization  2013    [facility]   . Cardiac catheterization  2011  . Defib implant  01/27/2012    Serial # [id number] H Model # [id number]  . Pacemaker insertion  01/27/2012  . Colonoscopy  [address]  . Upper gastrointestinal endoscopy  2014  . Fetal blood transfusion  2014  . Cardiac  defibrillator placement    . Esophagogastroduodenoscopy (egd) with propofol N/A 08/29/2014    Procedure: ESOPHAGOGASTRODUODENOSCOPY (EGD) WITH PROPOFOL;  Surgeon: Elnita Maxwell, MD;  Location: [facility] ENDOSCOPY;  Service: Endoscopy;  Laterality: N/A;  . Colonoscopy with propofol N/A 08/29/2014    Procedure: COLONOSCOPY WITH PROPOFOL;  Surgeon: Elnita Maxwell, MD;  Location: [facility] ENDOSCOPY;  Service: Endoscopy;  Laterality: N/A;    Current Outpatient Rx  Name  Route  Sig  Dispense  Refill  . acetaminophen (TYLENOL) 500 MG tablet   Oral   Take 500 mg by mouth 3 (three) times daily.         Marland Kitchen aspirin 81 MG chewable tablet   Oral   Chew by mouth daily.         Marland Kitchen buPROPion (WELLBUTRIN XL) 150 MG 24 hr tablet   Oral   Take 150 mg by mouth daily.          . carvedilol (COREG) 3.125 MG tablet   Oral   Take 3.125 mg by mouth 2 (two) times daily with a meal.         . clotrimazole-betamethasone (LOTRISONE) cream   Topical   Apply 1 application topically 2 (two) times daily.         . digoxin (LANOXIN) 0.25 MG tablet   Oral   Take 0.25 mg by mouth daily.         . ferrous sulfate 325 (65 FE) MG tablet   Oral   Take 1 tablet (325 mg total) by mouth 2 (two) times daily with a meal.   60 tablet   3   . finasteride (PROSCAR) 5 MG tablet   Oral   Take 5 mg by mouth daily.          . furosemide (LASIX) 20 MG tablet   Oral   Take 20 mg by mouth daily.         Marland Kitchen gabapentin (NEURONTIN) 600 MG tablet   Oral   Take 600 mg by mouth 3 (three) times daily.         Marland Kitchen lisinopril (PRINIVIL,ZESTRIL) 2.5 MG tablet   Oral   Take 2.5 mg by mouth daily.         Marland Kitchen lovastatin (MEVACOR) 20 MG tablet   Oral   Take 20 mg by mouth at bedtime.         . meclizine (ANTIVERT) 25 MG tablet   Oral   Take 25 mg by mouth 2 (two) times daily.         . Melatonin 1 MG TABS   Oral   Take 1 mg by mouth at bedtime.         . metFORMIN (GLUCOPHAGE) 500 MG tablet   Oral   Take 1,000 mg by mouth 2 (two) times daily with a meal.          . pantoprazole (PROTONIX) 40 MG tablet   Oral   Take 40 mg by mouth daily.          . polyethylene glycol (MIRALAX / GLYCOLAX) packet   Oral   Take 17 g by mouth daily as needed for mild constipation or moderate constipation.         . potassium chloride SA (K-DUR,KLOR-CON) 20 MEQ tablet   Oral   Take 20 mEq by mouth 2 (two) times daily.          . sertraline (ZOLOFT) 100 MG tablet   Oral   Take 100  mg by mouth daily.         . tamsulosin (FLOMAX) 0.4 MG CAPS capsule   Oral   Take 0.4 mg by mouth.         . traMADol (ULTRAM) 50 MG tablet   Oral   Take 1 tablet (50 mg total) by mouth every 6 (six) hours as needed.   15 tablet   0   . traZODone (DESYREL) 100 MG tablet   Oral   Take 200 mg by mouth at bedtime.           Allergies:  Review of patient's allergies indicates no known allergies.  Family History: Family History  Problem Relation Age of Onset  . Family history unknown: Yes    Social History: Social History  Substance Use Topics  . Smoking status: Former Smoker -- 3.00 packs/day for 45 years    Types: Cigarettes    Quit date: 01/13/2003  . Smokeless tobacco: None  . Alcohol Use: No     General: Awake , Alert , and Oriented times 2, Glasgow Coma Scale 15 is able to follow instructions  Head: Normal cephalic , atraumatic Eyes: Pupils equal , round, reactive to light Nose/Throat: No nasal drainage, patent upper airway without erythema or exudate.  Neck: Supple, Full range of motion, No anterior adenopathy or palpable thyroid masses Lungs: Clear to ascultation without wheezes , rhonchi, or rales Heart: Regular rate, regular rhythm without murmurs , gallops , or rubs Abdomen: Soft, non tender without rebound, guarding , or rigidity; bowel sounds positive and symmetric in all 4 quadrants. No organomegaly .        Extremities: Some tenderness in the right hip area with full range of motion Neurologic: normal ambulation, Motor symmetric without deficits, sensory intact Skin: warm, dry, no rashes No obvious cervical, thoracic, lumbar spine pain.  Labs:   All laboratory work was reviewed including any pertinent negatives or positives listed below:  Labs Reviewed  CBC WITH DIFFERENTIAL/PLATELET - Abnormal; Notable for the following:    MCV 79.3 (*)    RDW 16.0 (*)    Neutro Abs 7.4 (*)    Basophils Absolute 0.3 (*)    All other components within normal limits  COMPREHENSIVE METABOLIC PANEL  TROPONIN I  Laboratory work was reviewed and showed no clinically significant abnormalities.   EKG:  ED ECG REPORT I, Jennye Moccasin, the attending physician, personally viewed and interpreted this ECG.  Date: 08/08/2015 EKG Time: 0028 Rate: 77 Rhythm: Ventricularly paced rhythm with occasional PVC QRS Axis: normal Intervals: normal ST/T Wave abnormalities: normal Conduction Disturbances: none Narrative Interpretation: unremarkable No acute ischemic changes are noted   Radiology:   CT HEAD WO CONTRAST (Final result) Result time: 08/08/15 01:41:54   Final result by Rad Results In Interface (08/08/15 01:41:54)   Narrative:   CLINICAL DATA: 82 year old male with fall  EXAM: CT HEAD WITHOUT CONTRAST  TECHNIQUE: Contiguous axial images were obtained from the base of the  skull through the vertex without intravenous contrast.  COMPARISON: Head CT dated 04/11/2015  FINDINGS: The ventricles and sulci are appropriate in size for patient's age. Minimal periventricular and deep white matter chronic microvascular ischemic changes noted. There is no acute intracranial hemorrhage. No mass effect or midline shift.  There is mild mucoperiosteal thickening of the paranasal sinuses. There is partial opacification of the left maxillary sinus, chronic. No air-fluid level. The mastoid air cells are clear. The calvarium is intact.  IMPRESSION: No acute intracranial hemorrhage.  Electronically Signed By: Elgie Collard M.D. On: 08/08/2015 01:41          DG Chest 2 View (Final result) Result time: 08/08/15 01:34:17   Final result by Rad Results In Interface (08/08/15 01:34:17)   Narrative:   CLINICAL DATA: Seven 69-year-old male with fall on back pain  EXAM: CHEST 2 VIEW  COMPARISON: Chest radiograph dated 06/22/2015  FINDINGS: Two views of the chest do not demonstrate a focal consolidation. There is no pleural effusion or pneumothorax. Stable cardiomegaly. Left pectoral AICD device. Old healed left clavicular fracture. No acute fracture.  IMPRESSION: No active cardiopulmonary disease.   Electronically Signed By: Elgie Collard M.D. On: 08/08/2015 01:34          DG Lumbar Spine 2-3 Views (Final result) Result time: 08/08/15 01:33:31   Final result by Rad Results In Interface (08/08/15 01:33:31)   Narrative:   CLINICAL DATA: Seven 58-year-old male with fall and back pain.  EXAM: LUMBAR SPINE - 2-3 VIEW  COMPARISON: Abdominal CT dated 04/11/2015  FINDINGS: There is no definite acute fracture or subluxation of the lumbar spine. There is osteopenia with multilevel degenerative changes. Multilevel disc desiccation with vacuum phenomena noted. The visualized transverse and spinous processes appear intact.  There  is atherosclerotic calcification of the visualized abdominal aorta. The soft tissues are grossly unremarkable.  IMPRESSION: No acute/traumatic lumbar spine pathology.   Electronically Signed By: Elgie Collard M.D. On: 08/08/2015 01:33          DG HIP UNILAT WITH PELVIS 2-3 VIEWS RIGHT (Final result) Result time: 08/08/15 01:29:39   Final result by Rad Results In Interface (08/08/15 01:29:39)   Narrative:   CLINICAL DATA: 82 year old male with fall and back pain.  EXAM: DG HIP (WITH OR WITHOUT PELVIS) 2-3V RIGHT  COMPARISON: None.  FINDINGS: There is no acute fracture or dislocation of the right hip. There are bilateral osteoarthritic changes of the hips as well as degenerative changes of the visualized lower lumbar spine. The soft tissues appear unremarkable.  IMPRESSION: No acute fracture or dislocation.   Electronically Signed By: Elgie Collard M.D. On: 08/08/2015 01:29             I personally reviewed the radiologic studies     ED Course:  Patient's stay here was uneventful he cannot find any significant injury from his fall. He does not appear to have any associated syncopal episodes and thus far his head CT is normal. Most likely has some dementia and is having difficulty cord coordinating his ambulation, but can be followed up on an outpatient basis.  We are currently awaiting the patient's wife to transport him home.  Assessment:  Fall with no significant injury      Plan: Outpatient Patient was advised to return immediately if condition worsens. Patient was advised to follow up with their primary care physician or other specialized physicians involved in their outpatient care. The patient and/or family member/power of attorney had laboratory results reviewed at the bedside. All questions and concerns were addressed and appropriate discharge instructions were distributed by the nursing staff.            Jennye Moccasin, MD 08/08/15 [phone]

## 2015-08-08 NOTE — ED Notes (Signed)
Esign documentation not working.

## 2015-08-08 NOTE — ED Notes (Signed)
Add on ethanol

## 2015-08-08 NOTE — ED Notes (Signed)
Pt arrived via ems with c/o fall - per ems pt reports back pain but he has a history of back pain and spinal stenosis - ems reports that they have been going to his home to pick him up every other day for the last few weeks for falls and the family wants him checked

## 2015-08-08 NOTE — ED Notes (Signed)
Patient's discharge instructions reviewed with patient's wife. Esign documentation not working at this time and patient's wife understands instructions.

## 2015-08-22 ENCOUNTER — Emergency Department: Payer: Medicare Other

## 2015-08-22 ENCOUNTER — Observation Stay
Admission: EM | Admit: 2015-08-22 | Discharge: 2015-08-24 | Disposition: A | Discharge status: Home / Self Care | Payer: Medicare Other | Attending: Internal Medicine | Admitting: Internal Medicine

## 2015-08-22 ENCOUNTER — Encounter: Payer: Self-pay | Admitting: *Deleted

## 2015-08-22 DIAGNOSIS — I4892 Unspecified atrial flutter: Secondary | ICD-10-CM | POA: Insufficient documentation

## 2015-08-22 DIAGNOSIS — G8929 Other chronic pain: Secondary | ICD-10-CM | POA: Insufficient documentation

## 2015-08-22 DIAGNOSIS — Z87891 Personal history of nicotine dependence: Secondary | ICD-10-CM | POA: Diagnosis not present

## 2015-08-22 DIAGNOSIS — K2990 Gastroduodenitis, unspecified, without bleeding: Secondary | ICD-10-CM | POA: Insufficient documentation

## 2015-08-22 DIAGNOSIS — Z79899 Other long term (current) drug therapy: Secondary | ICD-10-CM | POA: Insufficient documentation

## 2015-08-22 DIAGNOSIS — J3489 Other specified disorders of nose and nasal sinuses: Secondary | ICD-10-CM

## 2015-08-22 DIAGNOSIS — I482 Chronic atrial fibrillation: Secondary | ICD-10-CM | POA: Insufficient documentation

## 2015-08-22 DIAGNOSIS — Z794 Long term (current) use of insulin: Secondary | ICD-10-CM | POA: Insufficient documentation

## 2015-08-22 DIAGNOSIS — I110 Hypertensive heart disease with heart failure: Secondary | ICD-10-CM | POA: Insufficient documentation

## 2015-08-22 DIAGNOSIS — I2510 Atherosclerotic heart disease of native coronary artery without angina pectoris: Secondary | ICD-10-CM | POA: Insufficient documentation

## 2015-08-22 DIAGNOSIS — I7389 Other specified peripheral vascular diseases: Secondary | ICD-10-CM | POA: Diagnosis not present

## 2015-08-22 DIAGNOSIS — I420 Dilated cardiomyopathy: Secondary | ICD-10-CM | POA: Diagnosis not present

## 2015-08-22 DIAGNOSIS — R42 Dizziness and giddiness: Secondary | ICD-10-CM | POA: Diagnosis not present

## 2015-08-22 DIAGNOSIS — R778 Other specified abnormalities of plasma proteins: Secondary | ICD-10-CM | POA: Diagnosis not present

## 2015-08-22 DIAGNOSIS — M2578 Osteophyte, vertebrae: Secondary | ICD-10-CM | POA: Diagnosis not present

## 2015-08-22 DIAGNOSIS — F329 Major depressive disorder, single episode, unspecified: Secondary | ICD-10-CM | POA: Diagnosis not present

## 2015-08-22 DIAGNOSIS — R7989 Other specified abnormal findings of blood chemistry: Secondary | ICD-10-CM | POA: Diagnosis present

## 2015-08-22 DIAGNOSIS — I5042 Chronic combined systolic (congestive) and diastolic (congestive) heart failure: Secondary | ICD-10-CM | POA: Diagnosis not present

## 2015-08-22 DIAGNOSIS — I428 Other cardiomyopathies: Secondary | ICD-10-CM

## 2015-08-22 DIAGNOSIS — K2970 Gastritis, unspecified, without bleeding: Secondary | ICD-10-CM | POA: Insufficient documentation

## 2015-08-22 DIAGNOSIS — Z95810 Presence of automatic (implantable) cardiac defibrillator: Secondary | ICD-10-CM | POA: Insufficient documentation

## 2015-08-22 DIAGNOSIS — K644 Residual hemorrhoidal skin tags: Secondary | ICD-10-CM | POA: Diagnosis not present

## 2015-08-22 DIAGNOSIS — S0990XA Unspecified injury of head, initial encounter: Secondary | ICD-10-CM

## 2015-08-22 DIAGNOSIS — M8580 Other specified disorders of bone density and structure, unspecified site: Secondary | ICD-10-CM | POA: Diagnosis not present

## 2015-08-22 DIAGNOSIS — K922 Gastrointestinal hemorrhage, unspecified: Secondary | ICD-10-CM | POA: Diagnosis present

## 2015-08-22 DIAGNOSIS — R2681 Unsteadiness on feet: Secondary | ICD-10-CM | POA: Diagnosis not present

## 2015-08-22 DIAGNOSIS — Z9119 Patient's noncompliance with other medical treatment and regimen: Secondary | ICD-10-CM | POA: Diagnosis not present

## 2015-08-22 DIAGNOSIS — R296 Repeated falls: Secondary | ICD-10-CM | POA: Diagnosis present

## 2015-08-22 DIAGNOSIS — W19XXXA Unspecified fall, initial encounter: Secondary | ICD-10-CM | POA: Insufficient documentation

## 2015-08-22 DIAGNOSIS — E871 Hypo-osmolality and hyponatremia: Secondary | ICD-10-CM | POA: Insufficient documentation

## 2015-08-22 DIAGNOSIS — K625 Hemorrhage of anus and rectum: Secondary | ICD-10-CM | POA: Diagnosis not present

## 2015-08-22 DIAGNOSIS — K449 Diaphragmatic hernia without obstruction or gangrene: Secondary | ICD-10-CM | POA: Diagnosis not present

## 2015-08-22 DIAGNOSIS — D509 Iron deficiency anemia, unspecified: Secondary | ICD-10-CM | POA: Insufficient documentation

## 2015-08-22 DIAGNOSIS — Z7982 Long term (current) use of aspirin: Secondary | ICD-10-CM | POA: Insufficient documentation

## 2015-08-22 DIAGNOSIS — K219 Gastro-esophageal reflux disease without esophagitis: Secondary | ICD-10-CM | POA: Insufficient documentation

## 2015-08-22 DIAGNOSIS — I493 Ventricular premature depolarization: Secondary | ICD-10-CM | POA: Insufficient documentation

## 2015-08-22 DIAGNOSIS — F0390 Unspecified dementia without behavioral disturbance: Secondary | ICD-10-CM | POA: Diagnosis not present

## 2015-08-22 DIAGNOSIS — I639 Cerebral infarction, unspecified: Secondary | ICD-10-CM

## 2015-08-22 DIAGNOSIS — E119 Type 2 diabetes mellitus without complications: Secondary | ICD-10-CM | POA: Insufficient documentation

## 2015-08-22 DIAGNOSIS — I4891 Unspecified atrial fibrillation: Secondary | ICD-10-CM | POA: Diagnosis present

## 2015-08-22 DIAGNOSIS — K5730 Diverticulosis of large intestine without perforation or abscess without bleeding: Secondary | ICD-10-CM | POA: Diagnosis not present

## 2015-08-22 LAB — BASIC METABOLIC PANEL
ANION GAP: 8 (ref 5–15)
BUN: 10 mg/dL (ref 6–20)
CALCIUM: 9.3 mg/dL (ref 8.9–10.3)
CO2: 29 mmol/L (ref 22–32)
Chloride: 102 mmol/L (ref 101–111)
Creatinine, Ser: 0.89 mg/dL (ref 0.61–1.24)
Glucose, Bld: 108 mg/dL — ABNORMAL HIGH (ref 65–99)
Potassium: 4.3 mmol/L (ref 3.5–5.1)
SODIUM: 139 mmol/L (ref 135–145)

## 2015-08-22 LAB — CBC
HCT: 47.7 % (ref 40.0–52.0)
HEMOGLOBIN: 15.5 g/dL (ref 13.0–18.0)
MCH: 26.5 pg (ref 26.0–34.0)
MCHC: 32.4 g/dL (ref 32.0–36.0)
MCV: 81.7 fL (ref 80.0–100.0)
PLATELETS: 212 10*3/uL (ref 150–440)
RBC: 5.84 MIL/uL (ref 4.40–5.90)
RDW: 16.7 % — ABNORMAL HIGH (ref 11.5–14.5)
WBC: 8.6 10*3/uL (ref 3.8–10.6)

## 2015-08-22 LAB — URINALYSIS COMPLETE WITH MICROSCOPIC (ARMC ONLY)
BILIRUBIN URINE: NEGATIVE
Bacteria, UA: NONE SEEN
GLUCOSE, UA: NEGATIVE mg/dL
KETONES UR: NEGATIVE mg/dL
LEUKOCYTES UA: NEGATIVE
NITRITE: NEGATIVE
PH: 6 (ref 5.0–8.0)
Protein, ur: NEGATIVE mg/dL
Specific Gravity, Urine: 1.016 (ref 1.005–1.030)

## 2015-08-22 LAB — TROPONIN I
Troponin I: 0.1 ng/mL (ref <0.03)
Troponin I: 0.1 ng/mL (ref <0.03)

## 2015-08-22 NOTE — ED Provider Notes (Addendum)
[facility] Emergency Department Provider Note  ____________________________________________   I have reviewed the triage vital signs and the nursing notes.   HISTORY  Chief Complaint I want to be checked out   HPI Nicolas Morris is a 82 y.o. male with a history of frequent falls, noncompliance with his walker was walking today without his walker. He states that whenever he walks as well as walker he is at risk of falling but he does not like using it. In any event, patient bent over today to decently with a trash loss his balance and fell. He states he felt somewhat dizzy after falling. He does not believe he was dizzy before. He murmurs following. He did hit his head. He has chronic back pain which is not acting out, he has chronic neck pain but that is not worse today or actually even uncomfortable for him at this time. He did not have any postconcussive symptoms. He denies any chest pain shows breath nausea or vomiting. He feels he is at his baseline would like to go home.  This was not a syncopal event.   Past Medical History  Diagnosis Date  . Non-obstructive CAD     a. s/p multiple caths @ [facility] - last reportedly 06/2012;  b. 06/2013 Lexi MV: EF 51%, no ischemia/infarct.  Marland Kitchen NICM (nonischemic cardiomyopathy) (HCC)     a. EF reportedly < 30% 01/2013 ([facility]);  b. 01/2012 s/p MDT DTBB1D1 Viva S CRT-D;  c. 12/2012 Echo: EF 45-50%, inf/post HK, nl RV, mild MR.  . Diabetes mellitus without complication (HCC)   . A-fib (HCC)     a. Permanent, on Xarelto.  . H/O: GI bleed     a. 12/2012 EGD: HH, evidence of reflux. Erosive Gastritis. Nl duodenum;  b. 12/2012 Colonoscopy: Sev sigmoid and desc colon diverticulosis, transverse and ascending colon diverticulosis, nonbleeding internal hemorrhoids, no bleeding.  . Iron deficiency anemia   . Biventricular ICD (implantable cardioverter-defibrillator) -Medtronic 01/12/2013    a. 01/2012 [facility]:  01/2012 s/p MDT  DTBB1D1 Viva S CRT-D.  Atrial port capped (afib).  . Diverticulosis     a. 12/2012 noted on colonoscopy.  . Hiatal hernia     a. 12/2012 noted on EGD.  Marland Kitchen Gastritis     a. 12/2012 noted on EGD.  Marland Kitchen Hypertension   . AICD (automatic cardioverter/defibrillator) present   . Presence of permanent cardiac pacemaker     Patient Active Problem List   Diagnosis Date Noted  . Overdose 06/22/2015  . Dementia, senile with depression 06/22/2015  . Iron deficiency anemia 10/24/2014  . GIB (gastrointestinal bleeding) 08/30/2014  . Hyponatremia 08/30/2014  . Elevated troponin 08/30/2014  . External hemorrhoids 08/30/2014  . Diverticulosis of colon without hemorrhage 08/30/2014  . Gastritis and gastroduodenitis 08/30/2014  . Acute posthemorrhagic anemia 08/25/2014  . Unsteady gait 03/04/2014  . NICM (nonischemic cardiomyopathy) (HCC) 07/20/2013  . Diabetes mellitus without complication (HCC)   . Diverticulosis   . Hiatal hernia   . Non-obstructive CAD   . Neck pain 04/21/2013  . Depression 01/26/2013  . Atrial fibrillation-permanent 01/12/2013  . Biventricular ICD (implantable cardioverter-defibrillator) -Medtronic 01/12/2013  . Congestive dilated cardiomyopathy (HCC) 01/12/2013  . GI bleed 01/12/2013  . Chronic combined systolic and diastolic CHF (congestive heart failure) (HCC) 01/12/2013  . Biventricular ICD (implantable cardioverter-defibrillator) -Medtronic 01/12/2013    Past Surgical History  Procedure Laterality Date  . Cardiac catheterization  2013    [facility]   . Cardiac catheterization  2011  . Defib implant  01/27/2012    Serial # [id number] H Model # [id number]  . Pacemaker insertion  01/27/2012  . Colonoscopy  32014  . Upper gastrointestinal endoscopy  2014  . Fetal blood transfusion  2014  . Cardiac defibrillator placement    . Esophagogastroduodenoscopy (egd) with propofol N/A 08/29/2014    Procedure: ESOPHAGOGASTRODUODENOSCOPY (EGD) WITH PROPOFOL;  Surgeon: Elnita Maxwell, MD;  Location: [facility] ENDOSCOPY;  Service: Endoscopy;  Laterality: N/A;  . Colonoscopy with propofol N/A 08/29/2014    Procedure: COLONOSCOPY WITH PROPOFOL;  Surgeon: Elnita Maxwell, MD;  Location: [facility] ENDOSCOPY;  Service: Endoscopy;  Laterality: N/A;    Current Outpatient Rx  Name  Route  Sig  Dispense  Refill  . acetaminophen (TYLENOL) 500 MG tablet   Oral   Take 500 mg by mouth 3 (three) times daily.         Marland Kitchen aspirin 81 MG chewable tablet   Oral   Chew by mouth daily.         Marland Kitchen buPROPion (WELLBUTRIN XL) 150 MG 24 hr tablet   Oral   Take 150 mg by mouth daily.          . carvedilol (COREG) 3.125 MG tablet   Oral   Take 3.125 mg by mouth 2 (two) times daily with a meal.         . clotrimazole-betamethasone (LOTRISONE) cream   Topical   Apply 1 application topically 2 (two) times daily.         . digoxin (LANOXIN) 0.25 MG tablet   Oral   Take 0.25 mg by mouth daily.         . ferrous sulfate 325 (65 FE) MG tablet   Oral   Take 1 tablet (325 mg total) by mouth 2 (two) times daily with a meal.   60 tablet   3   . finasteride (PROSCAR) 5 MG tablet   Oral   Take 5 mg by mouth daily.         . furosemide (LASIX) 20 MG tablet   Oral   Take 20 mg by mouth daily.         Marland Kitchen gabapentin (NEURONTIN) 600 MG tablet   Oral   Take 600 mg by mouth 3 (three) times daily.         Marland Kitchen lisinopril (PRINIVIL,ZESTRIL) 2.5 MG tablet   Oral   Take 2.5 mg by mouth daily.         Marland Kitchen lovastatin (MEVACOR) 20 MG tablet   Oral   Take 20 mg by mouth at bedtime.         . meclizine (ANTIVERT) 25 MG tablet   Oral   Take 25 mg by mouth 2 (two) times daily.         . Melatonin 1 MG TABS   Oral   Take 1 mg by mouth at bedtime.         . metFORMIN (GLUCOPHAGE) 500 MG tablet   Oral   Take 1,000 mg by mouth 2 (two) times daily with a meal.          . pantoprazole (PROTONIX) 40 MG tablet   Oral   Take 40 mg by mouth daily.          . polyethylene  glycol (MIRALAX / GLYCOLAX) packet   Oral   Take 17 g by mouth daily as needed for mild constipation or moderate constipation.         Marland Kitchen  potassium chloride SA (K-DUR,KLOR-CON) 20 MEQ tablet   Oral   Take 20 mEq by mouth 2 (two) times daily.          . sertraline (ZOLOFT) 100 MG tablet   Oral   Take 100 mg by mouth daily.         . tamsulosin (FLOMAX) 0.4 MG CAPS capsule   Oral   Take 0.4 mg by mouth.         . traMADol (ULTRAM) 50 MG tablet   Oral   Take 1 tablet (50 mg total) by mouth every 6 (six) hours as needed.   15 tablet   0   . traZODone (DESYREL) 100 MG tablet   Oral   Take 200 mg by mouth at bedtime.           Allergies Review of patient's allergies indicates no known allergies.  Family History  Problem Relation Age of Onset  . Family history unknown: Yes    Social History Social History  Substance Use Topics  . Smoking status: Former Smoker -- 3.00 packs/day for 45 years    Types: Cigarettes    Quit date: 01/13/2003  . Smokeless tobacco: None  . Alcohol Use: No    Review of Systems Constitutional: No fever/chills Eyes: No visual changes. ENT: No sore throat. No stiff neck no neck pain Cardiovascular: Denies chest pain. Respiratory: Denies shortness of breath. Gastrointestinal:   no vomiting.  No diarrhea.  No constipation. Genitourinary: Negative for dysuria. Musculoskeletal: Negative lower extremity swelling Skin: Negative for rash. Neurological: Negative for headaches, focal weakness or numbness. 10-point ROS otherwise negative.  ____________________________________________   PHYSICAL EXAM:  VITAL SIGNS: ED Triage Vitals  Enc Vitals Group     BP 08/22/15 1806 139/75 mmHg     Pulse Rate 08/22/15 1806 77     Resp 08/22/15 1806 16     Temp 08/22/15 1806 99.2 F (37.3 C)     Temp Source 08/22/15 1806 Oral     SpO2 08/22/15 1806 98 %     Weight 08/22/15 1806 232 lb (105.235 kg)     Height 08/22/15 1806 6\' 2"  (1.88 m)      Head Cir --      Peak Flow --      Pain Score 08/22/15 1806 8     Pain Loc --      Pain Edu? --      Excl. in GC? --     Constitutional: Alert and oriented. Well appearing and in no acute distress. Eyes: Conjunctivae are normal. PERRL. EOMI. Head: Atraumatic. Nose: No congestion/rhinnorhea. Mouth/Throat: Mucous membranes are moist.  Oropharynx non-erythematous. Neck: No stridor.   Nontender with no meningismus Cardiovascular: Normal rate, regular rhythm. Grossly normal heart sounds.  Good peripheral circulation. Respiratory: Normal respiratory effort.  No retractions. Lungs CTAB. Abdominal: Soft and nontender. No distention. No guarding no rebound Back:  There is no focal tenderness or step off there is no midline tenderness there are no lesions noted. there is no CVA tenderness Musculoskeletal: No lower extremity tenderness. No joint effusions, no DVT signs strong distal pulses no edema Neurologic:  Normal speech and language. No gross focal neurologic deficits are appreciated.  Skin:  Skin is warm, dry and intact. No rash noted. Psychiatric: Mood and affect are normal. Speech and behavior are normal.  ____________________________________________   LABS (all labs ordered are listed, but only abnormal results are displayed)  Labs Reviewed  BASIC METABOLIC PANEL - Abnormal; Notable for  the following:    Glucose, Bld 108 (*)    All other components within normal limits  CBC - Abnormal; Notable for the following:    RDW 16.7 (*)    All other components within normal limits  URINALYSIS COMPLETEWITH MICROSCOPIC ([facility] ONLY) - Abnormal; Notable for the following:    Color, Urine YELLOW (*)    APPearance CLEAR (*)    Hgb urine dipstick 2+ (*)    Squamous Epithelial / LPF 0-5 (*)    All other components within normal limits  TROPONIN I   ____________________________________________  EKG  I personally interpreted any EKGs ordered by me or triage Ventricular paced rhythm rate 76  bpm ____________________________________________  RADIOLOGY  I reviewed any imaging ordered by me or triage that were performed during my shift and, if possible, patient and/or family made aware of any abnormal findings. ____________________________________________   PROCEDURES  Procedure(s) performed: None  Critical Care performed: None  ____________________________________________   INITIAL IMPRESSION / ASSESSMENT AND PLAN / ED COURSE  Pertinent labs & imaging results that were available during my care of the patient were reviewed by me and considered in my medical decision making (see chart for details).  Patient presents again after a fall. Has a history of frequent falls. Was not using his walker, bent over, stood up quickly, lost his balance and fell. He does not have a chest pain or shortness of breath he has no focal or large deficit, his hips are not tender there is no evidence of acute fracture. He states he did hit his head and we did a CT head and neck which is negative for acute intra-cranial or cervical traumatic event. Patient does have some mucosal thickening which is likely chronic however we will refer him to ENT. He remains neurologically intact.   ----------------------------------------- 9:03 PM on 08/22/2015 -----------------------------------------  Patient with chronically mildly elevated troponin has had multiple negative recent cardiac catheterizations including most recently in 2014 as well as 2013. Does have a history of underlying A. fib, right bundle-branch block, ICD and pacer placement. Patient has had no chest pain did not get shocked by his ICD. He has no complaints or symptoms at this time. In 2014 it had largely patent arteries as well as in 2010.  Patient therefore is never had any history of CAD or blockages in his heart despite numerous catheterizations. He does have a recurrent mildly elevated troponin which is been present for some time. His  troponin is not trending up  ----------------------------------------- 11:17 PM on 08/22/2015 -----------------------------------------  Review of notes shows the patient has had difficulty following up with cardiology as an outpatient. Concern also exist for the possibility that the patient has dysrhythmia is causing him to become syncopal and resulting in a demand elevation of his troponin. I think at this time, will be safer to watch him in the hospital. Patient's wife has been drinking, she states, and cannot come get him nor can she reliably keep an eye on him tonight.   ____________________________________________   FINAL CLINICAL IMPRESSION(S) / ED DIAGNOSES  Final diagnoses:  None      This chart was dictated using voice recognition software.  Despite best efforts to proofread,  errors can occur which can change meaning.   he  Jeanmarie Plant, MD 08/22/15 2018  Jeanmarie Plant, MD 08/22/15 [id number]  Jeanmarie Plant, MD 08/22/15 2257  Jeanmarie Plant, MD 08/22/15 1191  Jeanmarie Plant, MD 08/22/15 [id number]

## 2015-08-22 NOTE — ED Notes (Signed)
Pt and Pts wife informed due to pts lab result pt will not be discharged at this time.

## 2015-08-22 NOTE — ED Notes (Addendum)
Per MD order pt was walked around ED with a walker and RN at side. Pt able to walk entire unit without SOB or taking a break but pt had three stumbles while walking that he blamed on the walker. Each time pt leaned to the left and began falling but with a few quick steps and RN holding arm pt was able to catch himself and continue walking. MD made aware.

## 2015-08-22 NOTE — ED Notes (Signed)
Pt arrived to ED via EMS after a fall at home this afternoon. Pt reports bending over and becoming dizzy before falling and hitting head. Pt denies LOC but reports head pain at this time. Pt reports having fallen 10+ times in the past two weeks. Pt has previously been admitted for similar events and is supposed to walk with a walker at home but is not compliant. Hx of dementia but pt is alert and oriented x 4 upon arrival.

## 2015-08-22 NOTE — ED Notes (Signed)
Patient transported to CT 

## 2015-08-22 NOTE — ED Notes (Addendum)
RN called pts spouse to ask if she could come to ED and provide transportation for pt back home. Wife reports she has started drinking and is unable to drive. Wife reports all other family is out of town for the holiday and pt is unable to report other people capable of transportation. Wife verbalized pt needs EMS transport home because he is unable to walk up the stairs. Charge RN made aware.

## 2015-08-23 ENCOUNTER — Observation Stay (HOSPITAL_BASED_OUTPATIENT_CLINIC_OR_DEPARTMENT_OTHER)
Admit: 2015-08-23 | Discharge: 2015-08-23 | Disposition: A | Discharge status: Home / Self Care | Payer: Medicare Other | Attending: Cardiovascular Disease | Admitting: Cardiovascular Disease

## 2015-08-23 DIAGNOSIS — R296 Repeated falls: Secondary | ICD-10-CM | POA: Diagnosis not present

## 2015-08-23 DIAGNOSIS — I5042 Chronic combined systolic (congestive) and diastolic (congestive) heart failure: Secondary | ICD-10-CM | POA: Diagnosis not present

## 2015-08-23 DIAGNOSIS — I482 Chronic atrial fibrillation: Secondary | ICD-10-CM

## 2015-08-23 DIAGNOSIS — W19XXXA Unspecified fall, initial encounter: Secondary | ICD-10-CM | POA: Insufficient documentation

## 2015-08-23 DIAGNOSIS — R42 Dizziness and giddiness: Secondary | ICD-10-CM | POA: Diagnosis not present

## 2015-08-23 DIAGNOSIS — R7989 Other specified abnormal findings of blood chemistry: Secondary | ICD-10-CM | POA: Diagnosis not present

## 2015-08-23 LAB — CBC
HCT: 45.2 % (ref 40.0–52.0)
HEMATOCRIT: 45.9 % (ref 40.0–52.0)
HEMOGLOBIN: 15.2 g/dL (ref 13.0–18.0)
Hemoglobin: 15.3 g/dL (ref 13.0–18.0)
MCH: 26.7 pg (ref 26.0–34.0)
MCH: 27.2 pg (ref 26.0–34.0)
MCHC: 33.1 g/dL (ref 32.0–36.0)
MCHC: 33.9 g/dL (ref 32.0–36.0)
MCV: 80.6 fL (ref 80.0–100.0)
MCV: 80.3 fL (ref 80.0–100.0)
PLATELETS: 195 10*3/uL (ref 150–440)
Platelets: 191 10*3/uL (ref 150–440)
RBC: 5.7 MIL/uL (ref 4.40–5.90)
RBC: 5.63 MIL/uL (ref 4.40–5.90)
RDW: 16.3 % — ABNORMAL HIGH (ref 11.5–14.5)
RDW: 16.4 % — AB (ref 11.5–14.5)
WBC: 9.6 10*3/uL (ref 3.8–10.6)
WBC: 8.8 10*3/uL (ref 3.8–10.6)

## 2015-08-23 LAB — URINALYSIS COMPLETE WITH MICROSCOPIC (ARMC ONLY)
Bacteria, UA: NONE SEEN
Bilirubin Urine: NEGATIVE
GLUCOSE, UA: NEGATIVE mg/dL
KETONES UR: NEGATIVE mg/dL
Leukocytes, UA: NEGATIVE
NITRITE: NEGATIVE
Protein, ur: NEGATIVE mg/dL
RBC / HPF: NONE SEEN RBC/hpf (ref 0–5)
SPECIFIC GRAVITY, URINE: 1.005 (ref 1.005–1.030)
Squamous Epithelial / LPF: NONE SEEN
WBC UA: NONE SEEN WBC/hpf (ref 0–5)
pH: 7 (ref 5.0–8.0)

## 2015-08-23 LAB — GLUCOSE, CAPILLARY
GLUCOSE-CAPILLARY: 98 mg/dL (ref 65–99)
GLUCOSE-CAPILLARY: 106 mg/dL — AB (ref 65–99)
Glucose-Capillary: 117 mg/dL — ABNORMAL HIGH (ref 65–99)
Glucose-Capillary: 117 mg/dL — ABNORMAL HIGH (ref 65–99)

## 2015-08-23 LAB — BASIC METABOLIC PANEL
ANION GAP: 8 (ref 5–15)
BUN: 15 mg/dL (ref 6–20)
CALCIUM: 9 mg/dL (ref 8.9–10.3)
CO2: 29 mmol/L (ref 22–32)
CREATININE: 0.87 mg/dL (ref 0.61–1.24)
Chloride: 102 mmol/L (ref 101–111)
GFR calc Af Amer: >60 mL/min (ref >60)
GLUCOSE: 110 mg/dL — AB (ref 65–99)
Potassium: 4.1 mmol/L (ref 3.5–5.1)
Sodium: 139 mmol/L (ref 135–145)

## 2015-08-23 LAB — ECHOCARDIOGRAM COMPLETE
AOPV: 0.59 m/s
AV Peak grad: 3 mmHg
AV peak Index: 0.92
AV pk vel: 92.8 cm/s
AVAREAVTI: 2.04 cm2
CHL CUP MV DEC (S): 164
EWDT: 164 ms
FS: 28 % (ref 28–44)
Height: 74 in
IVS/LV PW RATIO, ED: 1.31
LA diam index: 2.3 cm/m2
LASIZE: 51 mm
LAVOLA4C: 49.4 mL
LEFT ATRIUM END SYS DIAM: 51 mm
LV PW d: 12.4 mm — AB (ref 0.6–1.1)
LVOT area: 3.46 cm2
LVOTD: 21 mm
LVOTPV: 54.8 cm/s
MV Peak grad: 4 mmHg
MV pk E vel: 100 m/s
Weight: 3368 oz

## 2015-08-23 LAB — HEMOGLOBIN A1C: HEMOGLOBIN A1C: 5.8 % (ref 4.0–6.0)

## 2015-08-23 LAB — TROPONIN I
TROPONIN I: 0.12 ng/mL — AB (ref <0.03)
TROPONIN I: 0.15 ng/mL — AB (ref <0.03)
TROPONIN I: 0.11 ng/mL — AB (ref <0.03)

## 2015-08-23 LAB — CREATININE, SERUM: Creatinine, Ser: 0.79 mg/dL (ref 0.61–1.24)

## 2015-08-23 LAB — MRSA PCR SCREENING: MRSA by PCR: NEGATIVE

## 2015-08-23 LAB — TSH: TSH: 2.119 u[IU]/mL (ref 0.350–4.500)

## 2015-08-23 MED ORDER — ACETAMINOPHEN 325 MG PO TABS
650.0000 mg | ORAL_TABLET | Freq: Four times a day (QID) | ORAL | Status: DC | PRN
  Administered 2015-08-23 – 2015-08-24 (×5): 650 mg via ORAL
  Filled 2015-08-23 (×5): qty 2

## 2015-08-23 MED ORDER — ONDANSETRON HCL 4 MG/2ML IJ SOLN: 4.0000 mg | Freq: Four times a day (QID) | INTRAMUSCULAR | Status: DC | PRN

## 2015-08-23 MED ORDER — TAMSULOSIN HCL 0.4 MG PO CAPS
0.4000 mg | ORAL_CAPSULE | Freq: Every day | ORAL | Status: DC
  Administered 2015-08-23 – 2015-08-24 (×2): 0.4 mg via ORAL
  Filled 2015-08-23 (×2): qty 1

## 2015-08-23 MED ORDER — FUROSEMIDE 20 MG PO TABS
20.0000 mg | ORAL_TABLET | Freq: Every day | ORAL | Status: DC
  Administered 2015-08-23 – 2015-08-24 (×2): 20 mg via ORAL
  Filled 2015-08-23 (×2): qty 1

## 2015-08-23 MED ORDER — ACETAMINOPHEN 650 MG RE SUPP: 650.0000 mg | Freq: Four times a day (QID) | RECTAL | Status: DC | PRN

## 2015-08-23 MED ORDER — SERTRALINE HCL 100 MG PO TABS
100.0000 mg | ORAL_TABLET | Freq: Every day | ORAL | Status: DC
  Administered 2015-08-23 – 2015-08-24 (×2): 100 mg via ORAL
  Filled 2015-08-23 (×2): qty 1

## 2015-08-23 MED ORDER — ATORVASTATIN CALCIUM 20 MG PO TABS
80.0000 mg | ORAL_TABLET | Freq: Every day | ORAL | Status: DC
  Administered 2015-08-23 – 2015-08-24 (×2): 80 mg via ORAL
  Filled 2015-08-23 (×2): qty 4

## 2015-08-23 MED ORDER — ONDANSETRON HCL 4 MG PO TABS: 4.0000 mg | ORAL_TABLET | Freq: Four times a day (QID) | ORAL | Status: DC | PRN

## 2015-08-23 MED ORDER — ENOXAPARIN SODIUM 40 MG/0.4ML ~~LOC~~ SOLN
40.0000 mg | SUBCUTANEOUS | Status: DC
  Administered 2015-08-23 (×2): 40 mg via SUBCUTANEOUS
  Filled 2015-08-23 (×2): qty 0.4

## 2015-08-23 MED ORDER — PANTOPRAZOLE SODIUM 40 MG PO TBEC
40.0000 mg | DELAYED_RELEASE_TABLET | Freq: Every day | ORAL | Status: DC
  Administered 2015-08-23 – 2015-08-24 (×2): 40 mg via ORAL
  Filled 2015-08-23 (×2): qty 1

## 2015-08-23 MED ORDER — ASPIRIN 81 MG PO CHEW
81.0000 mg | CHEWABLE_TABLET | Freq: Every day | ORAL | Status: DC
  Administered 2015-08-23 – 2015-08-24 (×2): 81 mg via ORAL
  Filled 2015-08-23 (×2): qty 1

## 2015-08-23 MED ORDER — INSULIN ASPART 100 UNIT/ML ~~LOC~~ SOLN
0.0000 [IU] | Freq: Three times a day (TID) | SUBCUTANEOUS | Status: DC
  Administered 2015-08-24: 2 [IU] via SUBCUTANEOUS
  Filled 2015-08-23: qty 2

## 2015-08-23 MED ORDER — INSULIN ASPART 100 UNIT/ML ~~LOC~~ SOLN: 0.0000 [IU] | Freq: Every day | SUBCUTANEOUS | Status: DC

## 2015-08-23 MED ORDER — DIGOXIN 250 MCG PO TABS
0.2500 mg | ORAL_TABLET | Freq: Every day | ORAL | Status: DC
  Administered 2015-08-23 – 2015-08-24 (×2): 0.25 mg via ORAL
  Filled 2015-08-23 (×2): qty 1

## 2015-08-23 MED ORDER — FINASTERIDE 5 MG PO TABS
5.0000 mg | ORAL_TABLET | Freq: Every day | ORAL | Status: DC
  Administered 2015-08-23 – 2015-08-24 (×2): 5 mg via ORAL
  Filled 2015-08-23 (×2): qty 1

## 2015-08-23 MED ORDER — BUPROPION HCL ER (XL) 150 MG PO TB24
300.0000 mg | ORAL_TABLET | Freq: Every day | ORAL | Status: DC
  Administered 2015-08-23 – 2015-08-24 (×2): 300 mg via ORAL
  Filled 2015-08-23 (×2): qty 2

## 2015-08-23 MED ORDER — SODIUM CHLORIDE 0.9% FLUSH
3.0000 mL | Freq: Two times a day (BID) | INTRAVENOUS | Status: DC
  Administered 2015-08-23 – 2015-08-24 (×4): 3 mL via INTRAVENOUS

## 2015-08-23 MED ORDER — LISINOPRIL 5 MG PO TABS
2.5000 mg | ORAL_TABLET | Freq: Every day | ORAL | Status: DC
  Administered 2015-08-23 – 2015-08-24 (×2): 2.5 mg via ORAL
  Filled 2015-08-23 (×2): qty 1

## 2015-08-23 MED ORDER — CARVEDILOL 3.125 MG PO TABS
3.1250 mg | ORAL_TABLET | Freq: Two times a day (BID) | ORAL | Status: DC
  Administered 2015-08-23 – 2015-08-24 (×3): 3.125 mg via ORAL
  Filled 2015-08-23 (×3): qty 1

## 2015-08-23 NOTE — H&P (Signed)
[facility] [place] [facility]   PATIENT NAME: Nicolas Morris    MR#:  [id number]  DATE OF BIRTH:  June 17, 1933  DATE OF ADMISSION:  08/22/2015  PRIMARY CARE PHYSICIAN: WHITE, Arlyss Repress, NP   REQUESTING/REFERRING PHYSICIAN: Alphonzo Lemmings, MD  CHIEF COMPLAINT:   Chief Complaint  Patient presents with  . Fall  . Weakness    HISTORY OF PRESENT ILLNESS:  Nicolas Morris  is a 82 y.o. male who presents with A fall. Patient states that he has been having frequent falls of late. He associates these also with episodes of dizziness. He denies recalling having a full syncopal episode at any point. He did not sustain any significant injury and his fall tonight. However, on evaluation here in the ED he does have an elevated troponin of 0.1. He has significant cardiac history including prior pacemaker and ICD insertion. Given this picture hospitals were called for admission.  PAST MEDICAL HISTORY:   Past Medical History  Diagnosis Date  . Non-obstructive CAD     a. s/p multiple caths @ [facility] - last reportedly 06/2012;  b. 06/2013 Lexi MV: EF 51%, no ischemia/infarct.  Marland Kitchen NICM (nonischemic cardiomyopathy) (HCC)     a. EF reportedly < 30% 01/2013 ([facility]);  b. 01/2012 s/p MDT DTBB1D1 Viva S CRT-D;  c. 12/2012 Echo: EF 45-50%, inf/post HK, nl RV, mild MR.  . Diabetes mellitus without complication (HCC)   . A-fib (HCC)     a. Permanent, on Xarelto.  . H/O: GI bleed     a. 12/2012 EGD: HH, evidence of reflux. Erosive Gastritis. Nl duodenum;  b. 12/2012 Colonoscopy: Sev sigmoid and desc colon diverticulosis, transverse and ascending colon diverticulosis, nonbleeding internal hemorrhoids, no bleeding.  . Iron deficiency anemia   . Biventricular ICD (implantable cardioverter-defibrillator) -Medtronic 01/12/2013    a. 01/2012 [facility]:  01/2012 s/p MDT DTBB1D1 Viva S CRT-D.  Atrial port capped (afib).  . Diverticulosis     a. 12/2012 noted on colonoscopy.  . Hiatal hernia      a. 12/2012 noted on EGD.  Marland Kitchen Gastritis     a. 12/2012 noted on EGD.  Marland Kitchen Hypertension   . AICD (automatic cardioverter/defibrillator) present   . Presence of permanent cardiac pacemaker     PAST SURGICAL HISTORY:   Past Surgical History  Procedure Laterality Date  . Cardiac catheterization  2013    [facility]   . Cardiac catheterization  2011  . Defib implant  01/27/2012    Serial # [id number] H Model # [id number]  . Pacemaker insertion  01/27/2012  . Colonoscopy  32014  . Upper gastrointestinal endoscopy  2014  . Fetal blood transfusion  2014  . Cardiac defibrillator placement    . Esophagogastroduodenoscopy (egd) with propofol N/A 08/29/2014    Procedure: ESOPHAGOGASTRODUODENOSCOPY (EGD) WITH PROPOFOL;  Surgeon: Elnita Maxwell, MD;  Location: [facility] ENDOSCOPY;  Service: Endoscopy;  Laterality: N/A;  . Colonoscopy with propofol N/A 08/29/2014    Procedure: COLONOSCOPY WITH PROPOFOL;  Surgeon: Elnita Maxwell, MD;  Location: [facility] ENDOSCOPY;  Service: Endoscopy;  Laterality: N/A;    SOCIAL HISTORY:   Social History  Substance Use Topics  . Smoking status: Former Smoker -- 3.00 packs/day for 45 years    Types: Cigarettes    Quit date: 01/13/2003  . Smokeless tobacco: Not on file  . Alcohol Use: No    FAMILY HISTORY:   Family History  Problem Relation Age of Onset  . Family history unknown: Yes  DRUG ALLERGIES:  No Known Allergies  MEDICATIONS AT HOME:   Prior to Admission medications   Medication Sig Start Date End Date Taking? Authorizing Provider  acetaminophen (TYLENOL) 500 MG tablet Take 500 mg by mouth 3 (three) times daily.   Yes Historical Provider, MD  aspirin 81 MG chewable tablet Chew by mouth daily.   Yes Historical Provider, MD  atorvastatin (LIPITOR) 80 MG tablet Take 80 mg by mouth daily.   Yes Historical Provider, MD  betamethasone dipropionate (DIPROLENE) 0.05 % cream Apply 1 application topically 2 (two) times daily.   Yes Historical Provider, MD   buPROPion (WELLBUTRIN XL) 300 MG 24 hr tablet Take 300 mg by mouth daily.   Yes Historical Provider, MD  carvedilol (COREG) 3.125 MG tablet Take 3.125 mg by mouth 2 (two) times daily with a meal.   Yes Historical Provider, MD  digoxin (LANOXIN) 0.25 MG tablet Take 0.25 mg by mouth daily. 01/26/13  Yes Duke Salvia, MD  finasteride (PROSCAR) 5 MG tablet Take 5 mg by mouth daily.   Yes Historical Provider, MD  furosemide (LASIX) 20 MG tablet Take 20 mg by mouth daily.   Yes Historical Provider, MD  gabapentin (NEURONTIN) 600 MG tablet Take 600 mg by mouth 3 (three) times daily.   Yes Historical Provider, MD  lisinopril (PRINIVIL,ZESTRIL) 2.5 MG tablet Take 2.5 mg by mouth daily.   Yes Historical Provider, MD  Melatonin 1 MG TABS Take 1 mg by mouth at bedtime.   Yes Historical Provider, MD  metFORMIN (GLUCOPHAGE) 500 MG tablet Take 1,000 mg by mouth 2 (two) times daily with a meal.    Yes Historical Provider, MD  pantoprazole (PROTONIX) 40 MG tablet Take 40 mg by mouth daily.    Yes Historical Provider, MD  potassium chloride SA (K-DUR,KLOR-CON) 20 MEQ tablet Take 20 mEq by mouth 2 (two) times daily.  02/17/15  Yes Historical Provider, MD  sertraline (ZOLOFT) 100 MG tablet Take 100 mg by mouth daily.   Yes Historical Provider, MD  tamsulosin (FLOMAX) 0.4 MG CAPS capsule Take 0.4 mg by mouth.   Yes Historical Provider, MD  traZODone (DESYREL) 100 MG tablet Take 200 mg by mouth at bedtime.   Yes Historical Provider, MD    REVIEW OF SYSTEMS:  Review of Systems  Constitutional: Negative for fever, chills, weight loss and malaise/fatigue.  HENT: Negative for ear pain, hearing loss and tinnitus.   Eyes: Negative for blurred vision, double vision, pain and redness.  Respiratory: Negative for cough, hemoptysis and shortness of breath.   Cardiovascular: Positive for palpitations. Negative for chest pain, orthopnea and leg swelling.  Gastrointestinal: Negative for nausea, vomiting, abdominal pain,  diarrhea and constipation.  Genitourinary: Negative for dysuria, frequency and hematuria.  Musculoskeletal: Positive for falls. Negative for back pain, joint pain and neck pain.  Skin:       No acne, rash, or lesions  Neurological: Positive for dizziness. Negative for tremors, focal weakness and weakness.  Endo/Heme/Allergies: Negative for polydipsia. Does not bruise/bleed easily.  Psychiatric/Behavioral: Negative for depression. The patient is not nervous/anxious and does not have insomnia.      VITAL SIGNS:   Filed Vitals:   08/22/15 1830 08/22/15 1900 08/22/15 2030 08/22/15 2305  BP: 141/72 149/62 161/93 161/86  Pulse: 76 74 86 88  Temp:      TempSrc:      Resp: Height:      Weight:      SpO2: 97% 99%  98% 7%   Wt Readings from Last 3 Encounters:  08/22/15 105.235 kg (232 lb)  08/08/15 105.688 kg (233 lb)  06/22/15 105.688 kg (233 lb)    PHYSICAL EXAMINATION:  Physical Exam  Vitals reviewed. Constitutional: He is oriented to person, place, and time. He appears well-developed and well-nourished. No distress.  HENT:  Head: Normocephalic and atraumatic.  Mouth/Throat: Oropharynx is clear and moist.  Eyes: Conjunctivae and EOM are normal. Pupils are equal, round, and reactive to light. No scleral icterus.  Neck: Normal range of motion. Neck supple. No JVD present. No thyromegaly present.  Cardiovascular: Intact distal pulses.  Exam reveals no gallop and no friction rub.   No murmur heard. Controlled rate, paced rhythm  Respiratory: Effort normal and breath sounds normal. No respiratory distress. He has no wheezes. He has no rales.  GI: Soft. Bowel sounds are normal. He exhibits no distension. There is no tenderness.  Musculoskeletal: Normal range of motion. He exhibits no edema.  No arthritis, no gout  Lymphadenopathy:    He has no cervical adenopathy.  Neurological: He is alert and oriented to person, place, and time. No cranial nerve deficit.  No  dysarthria, no aphasia  Skin: Skin is warm and dry. No rash noted. No erythema.  Psychiatric: He has a normal mood and affect. His behavior is normal. Judgment and thought content normal.    LABORATORY PANEL:   CBC  Recent Labs Lab 08/22/15 1814  WBC 8.6  HGB 15.5  HCT 47.7  PLT 212   ------------------------------------------------------------------------------------------------------------------  Chemistries   Recent Labs Lab 08/22/15 1814  NA 139  K 4.3  CL 102  CO2 29  GLUCOSE 108*  BUN 10  CREATININE 0.89  CALCIUM 9.3   ------------------------------------------------------------------------------------------------------------------  Cardiac Enzymes  Recent Labs Lab 08/22/15 2149  TROPONINI 0.10*   ------------------------------------------------------------------------------------------------------------------  RADIOLOGY:  Ct Head Wo Contrast  08/22/2015  CLINICAL DATA:  Pain and dizziness following fall EXAM: CT HEAD WITHOUT CONTRAST CT CERVICAL SPINE WITHOUT CONTRAST TECHNIQUE: Multidetector CT imaging of the head and cervical spine was performed following the standard protocol without intravenous contrast. Multiplanar CT image reconstructions of the cervical spine were also generated. COMPARISON:  Cervical spine CT April 11, 2015; head CT August 08, 2015 FINDINGS: CT HEAD FINDINGS There is mild diffuse atrophy. There is no intracranial mass, hemorrhage, extra-axial fluid collection, or midline shift. There is patchy small vessel disease in the centra semiovale bilaterally. Elsewhere gray-white compartments appear normal. No acute infarct is evident. There are foci of calcification in both cavernous carotid arteries. The bony calvarium appears intact. Mastoid air cells are clear although somewhat hypoplastic on the right, stable. There is extensive mucosal thickening throughout much of the left maxillary antrum with calcification in this inspissated mucus  material in the left maxillary antrum. Visualized orbits appear symmetric bilaterally. CT CERVICAL SPINE FINDINGS There is no demonstrable fracture or spondylolisthesis. Prevertebral soft tissues are normal. There is arthropathy with marked narrowing in the predental space region. There is moderately severe disc space narrowing at C5-6 and C6-7. There is milder narrowing at C7-T1. There are prominent anterior osteophytes at C4, C5, C6, and C7. There is facet hypertrophy at most levels bilaterally. There is moderate exit foraminal narrowing at multiple levels bilaterally. There is no frank disc extrusion or stenosis. Note that there is calcification in the posterior longitudinal ligament at C5-6. There is fairly extensive carotid artery calcification bilaterally, more pronounced on the right than on the left. IMPRESSION: CT head: Atrophy with  patchy periventricular small vessel disease. No intracranial mass, hemorrhage, or extra-axial fluid collection. No acute infarct evident. There are foci of arterial vascular calcification. There is extensive mucosal thickening in the left maxillary antrum with calcification within this mucosal thickening. Calcification of this nature has been associated with fungal colonization within the respective sinus. CT cervical spine: No acute fracture or spondylolisthesis. Multilevel arthropathy. Extensive carotid artery calcification bilaterally, more pronounced on the right than on the left. Electronically Signed   By: Bretta Bang III M.D.   On: 08/22/2015 19:43   Ct Cervical Spine Wo Contrast  08/22/2015  CLINICAL DATA:  Pain and dizziness following fall EXAM: CT HEAD WITHOUT CONTRAST CT CERVICAL SPINE WITHOUT CONTRAST TECHNIQUE: Multidetector CT imaging of the head and cervical spine was performed following the standard protocol without intravenous contrast. Multiplanar CT image reconstructions of the cervical spine were also generated. COMPARISON:  Cervical spine CT April 11, 2015; head CT August 08, 2015 FINDINGS: CT HEAD FINDINGS There is mild diffuse atrophy. There is no intracranial mass, hemorrhage, extra-axial fluid collection, or midline shift. There is patchy small vessel disease in the centra semiovale bilaterally. Elsewhere gray-white compartments appear normal. No acute infarct is evident. There are foci of calcification in both cavernous carotid arteries. The bony calvarium appears intact. Mastoid air cells are clear although somewhat hypoplastic on the right, stable. There is extensive mucosal thickening throughout much of the left maxillary antrum with calcification in this inspissated mucus material in the left maxillary antrum. Visualized orbits appear symmetric bilaterally. CT CERVICAL SPINE FINDINGS There is no demonstrable fracture or spondylolisthesis. Prevertebral soft tissues are normal. There is arthropathy with marked narrowing in the predental space region. There is moderately severe disc space narrowing at C5-6 and C6-7. There is milder narrowing at C7-T1. There are prominent anterior osteophytes at C4, C5, C6, and C7. There is facet hypertrophy at most levels bilaterally. There is moderate exit foraminal narrowing at multiple levels bilaterally. There is no frank disc extrusion or stenosis. Note that there is calcification in the posterior longitudinal ligament at C5-6. There is fairly extensive carotid artery calcification bilaterally, more pronounced on the right than on the left. IMPRESSION: CT head: Atrophy with patchy periventricular small vessel disease. No intracranial mass, hemorrhage, or extra-axial fluid collection. No acute infarct evident. There are foci of arterial vascular calcification. There is extensive mucosal thickening in the left maxillary antrum with calcification within this mucosal thickening. Calcification of this nature has been associated with fungal colonization within the respective sinus. CT cervical spine: No acute fracture or  spondylolisthesis. Multilevel arthropathy. Extensive carotid artery calcification bilaterally, more pronounced on the right than on the left. Electronically Signed   By: Bretta Bang III M.D.   On: 08/22/2015 19:43    EKG:   Orders placed or performed during the hospital encounter of 08/22/15  . ED EKG  . ED EKG    IMPRESSION AND PLAN:  Principal Problem:   Frequent falls - unclear etiology. Potentially related to cardiac process, or potentially related to when his medications used to treat his cardiac pathology. We will admit him for observation on telemetry, and get a cardiology consult for further recommendations. Active Problems:   Elevated troponin - elevated to 0.1, second set was also 0.1. Unclear if he potentially has an elevated baseline troponin, but we will trend her tonight and consult cardiology as above.   Atrial fibrillation-permanent - status post ICD and pacemaker. His rhythm is currently A. Fib-paced.   Chronic  combined systolic and diastolic CHF (congestive heart failure) (HCC) - continue home meds, does not seem to be in exacerbation at this time   Diabetes mellitus without complication (HCC) - sliding scale insulin with corresponding glucose checks and a carb modified diet  All the records are reviewed and case discussed with ED provider. Management plans discussed with the patient and/or family.  DVT PROPHYLAXIS: SubQ lovenox  GI PROPHYLAXIS: PPI  ADMISSION STATUS: Observation  CODE STATUS: DNR Code Status History    Date Active Date Inactive Code Status Order ID Comments User Context   08/25/2014 11:33 PM 08/30/2014  3:25 PM DNR [id number]  Oralia Manis, MD Inpatient   08/25/2014 10:30 PM 08/25/2014 11:33 PM Full Code [id number]  Katharina Caper, MD Inpatient   08/25/2014  8:32 PM 08/25/2014 10:30 PM DNR [id number]  Katharina Caper, MD ED    Questions for Most Recent Historical Code Status (Order [id number])    Question Answer Comment   In the event of cardiac or  respiratory ARREST Do not call a "code blue"    In the event of cardiac or respiratory ARREST Do not perform Intubation, CPR, defibrillation or ACLS    In the event of cardiac or respiratory ARREST Use medication by any route, position, wound care, and other measures to relive pain and suffering. May use oxygen, suction and manual treatment of airway obstruction as needed for comfort.     Advance Directive Documentation        Most Recent Value   Type of Advance Directive  Healthcare Power of Attorney Jesse.Bridges ]   Pre-existing out of facility DNR order (yellow form or pink MOST form)     "MOST" Form in Place?        TOTAL TIME TAKING CARE OF THIS PATIENT: 40 minutes.    Aariona Momon FIELDING 08/23/2015, 12:15 AM  [facility]  Office  [phone]  CC: Primary care physician; WHITE, Arlyss Repress, NP

## 2015-08-23 NOTE — Progress Notes (Signed)
Patient pulled out IV this morning. Per Dr. Elisabeth Pigeon, patient will likely be discharged after echo is completed and read. Do not need to start a new IV at this time.

## 2015-08-23 NOTE — Plan of Care (Signed)
Problem: Education: Goal: Knowledge of [facility] Education information/materials will improve Outcome: Progressing Patient has had several of falls. Education provided. Given handouts.

## 2015-08-23 NOTE — Progress Notes (Signed)
*  PRELIMINARY RESULTS* Echocardiogram 2D Echocardiogram has been performed.  Cristela Blue 08/23/2015, 2:42 PM

## 2015-08-23 NOTE — Progress Notes (Signed)
Per Ronny Bacon, Infection Prevention, patient has history of MRSA and needs to be PCR screened. Will place order.

## 2015-08-23 NOTE — Care Management (Signed)
Spoke with patient briefly regarding home health PT follow up. Patient lives at home with his wife in low income apartments. He states that his PCP comes to the house to see him. He has a cane and a walker. Does not know what pharmacy they use. He ask that I come back tomorrow and speak with his wife in the morning.

## 2015-08-23 NOTE — Evaluation (Signed)
Physical Therapy Evaluation Patient Details Name: Nicolas Morris MRN: [id number] DOB: Aug 04, 1933 Today's Date: 08/23/2015   History of Present Illness  Pt is a 82 y/o male that presents with frequent falls, has been noted to have chronic a-fib. He reports episodes of dizziness, elevated troponins noted to likely be in the setting of demand ischemia.   Clinical Impression  Patient seen as he has been having increasingly frequent falls at home, he reports x 5 last week alone. He states the falls began last November, have been roughly x2 per month since that time, usually in the bathroom where he does not bring an AD with him. There has been some concern about his reports of dizziness with transitions, noted to increase HR from 61 bpm to 90 bpm in sit to stand which may account for some of this, no orthostatic BP changes noted. He does demonstrate decreased stride length, increased reliance on UEs for sit to stands indicative of LE weakness. He would benefit from HHPT to address LE weakness, home set up changes, and concerns of falls around the bathroom.     Follow Up Recommendations Home health PT    Equipment Recommendations  Rolling walker with 5" wheels    Recommendations for Other Services       Precautions / Restrictions Precautions Precautions: Fall Restrictions Weight Bearing Restrictions: No      Mobility  Bed Mobility               General bed mobility comments: Pt received in recliner.   Transfers Overall transfer level: Needs assistance Equipment used: Rolling walker (2 wheeled) Transfers: Sit to/from Stand Sit to Stand: Supervision;Min guard         General transfer comment: Patient noted to lean posteriorly on a few attempts, able to self right with RW for assist.   Ambulation/Gait Ambulation/Gait assistance: Supervision;Min guard Ambulation Distance (Feet): 200 Feet Assistive device: Rolling walker (2 wheeled) Gait Pattern/deviations: WFL(Within  Functional Limits);Trunk flexed;Decreased stance time - left   Gait velocity interpretation: Below normal speed for age/gender General Gait Details: Slight antalgia noted on LLE, though this appears to be chronic as he has had mild knee pain on L from fall several months ago. No buckling noted, speed appears to be at his normal.   Information systems manager [place] (Stroke Patients Only)       Balance Overall balance assessment: Needs assistance Sitting-balance support: No upper extremity supported Sitting balance-Leahy Scale: Good     Standing balance support: Bilateral upper extremity supported Standing balance-Leahy Scale: Fair Standing balance comment: Modified DGI of 11/12 with RW, 5x sit to stand - 26 seconds with heavy use of UEs.                              Pertinent Vitals/Pain Pain Assessment: No/denies pain (Just from bruising in areas where he had hit while falling)    Home Living Family/patient expects to be discharged to:: Private residence Living Arrangements: Spouse/significant other Available Help at Discharge: Family;Available 24 hours/day Type of Home: Apartment Home Access: Stairs to enter   Entrance Stairs-Number of Steps: 3 Home Layout:  (He made it appear as though there are 4 steps inside the apartment?) Home Equipment: Walker - 4 wheels;Cane - single point      Prior Function Level of Independence: Independent with assistive device(s);Needs assistance  Comments: Patient reports he uses SPC or rollator, occasionally furniture cruising depending on situation. In most recent fall he was in the bathroom without AD. Per old notes he recevies assistance for bathing, he states he will wake up at night and go to the kitchen.      Hand Dominance        Extremity/Trunk Assessment   Upper Extremity Assessment: Overall WFL for tasks assessed           Lower Extremity Assessment: Overall WFL for  tasks assessed         Communication   Communication: No difficulties  Cognition Arousal/Alertness: Awake/alert Behavior During Therapy: WFL for tasks assessed/performed Overall Cognitive Status: Within Functional Limits for tasks assessed                      General Comments      Exercises        Assessment/Plan    PT Assessment Patient needs continued PT services  PT Diagnosis Difficulty walking   PT Problem List Decreased strength;Decreased balance  PT Treatment Interventions DME instruction;Gait training;Stair training;Therapeutic activities;Therapeutic exercise;Balance training   PT Goals (Current goals can be found in the Care Plan section) Acute Rehab PT Goals Patient Stated Goal: To stop falling.  PT Goal Formulation: With patient Time For Goal Achievement: 09/06/15 Potential to Achieve Goals: Good    Frequency Min 2X/week   Barriers to discharge Decreased caregiver support Patient raises some question about his wife's ability to help him if needed.     Co-evaluation               End of Session Equipment Utilized During Treatment: Gait belt Activity Tolerance: Patient tolerated treatment well Patient left: in chair;with call bell/phone within reach;with chair alarm set Nurse Communication: Mobility status    Functional Assessment Tool Used: Modified DGI, 5x sit to stand  Functional Limitation: Mobility: Walking and moving around Mobility: Walking and Moving Around Current Status (Z6109): At least 1 percent but less than 20 percent impaired, limited or restricted Mobility: Walking and Moving Around Goal Status [phone]): At least 1 percent but less than 20 percent impaired, limited or restricted    Time: 0981-1914 PT Time Calculation (min) (ACUTE ONLY): 25 min   Charges:   PT Evaluation $PT Eval Moderate Complexity: 1 Procedure     PT G Codes:   PT G-Codes **NOT FOR INPATIENT CLASS** Functional Assessment Tool Used: Modified DGI, 5x  sit to stand  Functional Limitation: Mobility: Walking and moving around Mobility: Walking and Moving Around Current Status (N8295): At least 1 percent but less than 20 percent impaired, limited or restricted Mobility: Walking and Moving Around Goal Status [phone]): At least 1 percent but less than 20 percent impaired, limited or restricted   Kerin Ransom, PT, DPT    08/23/2015, 2:55 PM

## 2015-08-23 NOTE — Consult Note (Signed)
Cardiology Consultation Note  Patient ID: Nicolas Morris, MRN: [id number], DOB/AGE: [birth year] 82 y.o. Admit date: 08/22/2015   Date of Consult: 08/23/2015 Primary Physician: WHITE, Arlyss Repress, NP Primary Cardiologist: Dr. Mariah Milling, MD Requesting Physician: Dr. Anne Hahn, MD  Chief Complaint: Fall Reason for Consult: Fall/elevated troponin  HPI: 82 y.o. male with h/o NICM s/p MDT ICD in 01/2012, nonobstructive CAD by multiple cardiac caths most recently x 2 in 2014, chronic Afib/flutter no longer on full-dose anticoagulation (unclear who stopped), history of frequent falls which have increased as of late leading to several ED visits which he was discharged home from the ED with all falls including this most recent occuring from positional change from sitting to standing, prior GIB, iron deficiency anemia, chronic combined CHF, gastritis, and HTN who presented to [facility] on 7/4 with another fall with elevated troponin (of note he has been noted to have elevated troponin in the setting of his falls in May 2017 without inpatient workup). Cardiology is consulted for further evaluation.   Patient underwent cardiac cath in 2010 that showed no significant CAD. Repeat cardiac caths in January and May of 2014 shoed no significant CAD. He underwent ICD implantation in 01/2012 for primary prevention with an EF of less than 30% at that time. EF has subsequently improved to 55-60% by most recent echo in 05/2014. He self reported an ICD shock back in October 2014. He has previously been admitted for acute GIB with BRBPR requiring transfusion of 3 units of PRBC. He has known poor balance with frequent falls. He has been seen in the ED recently on 06/2014, 07/2015, and now on 7/4 with mechanical falls.   This admission he noted he again suffered a fall after changing positions from sitting to standing. Never with any LOC with his falls. He has never had any chest pain, SOB, palpitations, nausea, vomiting, or  presyncope/syncope. He has not felt his ICD fire with any of the above. His diet is minimal and he does not push fluids.    Upon the patient's arrival to [facility] they were found to have a troponin of 0.10-->0.10-->0.12, SCr 0.89, K+ 4.3, hgb 15.5, WBC 8.6. ECG showed V paced rhythm, 76 bpm, CXR was not performed in the ED or at time of admission. Head CT/neck without acute process.   Past Medical History  Diagnosis Date  . Non-obstructive CAD     a. s/p multiple caths @ [facility] - last reportedly 06/2012;  b. 06/2013 Lexi MV: EF 51%, no ischemia/infarct.  Marland Kitchen NICM (nonischemic cardiomyopathy) (HCC)     a. EF reportedly < 30% 01/2013 ([facility]);  b. 01/2012 s/p MDT DTBB1D1 Viva S CRT-D;  c. 12/2012 Echo: EF 45-50%, inf/post HK, nl RV, mild MR.  . Diabetes mellitus without complication (HCC)   . A-fib (HCC)     a. Permanent, on Xarelto.  . H/O: GI bleed     a. 12/2012 EGD: HH, evidence of reflux. Erosive Gastritis. Nl duodenum;  b. 12/2012 Colonoscopy: Sev sigmoid and desc colon diverticulosis, transverse and ascending colon diverticulosis, nonbleeding internal hemorrhoids, no bleeding.  . Iron deficiency anemia   . Biventricular ICD (implantable cardioverter-defibrillator) -Medtronic 01/12/2013    a. 01/2012 [facility]:  01/2012 s/p MDT DTBB1D1 Viva S CRT-D.  Atrial port capped (afib).  . Diverticulosis     a. 12/2012 noted on colonoscopy.  . Hiatal hernia     a. 12/2012 noted on EGD.  Marland Kitchen Gastritis     a. 12/2012 noted on EGD.  Marland Kitchen  Hypertension   . AICD (automatic cardioverter/defibrillator) present   . Presence of permanent cardiac pacemaker       Most Recent Cardiac Studies: Echo 05/2014: EF 50-55%, mild to moderate LVH, abnormal septal wall motion 2/2 BBB/paced rhythm, unable to estimate LV diastolic function 2/2 Afib, normal RV systolic function, mildly dilated LA, mild MR   Surgical History:  Past Surgical History  Procedure Laterality Date  . Cardiac catheterization  2013    [facility]   .  Cardiac catheterization  2011  . Defib implant  01/27/2012    Serial # [id number] H Model # [id number]  . Pacemaker insertion  01/27/2012  . Colonoscopy  32014  . Upper gastrointestinal endoscopy  2014  . Fetal blood transfusion  2014  . Cardiac defibrillator placement    . Esophagogastroduodenoscopy (egd) with propofol N/A 08/29/2014    Procedure: ESOPHAGOGASTRODUODENOSCOPY (EGD) WITH PROPOFOL;  Surgeon: Elnita Maxwell, MD;  Location: [facility] ENDOSCOPY;  Service: Endoscopy;  Laterality: N/A;  . Colonoscopy with propofol N/A 08/29/2014    Procedure: COLONOSCOPY WITH PROPOFOL;  Surgeon: Elnita Maxwell, MD;  Location: [facility] ENDOSCOPY;  Service: Endoscopy;  Laterality: N/A;     Home Meds: Prior to Admission medications   Medication Sig Start Date End Date Taking? Authorizing Provider  acetaminophen (TYLENOL) 500 MG tablet Take 500 mg by mouth 3 (three) times daily.   Yes Historical Provider, MD  aspirin 81 MG chewable tablet Chew by mouth daily.   Yes Historical Provider, MD  atorvastatin (LIPITOR) 80 MG tablet Take 80 mg by mouth daily.   Yes Historical Provider, MD  betamethasone dipropionate (DIPROLENE) 0.05 % cream Apply 1 application topically 2 (two) times daily.   Yes Historical Provider, MD  buPROPion (WELLBUTRIN XL) 300 MG 24 hr tablet Take 300 mg by mouth daily.   Yes Historical Provider, MD  carvedilol (COREG) 3.125 MG tablet Take 3.125 mg by mouth 2 (two) times daily with a meal.   Yes Historical Provider, MD  digoxin (LANOXIN) 0.25 MG tablet Take 0.25 mg by mouth daily. 01/26/13  Yes Duke Salvia, MD  finasteride (PROSCAR) 5 MG tablet Take 5 mg by mouth daily.   Yes Historical Provider, MD  furosemide (LASIX) 20 MG tablet Take 20 mg by mouth daily.   Yes Historical Provider, MD  gabapentin (NEURONTIN) 600 MG tablet Take 600 mg by mouth 3 (three) times daily.   Yes Historical Provider, MD  lisinopril (PRINIVIL,ZESTRIL) 2.5 MG tablet Take 2.5 mg by mouth daily.   Yes Historical  Provider, MD  Melatonin 1 MG TABS Take 1 mg by mouth at bedtime.   Yes Historical Provider, MD  metFORMIN (GLUCOPHAGE) 500 MG tablet Take 1,000 mg by mouth 2 (two) times daily with a meal.    Yes Historical Provider, MD  pantoprazole (PROTONIX) 40 MG tablet Take 40 mg by mouth daily.    Yes Historical Provider, MD  potassium chloride SA (K-DUR,KLOR-CON) 20 MEQ tablet Take 20 mEq by mouth 2 (two) times daily.  02/17/15  Yes Historical Provider, MD  sertraline (ZOLOFT) 100 MG tablet Take 100 mg by mouth daily.   Yes Historical Provider, MD  tamsulosin (FLOMAX) 0.4 MG CAPS capsule Take 0.4 mg by mouth.   Yes Historical Provider, MD  traZODone (DESYREL) 100 MG tablet Take 200 mg by mouth at bedtime.   Yes Historical Provider, MD    Inpatient Medications:  . aspirin  81 mg Oral Daily  . atorvastatin  80 mg Oral Daily  . buPROPion  300 mg Oral Daily  . carvedilol  3.125 mg Oral BID WC  . digoxin  0.25 mg Oral Daily  . enoxaparin (LOVENOX) injection  40 mg Subcutaneous Q24H  . finasteride  5 mg Oral Daily  . furosemide  20 mg Oral Daily  . insulin aspart  0-5 Units Subcutaneous QHS  . insulin aspart  0-9 Units Subcutaneous TID WC  . lisinopril  2.5 mg Oral Daily  . pantoprazole  40 mg Oral Daily  . sertraline  100 mg Oral Daily  . sodium chloride flush  3 mL Intravenous Q12H  . tamsulosin  0.4 mg Oral Daily      Allergies: No Known Allergies  Social History   Social History  . Marital Status: Married    Spouse Name: N/A  . Number of Children: N/A  . Years of Education: N/A   Occupational History  . Not on file.   Social History Main Topics  . Smoking status: Former Smoker -- 3.00 packs/day for 45 years    Types: Cigarettes    Quit date: 01/13/2003  . Smokeless tobacco: Not on file  . Alcohol Use: No  . Drug Use: No  . Sexual Activity: Not on file   Other Topics Concern  . Not on file   Social History Narrative     Family History  Problem Relation Age of Onset  .  Family history unknown: Yes     Review of Systems: Review of Systems  Constitutional: Positive for malaise/fatigue. Negative for fever, chills, weight loss and diaphoresis.  HENT: Negative for congestion.   Eyes: Negative for discharge and redness.  Respiratory: Negative for cough, hemoptysis, sputum production, shortness of breath and wheezing.   Cardiovascular: Negative for chest pain, palpitations, orthopnea, claudication, leg swelling and PND.  Gastrointestinal: Negative for heartburn, nausea, vomiting, abdominal pain, blood in stool and melena.  Genitourinary: Negative for hematuria.  Musculoskeletal: Positive for falls. Negative for myalgias.  Skin: Negative for rash.  Neurological: Positive for dizziness. Negative for tingling, tremors, sensory change, speech change, focal weakness, loss of consciousness and weakness.  Endo/Heme/Allergies: Does not bruise/bleed easily.  Psychiatric/Behavioral: Negative for substance abuse. The patient is not nervous/anxious.   All other systems reviewed and are negative.   Labs:  Recent Labs  08/22/15 1814 08/22/15 2149 08/23/15 0228  TROPONINI 0.10* 0.10* 0.12*   Lab Results  Component Value Date   WBC 8.8 08/23/2015   HGB 15.3 08/23/2015   HCT 45.2 08/23/2015   MCV 80.3 08/23/2015   PLT 195 08/23/2015     Recent Labs Lab 08/22/15 1814 08/23/15 0228  NA 139  --   K 4.3  --   CL 102  --   CO2 29  --   BUN 10  --   CREATININE 0.89 0.79  CALCIUM 9.3  --   GLUCOSE 108*  --    Lab Results  Component Value Date   CHOL 126 05/30/2014   HDL 25* 05/30/2014   LDLCALC 80 05/30/2014   TRIG 105 05/30/2014   No results found for: DDIMER  Radiology/Studies:  Dg Chest 2 View  08/08/2015  CLINICAL DATA:  Seven 71-year-old male with fall on back pain EXAM: CHEST  2 VIEW COMPARISON:  Chest radiograph dated 06/22/2015 FINDINGS: Two views of the chest do not demonstrate a focal consolidation. There is no pleural effusion or  pneumothorax. Stable cardiomegaly. Left pectoral AICD device. Old healed left clavicular fracture. No acute fracture. IMPRESSION: No active cardiopulmonary disease. Electronically Signed  By: Elgie Collard M.D.   On: 08/08/2015 01:34   Dg Lumbar Spine 2-3 Views  08/08/2015  CLINICAL DATA:  Seven 50-year-old male with fall and back pain. EXAM: LUMBAR SPINE - 2-3 VIEW COMPARISON:  Abdominal CT dated 04/11/2015 FINDINGS: There is no definite acute fracture or subluxation of the lumbar spine. There is osteopenia with multilevel degenerative changes. Multilevel disc desiccation with vacuum phenomena noted. The visualized transverse and spinous processes appear intact. There is atherosclerotic calcification of the visualized abdominal aorta. The soft tissues are grossly unremarkable. IMPRESSION: No acute/traumatic lumbar spine pathology. Electronically Signed   By: Elgie Collard M.D.   On: 08/08/2015 01:33   Ct Head Wo Contrast  08/22/2015  CLINICAL DATA:  Pain and dizziness following fall EXAM: CT HEAD WITHOUT CONTRAST CT CERVICAL SPINE WITHOUT CONTRAST TECHNIQUE: Multidetector CT imaging of the head and cervical spine was performed following the standard protocol without intravenous contrast. Multiplanar CT image reconstructions of the cervical spine were also generated. COMPARISON:  Cervical spine CT April 11, 2015; head CT August 08, 2015 FINDINGS: CT HEAD FINDINGS There is mild diffuse atrophy. There is no intracranial mass, hemorrhage, extra-axial fluid collection, or midline shift. There is patchy small vessel disease in the centra semiovale bilaterally. Elsewhere gray-white compartments appear normal. No acute infarct is evident. There are foci of calcification in both cavernous carotid arteries. The bony calvarium appears intact. Mastoid air cells are clear although somewhat hypoplastic on the right, stable. There is extensive mucosal thickening throughout much of the left maxillary antrum with  calcification in this inspissated mucus material in the left maxillary antrum. Visualized orbits appear symmetric bilaterally. CT CERVICAL SPINE FINDINGS There is no demonstrable fracture or spondylolisthesis. Prevertebral soft tissues are normal. There is arthropathy with marked narrowing in the predental space region. There is moderately severe disc space narrowing at C5-6 and C6-7. There is milder narrowing at C7-T1. There are prominent anterior osteophytes at C4, C5, C6, and C7. There is facet hypertrophy at most levels bilaterally. There is moderate exit foraminal narrowing at multiple levels bilaterally. There is no frank disc extrusion or stenosis. Note that there is calcification in the posterior longitudinal ligament at C5-6. There is fairly extensive carotid artery calcification bilaterally, more pronounced on the right than on the left. IMPRESSION: CT head: Atrophy with patchy periventricular small vessel disease. No intracranial mass, hemorrhage, or extra-axial fluid collection. No acute infarct evident. There are foci of arterial vascular calcification. There is extensive mucosal thickening in the left maxillary antrum with calcification within this mucosal thickening. Calcification of this nature has been associated with fungal colonization within the respective sinus. CT cervical spine: No acute fracture or spondylolisthesis. Multilevel arthropathy. Extensive carotid artery calcification bilaterally, more pronounced on the right than on the left. Electronically Signed   By: Bretta Bang III M.D.   On: 08/22/2015 19:43   Ct Head Wo Contrast  08/08/2015  CLINICAL DATA:  82 year old male with fall EXAM: CT HEAD WITHOUT CONTRAST TECHNIQUE: Contiguous axial images were obtained from the base of the skull through the vertex without intravenous contrast. COMPARISON:  Head CT dated 04/11/2015 FINDINGS: The ventricles and sulci are appropriate in size for patient's age. Minimal periventricular and deep  white matter chronic microvascular ischemic changes noted. There is no acute intracranial hemorrhage. No mass effect or midline shift. There is mild mucoperiosteal thickening of the paranasal sinuses. There is partial opacification of the left maxillary sinus, chronic. No air-fluid level. The mastoid air cells are clear. The  calvarium is intact. IMPRESSION: No acute intracranial hemorrhage. Electronically Signed   By: Elgie Collard M.D.   On: 08/08/2015 01:41   Ct Cervical Spine Wo Contrast  08/22/2015  CLINICAL DATA:  Pain and dizziness following fall EXAM: CT HEAD WITHOUT CONTRAST CT CERVICAL SPINE WITHOUT CONTRAST TECHNIQUE: Multidetector CT imaging of the head and cervical spine was performed following the standard protocol without intravenous contrast. Multiplanar CT image reconstructions of the cervical spine were also generated. COMPARISON:  Cervical spine CT April 11, 2015; head CT August 08, 2015 FINDINGS: CT HEAD FINDINGS There is mild diffuse atrophy. There is no intracranial mass, hemorrhage, extra-axial fluid collection, or midline shift. There is patchy small vessel disease in the centra semiovale bilaterally. Elsewhere gray-white compartments appear normal. No acute infarct is evident. There are foci of calcification in both cavernous carotid arteries. The bony calvarium appears intact. Mastoid air cells are clear although somewhat hypoplastic on the right, stable. There is extensive mucosal thickening throughout much of the left maxillary antrum with calcification in this inspissated mucus material in the left maxillary antrum. Visualized orbits appear symmetric bilaterally. CT CERVICAL SPINE FINDINGS There is no demonstrable fracture or spondylolisthesis. Prevertebral soft tissues are normal. There is arthropathy with marked narrowing in the predental space region. There is moderately severe disc space narrowing at C5-6 and C6-7. There is milder narrowing at C7-T1. There are prominent anterior  osteophytes at C4, C5, C6, and C7. There is facet hypertrophy at most levels bilaterally. There is moderate exit foraminal narrowing at multiple levels bilaterally. There is no frank disc extrusion or stenosis. Note that there is calcification in the posterior longitudinal ligament at C5-6. There is fairly extensive carotid artery calcification bilaterally, more pronounced on the right than on the left. IMPRESSION: CT head: Atrophy with patchy periventricular small vessel disease. No intracranial mass, hemorrhage, or extra-axial fluid collection. No acute infarct evident. There are foci of arterial vascular calcification. There is extensive mucosal thickening in the left maxillary antrum with calcification within this mucosal thickening. Calcification of this nature has been associated with fungal colonization within the respective sinus. CT cervical spine: No acute fracture or spondylolisthesis. Multilevel arthropathy. Extensive carotid artery calcification bilaterally, more pronounced on the right than on the left. Electronically Signed   By: Bretta Bang III M.D.   On: 08/22/2015 19:43   Dg Hip Unilat With Pelvis 2-3 Views Right  08/08/2015  CLINICAL DATA:  82 year old male with fall and back pain. EXAM: DG HIP (WITH OR WITHOUT PELVIS) 2-3V RIGHT COMPARISON:  None. FINDINGS: There is no acute fracture or dislocation of the right hip. There are bilateral osteoarthritic changes of the hips as well as degenerative changes of the visualized lower lumbar spine. The soft tissues appear unremarkable. IMPRESSION: No acute fracture or dislocation. Electronically Signed   By: Elgie Collard M.D.   On: 08/08/2015 01:29    EKG: Interpreted by me showed: V paced rhythm 76 bpm Telemetry: Interpreted by me showed: V paced rhythm, 60's bpm, occasional PVCs  Weights: Filed Weights   08/22/15 1806 08/23/15 0200  Weight: 232 lb (105.235 kg) 210 lb 8 oz (95.482 kg)     Physical Exam: Blood pressure 158/83,  pulse 85, temperature 98.6 F (37 C), temperature source Oral, resp. rate 20, height 6\' 2"  (1.88 m), weight 210 lb 8 oz (95.482 kg), SpO2 98 %. Body mass index is 27.02 kg/(m^2). General: Well developed, well nourished, in no acute distress. Head: Normocephalic, atraumatic, sclera non-icteric, no xanthomas, nares are  without discharge.  Neck: Negative for carotid bruits. JVD not elevated. Lungs: Clear bilaterally to auscultation without wheezes, rales, or rhonchi. Breathing is unlabored. Heart: Regular with S1 S2. No murmurs, rubs, or gallops appreciated. Abdomen: Soft, non-tender, non-distended with normoactive bowel sounds. No hepatomegaly. No rebound/guarding. No obvious abdominal masses. Msk:  Strength and tone appear normal for age. Extremities: No clubbing or cyanosis. No edema. Distal pedal pulses are 2+ and equal bilaterally. Neuro: Alert and oriented X 3. No facial asymmetry. No focal deficit. Moves all extremities spontaneously. Psych:  Responds to questions appropriately with a normal affect.    Assessment and Plan:  Principal Problem:   Frequent falls Active Problems:   Unsteady gait   Atrial fibrillation-permanent   Chronic combined systolic and diastolic CHF (congestive heart failure) (HCC)   NICM (nonischemic cardiomyopathy) (HCC)   Biventricular ICD (implantable cardioverter-defibrillator) -Medtronic   Non-obstructive CAD   Elevated troponin   GI bleed   Diabetes mellitus without complication (HCC)    1. Frequent falls/unsteady gait/orthostatic hypotension/known vertigo: -All of the patient's falls, dating back to at least January 2017 have occurred with the patient changing positions from sitting to standing -He does not eat or drink large volumes -Never with any preceding symptoms concerning for cardiac etiology -Orthostatics not done in the ED or upon admission -Will order orthostatics -He likes to move fast and complains his walker slows him down -Patient will  need to change positions more slowly -If symptoms persist could consider medication -Check echo  2. Elevated troponin: -Of uncertain significance as the patient never had any cardiac symptoms concerning for angina  -Echo as above -Multiple cardiac caths showing no significant CAD -No plans for inpatient ischemic workup if echo is stable  3. Chronic Afib: -Currently V paced on tele -Has been off full-dose anticoagulation since at least January 2017 when his PCP stopped this 2/2 frequent falls as above -Continue to hold anticoagulation given his frequent falls -CHADS2VASc at least 5 (CHF, HTN, age x 2, DM)  4. NICM s/p BiV ICD: -Device appears to be functioning well on tele -Follow up per EP  5. Nonobstructive CAD by multiple cardiac caths: -As above -Continue current medications  6. Prior GI bleed: -HGB stable  7. HTN: -Modestly controlled -Hesitant to start/titrate antihypertensives given #1 -Monitor, treat accordingly    Signed, Eula Listen, PA-C [facility] Pager: [phone] 08/23/2015, 9:11 AM

## 2015-08-23 NOTE — Progress Notes (Signed)
[facility] - [place] at [facility]   PATIENT NAME: Nicolas Morris    MR#:  [id number]  DATE OF BIRTH:  09/01/1933  SUBJECTIVE:  CHIEF COMPLAINT:   Chief Complaint  Patient presents with  . Fall  . Weakness     Came with frequent falls. Said- he also had frequent urinations for last few days.   He have ch afib and was taken off from anticoagulation due to falls.   Troponin was high, stable on tele.  REVIEW OF SYSTEMS:  CONSTITUTIONAL: No fever, fatigue or weakness.  EYES: No blurred or double vision.  EARS, NOSE, AND THROAT: No tinnitus or ear pain.  RESPIRATORY: No cough, shortness of breath, wheezing or hemoptysis.  CARDIOVASCULAR: No chest pain, orthopnea, edema.  GASTROINTESTINAL: No nausea, vomiting, diarrhea or abdominal pain.  GENITOURINARY: No dysuria, hematuria.  ENDOCRINE: No polyuria, nocturia, have increased frequency. HEMATOLOGY: No anemia, easy bruising or bleeding SKIN: No rash or lesion. MUSCULOSKELETAL: No joint pain or arthritis.   NEUROLOGIC: No tingling, numbness, have weakness, but frequent falls.  PSYCHIATRY: No anxiety or depression.   ROS  DRUG ALLERGIES:  No Known Allergies  VITALS:  Blood pressure 145/72, pulse 80, temperature 98.4 F (36.9 C), temperature source Oral, resp. rate 20, height 6\' 2"  (1.88 m), weight 95.482 kg (210 lb 8 oz), SpO2 98 %.  PHYSICAL EXAMINATION:  GENERAL:  82 y.o.-year-old patient lying in the bed with no acute distress.  EYES: Pupils equal, round, reactive to light and accommodation. No scleral icterus. Extraocular muscles intact.  HEENT: Head atraumatic, normocephalic. Oropharynx and nasopharynx clear.  NECK:  Supple, no jugular venous distention. No thyroid enlargement, no tenderness.  LUNGS: Normal breath sounds bilaterally, no wheezing, rales,rhonchi or crepitation. No use of accessory muscles of respiration.  CARDIOVASCULAR: S1, S2 normal. No murmurs, rubs, or gallops.  ABDOMEN: Soft, nontender,  nondistended. Bowel sounds present. No organomegaly or mass.  EXTREMITIES: No pedal edema, cyanosis, or clubbing.  NEUROLOGIC: Cranial nerves II through XII are intact. Muscle strength 5/5 in all extremities. Sensation intact. Gait not checked.  PSYCHIATRIC: The patient is alert and oriented x 3.  SKIN: No obvious rash, lesion, or ulcer.   Physical Exam LABORATORY PANEL:   CBC  Recent Labs Lab 08/23/15 0831  WBC 8.8  HGB 15.3  HCT 45.2  PLT 195   ------------------------------------------------------------------------------------------------------------------  Chemistries   Recent Labs Lab 08/23/15 0831  NA 139  K 4.1  CL 102  CO2 29  GLUCOSE 110*  BUN 15  CREATININE 0.87  CALCIUM 9.0   ------------------------------------------------------------------------------------------------------------------  Cardiac Enzymes  Recent Labs Lab 08/23/15 0228 08/23/15 0831  TROPONINI 0.12* 0.15*   ------------------------------------------------------------------------------------------------------------------  RADIOLOGY:  Ct Head Wo Contrast  08/22/2015  CLINICAL DATA:  Pain and dizziness following fall EXAM: CT HEAD WITHOUT CONTRAST CT CERVICAL SPINE WITHOUT CONTRAST TECHNIQUE: Multidetector CT imaging of the head and cervical spine was performed following the standard protocol without intravenous contrast. Multiplanar CT image reconstructions of the cervical spine were also generated. COMPARISON:  Cervical spine CT April 11, 2015; head CT August 08, 2015 FINDINGS: CT HEAD FINDINGS There is mild diffuse atrophy. There is no intracranial mass, hemorrhage, extra-axial fluid collection, or midline shift. There is patchy small vessel disease in the centra semiovale bilaterally. Elsewhere gray-white compartments appear normal. No acute infarct is evident. There are foci of calcification in both cavernous carotid arteries. The bony calvarium appears intact. Mastoid air cells are clear  although somewhat hypoplastic on the right, stable. There  is extensive mucosal thickening throughout much of the left maxillary antrum with calcification in this inspissated mucus material in the left maxillary antrum. Visualized orbits appear symmetric bilaterally. CT CERVICAL SPINE FINDINGS There is no demonstrable fracture or spondylolisthesis. Prevertebral soft tissues are normal. There is arthropathy with marked narrowing in the predental space region. There is moderately severe disc space narrowing at C5-6 and C6-7. There is milder narrowing at C7-T1. There are prominent anterior osteophytes at C4, C5, C6, and C7. There is facet hypertrophy at most levels bilaterally. There is moderate exit foraminal narrowing at multiple levels bilaterally. There is no frank disc extrusion or stenosis. Note that there is calcification in the posterior longitudinal ligament at C5-6. There is fairly extensive carotid artery calcification bilaterally, more pronounced on the right than on the left. IMPRESSION: CT head: Atrophy with patchy periventricular small vessel disease. No intracranial mass, hemorrhage, or extra-axial fluid collection. No acute infarct evident. There are foci of arterial vascular calcification. There is extensive mucosal thickening in the left maxillary antrum with calcification within this mucosal thickening. Calcification of this nature has been associated with fungal colonization within the respective sinus. CT cervical spine: No acute fracture or spondylolisthesis. Multilevel arthropathy. Extensive carotid artery calcification bilaterally, more pronounced on the right than on the left. Electronically Signed   By: Bretta Bang III M.D.   On: 08/22/2015 19:43   Ct Cervical Spine Wo Contrast  08/22/2015  CLINICAL DATA:  Pain and dizziness following fall EXAM: CT HEAD WITHOUT CONTRAST CT CERVICAL SPINE WITHOUT CONTRAST TECHNIQUE: Multidetector CT imaging of the head and cervical spine was performed  following the standard protocol without intravenous contrast. Multiplanar CT image reconstructions of the cervical spine were also generated. COMPARISON:  Cervical spine CT April 11, 2015; head CT August 08, 2015 FINDINGS: CT HEAD FINDINGS There is mild diffuse atrophy. There is no intracranial mass, hemorrhage, extra-axial fluid collection, or midline shift. There is patchy small vessel disease in the centra semiovale bilaterally. Elsewhere gray-white compartments appear normal. No acute infarct is evident. There are foci of calcification in both cavernous carotid arteries. The bony calvarium appears intact. Mastoid air cells are clear although somewhat hypoplastic on the right, stable. There is extensive mucosal thickening throughout much of the left maxillary antrum with calcification in this inspissated mucus material in the left maxillary antrum. Visualized orbits appear symmetric bilaterally. CT CERVICAL SPINE FINDINGS There is no demonstrable fracture or spondylolisthesis. Prevertebral soft tissues are normal. There is arthropathy with marked narrowing in the predental space region. There is moderately severe disc space narrowing at C5-6 and C6-7. There is milder narrowing at C7-T1. There are prominent anterior osteophytes at C4, C5, C6, and C7. There is facet hypertrophy at most levels bilaterally. There is moderate exit foraminal narrowing at multiple levels bilaterally. There is no frank disc extrusion or stenosis. Note that there is calcification in the posterior longitudinal ligament at C5-6. There is fairly extensive carotid artery calcification bilaterally, more pronounced on the right than on the left. IMPRESSION: CT head: Atrophy with patchy periventricular small vessel disease. No intracranial mass, hemorrhage, or extra-axial fluid collection. No acute infarct evident. There are foci of arterial vascular calcification. There is extensive mucosal thickening in the left maxillary antrum with  calcification within this mucosal thickening. Calcification of this nature has been associated with fungal colonization within the respective sinus. CT cervical spine: No acute fracture or spondylolisthesis. Multilevel arthropathy. Extensive carotid artery calcification bilaterally, more pronounced on the right than on the left.  Electronically Signed   By: Bretta Bang III M.D.   On: 08/22/2015 19:43    ASSESSMENT AND PLAN:   Principal Problem:   Frequent falls Active Problems:   Atrial fibrillation-permanent   GI bleed   Chronic combined systolic and diastolic CHF (congestive heart failure) (HCC)   NICM (nonischemic cardiomyopathy) (HCC)   Diabetes mellitus without complication (HCC)   Biventricular ICD (implantable cardioverter-defibrillator) -[employer]   Non-obstructive CAD   Unsteady gait   Elevated troponin   Fall  * Frequent falls - unclear etiology. Potentially related to cardiac process, or potentially related to when his medications used to treat his cardiac pathology.  observation on telemetry, UA negative.  Pt is seen by cardiology, awaited echocardiogram result.  May have stroke? have A fib and not taking Anticoagulants.   Will call neurology.    Can not do MRI as he have ICD.  * Elevated troponin - elevated to 0.1,   Likely nonspecific.   * Atrial fibrillation-permanent - status post ICD and pacemaker. His rhythm is currently A. Fib-paced.    Anticoagulants taken off due to falls in past.  * Chronic combined systolic and diastolic CHF (congestive heart failure) (HCC) - continue home meds, does not seem to be in exacerbation at this time   * Diabetes mellitus without complication (HCC) - sliding scale insulin with corresponding glucose checks and a carb modified diet   All the records are reviewed and case discussed with Care Management/Social Workerr. Management plans discussed with the patient, family and they are in agreement.  CODE STATUS: DNR  TOTAL  TIME TAKING CARE OF THIS PATIENT: 35 minutes.  Not seem to be cardiac reason/ Awaited PT eval, and Neurology consult to decide discharge plan.   POSSIBLE D/C IN 1-2 DAYS, DEPENDING ON CLINICAL CONDITION.   Altamese Dilling M.D on 08/23/2015   Between 7am to 6pm - Pager - [phone]  After 6pm go to [email or web address] - Social research officer, government  [facility]  Office  [phone]  CC: Primary care physician; WHITE, Arlyss Repress, NP  Note: This dictation was prepared with Dragon dictation along with smaller phrase technology. Any transcriptional errors that result from this process are unintentional.

## 2015-08-23 NOTE — Plan of Care (Signed)
Problem: Safety: Goal: Ability to remain free from injury will improve Outcome: Not Met (add Reason) Patient has had several of falls, bruises noticeable.

## 2015-08-24 DIAGNOSIS — R42 Dizziness and giddiness: Secondary | ICD-10-CM | POA: Diagnosis not present

## 2015-08-24 DIAGNOSIS — R296 Repeated falls: Secondary | ICD-10-CM

## 2015-08-24 DIAGNOSIS — I4891 Unspecified atrial fibrillation: Secondary | ICD-10-CM | POA: Diagnosis not present

## 2015-08-24 LAB — GLUCOSE, CAPILLARY
GLUCOSE-CAPILLARY: 151 mg/dL — AB (ref 65–99)
Glucose-Capillary: 111 mg/dL — ABNORMAL HIGH (ref 65–99)

## 2015-08-24 LAB — VITAMIN B12: VITAMIN B 12: 135 pg/mL — AB (ref 180–914)

## 2015-08-24 NOTE — Consult Note (Signed)
Reason for Consult:Frequent falls Referring Physician: Allena Katz  CC: Frequent falls  HPI: Nicolas Morris is an 82 y.o. male admitted with frequent falls.  He reports that he becomes dizzy and loses his balance.  Does not describe vertigo but lightheadedness.  Has not had any episodes of syncope.  Patient with history of atrial fibrillation but concern is for safety with anticoagulation due to falls.     Past Medical History  Diagnosis Date  . Non-obstructive CAD     a. s/p multiple caths @ [facility] - last reportedly 06/2012;  b. 06/2013 Lexi MV: EF 51%, no ischemia/infarct.  Marland Kitchen NICM (nonischemic cardiomyopathy) (HCC)     a. EF reportedly < 30% 01/2013 ([facility]);  b. 01/2012 s/p MDT DTBB1D1 Viva S CRT-D;  c. 12/2012 Echo: EF 45-50%, inf/post HK, nl RV, mild MR.  . Diabetes mellitus without complication (HCC)   . A-fib (HCC)     a. Permanent, on Xarelto.  . H/O: GI bleed     a. 12/2012 EGD: HH, evidence of reflux. Erosive Gastritis. Nl duodenum;  b. 12/2012 Colonoscopy: Sev sigmoid and desc colon diverticulosis, transverse and ascending colon diverticulosis, nonbleeding internal hemorrhoids, no bleeding.  . Iron deficiency anemia   . Biventricular ICD (implantable cardioverter-defibrillator) -Medtronic 01/12/2013    a. 01/2012 [facility]:  01/2012 s/p MDT DTBB1D1 Viva S CRT-D.  Atrial port capped (afib).  . Diverticulosis     a. 12/2012 noted on colonoscopy.  . Hiatal hernia     a. 12/2012 noted on EGD.  Marland Kitchen Gastritis     a. 12/2012 noted on EGD.  Marland Kitchen Hypertension   . AICD (automatic cardioverter/defibrillator) present   . Presence of permanent cardiac pacemaker     Past Surgical History  Procedure Laterality Date  . Cardiac catheterization  2013    [facility]   . Cardiac catheterization  2011  . Defib implant  01/27/2012    Serial # [id number] H Model # [id number]  . Pacemaker insertion  01/27/2012  . Colonoscopy  32014  . Upper gastrointestinal endoscopy  2014  . Fetal blood transfusion  2014  .  Cardiac defibrillator placement    . Esophagogastroduodenoscopy (egd) with propofol N/A 08/29/2014    Procedure: ESOPHAGOGASTRODUODENOSCOPY (EGD) WITH PROPOFOL;  Surgeon: Elnita Maxwell, MD;  Location: [facility] ENDOSCOPY;  Service: Endoscopy;  Laterality: N/A;  . Colonoscopy with propofol N/A 08/29/2014    Procedure: COLONOSCOPY WITH PROPOFOL;  Surgeon: Elnita Maxwell, MD;  Location: [facility] ENDOSCOPY;  Service: Endoscopy;  Laterality: N/A;    Family History         Brother with alcohol abuse.  Son with prostate cancer.  Sisters with breast cancer, DM, HTN, and thyroid disease.    Social History:  reports that he quit smoking about 12 years ago. His smoking use included Cigarettes. He has a 135 pack-year smoking history. He does not have any smokeless tobacco history on file. He reports that he does not drink alcohol or use illicit drugs.  No Known Allergies  Medications:  I have reviewed the patient's current medications. Prior to Admission:  Prescriptions prior to admission  Medication Sig Dispense Refill Last Dose  . acetaminophen (TYLENOL) 500 MG tablet Take 500 mg by mouth 3 (three) times daily.   08/21/2015 at Unknown time  . aspirin 81 MG chewable tablet Chew by mouth daily.   08/21/2015 at Unknown time  . atorvastatin (LIPITOR) 80 MG tablet Take 80 mg by mouth daily.   08/21/2015 at Unknown time  .  betamethasone dipropionate (DIPROLENE) 0.05 % cream Apply 1 application topically 2 (two) times daily.   08/21/2015 at Unknown time  . buPROPion (WELLBUTRIN XL) 300 MG 24 hr tablet Take 300 mg by mouth daily.   08/21/2015 at Unknown time  . carvedilol (COREG) 3.125 MG tablet Take 3.125 mg by mouth 2 (two) times daily with a meal.   08/21/2015 at Unknown time  . digoxin (LANOXIN) 0.25 MG tablet Take 0.25 mg by mouth daily.   08/21/2015 at Unknown time  . finasteride (PROSCAR) 5 MG tablet Take 5 mg by mouth daily.   08/21/2015 at Unknown time  . furosemide (LASIX) 20 MG tablet Take 20 mg by mouth  daily.   08/21/2015 at Unknown time  . gabapentin (NEURONTIN) 600 MG tablet Take 600 mg by mouth 3 (three) times daily.   08/21/2015 at Unknown time  . lisinopril (PRINIVIL,ZESTRIL) 2.5 MG tablet Take 2.5 mg by mouth daily.   08/21/2015 at Unknown time  . Melatonin 1 MG TABS Take 1 mg by mouth at bedtime.   08/21/2015 at Unknown time  . metFORMIN (GLUCOPHAGE) 500 MG tablet Take 1,000 mg by mouth 2 (two) times daily with a meal.    08/21/2015 at Unknown time  . pantoprazole (PROTONIX) 40 MG tablet Take 40 mg by mouth daily.    08/21/2015 at Unknown time  . potassium chloride SA (K-DUR,KLOR-CON) 20 MEQ tablet Take 20 mEq by mouth 2 (two) times daily.    08/21/2015 at Unknown time  . sertraline (ZOLOFT) 100 MG tablet Take 100 mg by mouth daily.   08/21/2015 at Unknown time  . tamsulosin (FLOMAX) 0.4 MG CAPS capsule Take 0.4 mg by mouth.   08/21/2015 at Unknown time  . traZODone (DESYREL) 100 MG tablet Take 200 mg by mouth at bedtime.   08/21/2015 at Unknown time   Scheduled: . aspirin  81 mg Oral Daily  . atorvastatin  80 mg Oral Daily  . buPROPion  300 mg Oral Daily  . carvedilol  3.125 mg Oral BID WC  . digoxin  0.25 mg Oral Daily  . enoxaparin (LOVENOX) injection  40 mg Subcutaneous Q24H  . finasteride  5 mg Oral Daily  . furosemide  20 mg Oral Daily  . insulin aspart  0-5 Units Subcutaneous QHS  . insulin aspart  0-9 Units Subcutaneous TID WC  . lisinopril  2.5 mg Oral Daily  . pantoprazole  40 mg Oral Daily  . sertraline  100 mg Oral Daily  . sodium chloride flush  3 mL Intravenous Q12H  . tamsulosin  0.4 mg Oral Daily    ROS: History obtained from the patient  General ROS: negative for - chills, fatigue, fever, night sweats, weight gain or weight loss Psychological ROS: negative for - behavioral disorder, hallucinations, memory difficulties, mood swings or suicidal ideation Ophthalmic ROS: negative for - blurry vision, double vision, eye pain or loss of vision ENT ROS: negative for - epistaxis,  nasal discharge, oral lesions, sore throat, tinnitus Allergy and Immunology ROS: negative for - hives or itchy/watery eyes Hematological and Lymphatic ROS: negative for - bleeding problems, bruising or swollen lymph nodes Endocrine ROS: negative for - galactorrhea, hair pattern changes, polydipsia/polyuria or temperature intolerance Respiratory ROS: negative for - cough, hemoptysis, shortness of breath or wheezing Cardiovascular ROS: negative for - chest pain, dyspnea on exertion, edema or irregular heartbeat Gastrointestinal ROS: negative for - abdominal pain, diarrhea, hematemesis, nausea/vomiting or stool incontinence Genito-Urinary ROS: negative for - dysuria, hematuria, incontinence or urinary  frequency/urgency Musculoskeletal ROS: negative for - joint swelling or muscular weakness Neurological ROS: as noted in HPI Dermatological ROS: negative for rash and skin lesion changes  Physical Examination: Blood pressure 140/73, pulse 83, temperature 97.8 F (36.6 C), temperature source Oral, resp. rate 20, height 6\' 2"  (1.88 m), weight 95.482 kg (210 lb 8 oz), SpO2 95 %.  HEENT-  Normocephalic, no lesions, without obvious abnormality.  Normal external eye and conjunctiva.  Normal TM's bilaterally.  Normal auditory canals and external ears. Normal external nose, mucus membranes and septum.  Normal pharynx. Cardiovascular- S1, S2 normal, pulses palpable throughout   Lungs- chest clear, no wheezing, rales, normal symmetric air entry Abdomen- soft, non-tender; bowel sounds normal; no masses,  no organomegaly Extremities- no edema Lymph-no adenopathy palpable Musculoskeletal-no joint tenderness, deformity or swelling Skin-warm and dry, no hyperpigmentation, vitiligo, or suspicious lesions  Neurological Examination Mental Status: Alert, oriented, thought content appropriate.  Speech fluent without evidence of aphasia.  Able to follow 3 step commands without difficulty. Cranial Nerves: II: Discs  flat bilaterally; Visual fields grossly normal, pupils equal, round, reactive to light and accommodation III,IV, VI: ptosis not present, extra-ocular motions intact bilaterally V,VII: smile symmetric, facial light touch sensation normal bilaterally VIII: hearing normal bilaterally IX,X: gag reflex present XI: bilateral shoulder shrug XII: midline tongue extension Motor: Right : Upper extremity   5/5    Left:     Upper extremity   5/5  Lower extremity   5/5     Lower extremity   5/5 Tone and bulk:normal tone throughout; no atrophy noted Sensory: Pinprick and light touch intact throughout, bilaterally.  Decreased vibratory sensation to the knees bilaterally Deep Tendon Reflexes: 2+ in the UE's, 1+ at the knees and absent at the ankles Plantars: Right: equivocal   Left: downgoing Cerebellar: Normal finger-to-nose testing with dysmetria on heel-to-shin testing bilaterally Gait: not tested due to safety concerns   Laboratory Studies:   Basic Metabolic Panel:  Recent Labs Lab 08/22/15 1814 08/23/15 0228 08/23/15 0831  NA 139  --  139  K 4.3  --  4.1  CL 102  --  102  CO2 29  --  29  GLUCOSE 108*  --  110*  BUN 10  --  15  CREATININE 0.89 0.79 0.87  CALCIUM 9.3  --  9.0    Liver Function Tests: No results for input(s): AST, ALT, ALKPHOS, BILITOT, PROT, ALBUMIN in the last 168 hours. No results for input(s): LIPASE, AMYLASE in the last 168 hours. No results for input(s): AMMONIA in the last 168 hours.  CBC:  Recent Labs Lab 08/22/15 1814 08/23/15 0228 08/23/15 0831  WBC 8.6 9.6 8.8  HGB 15.5 15.2 15.3  HCT 47.7 45.9 45.2  MCV 81.7 80.6 80.3  PLT 212 191 195    Cardiac Enzymes:  Recent Labs Lab 08/22/15 1814 08/22/15 2149 08/23/15 0228 08/23/15 0831 08/23/15 1445  TROPONINI 0.10* 0.10* 0.12* 0.15* 0.11*    BNP: Invalid input(s): POCBNP  CBG:  Recent Labs Lab 08/23/15 1140 08/23/15 1630 08/23/15 2101 08/24/15 0727 08/24/15 1111  GLUCAP 106* 117*  117* 111* 151*    Microbiology: Results for orders placed or performed during the hospital encounter of 08/22/15  MRSA PCR Screening     Status: None   Collection Time: 08/23/15  7:49 AM  Result Value Ref Range Status   MRSA by PCR NEGATIVE NEGATIVE Final    Comment:        The GeneXpert MRSA Assay (FDA approved  for NASAL specimens only), is one component of a comprehensive MRSA colonization surveillance program. It is not intended to diagnose MRSA infection nor to guide or monitor treatment for MRSA infections.     Coagulation Studies: No results for input(s): LABPROT, INR in the last 72 hours.  Urinalysis:  Recent Labs Lab 08/22/15 1814 08/23/15 1303  COLORURINE YELLOW* STRAW*  LABSPEC 1.016 1.005  PHURINE 6.0 7.0  GLUCOSEU NEGATIVE NEGATIVE  HGBUR 2+* 3+*  BILIRUBINUR NEGATIVE NEGATIVE  KETONESUR NEGATIVE NEGATIVE  PROTEINUR NEGATIVE NEGATIVE  NITRITE NEGATIVE NEGATIVE  LEUKOCYTESUR NEGATIVE NEGATIVE    Lipid Panel:     Component Value Date/Time   CHOL 126 05/30/2014 0452   TRIG 105 05/30/2014 0452   HDL 25* 05/30/2014 0452   VLDL 21 05/30/2014 0452   LDLCALC 80 05/30/2014 0452    HgbA1C:  Lab Results  Component Value Date   HGBA1C 5.8 08/23/2015    Urine Drug Screen:     Component Value Date/Time   LABOPIA NONE DETECTED 06/22/2015 1703   COCAINSCRNUR NONE DETECTED 06/22/2015 1703   LABBENZ NONE DETECTED 06/22/2015 1703   AMPHETMU NONE DETECTED 06/22/2015 1703   THCU NONE DETECTED 06/22/2015 1703   LABBARB NONE DETECTED 06/22/2015 1703    Alcohol Level: No results for input(s): ETH in the last 168 hours.   Imaging: Ct Head Wo Contrast  08/22/2015  CLINICAL DATA:  Pain and dizziness following fall EXAM: CT HEAD WITHOUT CONTRAST CT CERVICAL SPINE WITHOUT CONTRAST TECHNIQUE: Multidetector CT imaging of the head and cervical spine was performed following the standard protocol without intravenous contrast. Multiplanar CT image reconstructions of  the cervical spine were also generated. COMPARISON:  Cervical spine CT April 11, 2015; head CT August 08, 2015 FINDINGS: CT HEAD FINDINGS There is mild diffuse atrophy. There is no intracranial mass, hemorrhage, extra-axial fluid collection, or midline shift. There is patchy small vessel disease in the centra semiovale bilaterally. Elsewhere gray-white compartments appear normal. No acute infarct is evident. There are foci of calcification in both cavernous carotid arteries. The bony calvarium appears intact. Mastoid air cells are clear although somewhat hypoplastic on the right, stable. There is extensive mucosal thickening throughout much of the left maxillary antrum with calcification in this inspissated mucus material in the left maxillary antrum. Visualized orbits appear symmetric bilaterally. CT CERVICAL SPINE FINDINGS There is no demonstrable fracture or spondylolisthesis. Prevertebral soft tissues are normal. There is arthropathy with marked narrowing in the predental space region. There is moderately severe disc space narrowing at C5-6 and C6-7. There is milder narrowing at C7-T1. There are prominent anterior osteophytes at C4, C5, C6, and C7. There is facet hypertrophy at most levels bilaterally. There is moderate exit foraminal narrowing at multiple levels bilaterally. There is no frank disc extrusion or stenosis. Note that there is calcification in the posterior longitudinal ligament at C5-6. There is fairly extensive carotid artery calcification bilaterally, more pronounced on the right than on the left. IMPRESSION: CT head: Atrophy with patchy periventricular small vessel disease. No intracranial mass, hemorrhage, or extra-axial fluid collection. No acute infarct evident. There are foci of arterial vascular calcification. There is extensive mucosal thickening in the left maxillary antrum with calcification within this mucosal thickening. Calcification of this nature has been associated with fungal  colonization within the respective sinus. CT cervical spine: No acute fracture or spondylolisthesis. Multilevel arthropathy. Extensive carotid artery calcification bilaterally, more pronounced on the right than on the left. Electronically Signed   By: Bretta Bang III M.D.  On: 08/22/2015 19:43   Ct Cervical Spine Wo Contrast  08/22/2015  CLINICAL DATA:  Pain and dizziness following fall EXAM: CT HEAD WITHOUT CONTRAST CT CERVICAL SPINE WITHOUT CONTRAST TECHNIQUE: Multidetector CT imaging of the head and cervical spine was performed following the standard protocol without intravenous contrast. Multiplanar CT image reconstructions of the cervical spine were also generated. COMPARISON:  Cervical spine CT April 11, 2015; head CT August 08, 2015 FINDINGS: CT HEAD FINDINGS There is mild diffuse atrophy. There is no intracranial mass, hemorrhage, extra-axial fluid collection, or midline shift. There is patchy small vessel disease in the centra semiovale bilaterally. Elsewhere gray-white compartments appear normal. No acute infarct is evident. There are foci of calcification in both cavernous carotid arteries. The bony calvarium appears intact. Mastoid air cells are clear although somewhat hypoplastic on the right, stable. There is extensive mucosal thickening throughout much of the left maxillary antrum with calcification in this inspissated mucus material in the left maxillary antrum. Visualized orbits appear symmetric bilaterally. CT CERVICAL SPINE FINDINGS There is no demonstrable fracture or spondylolisthesis. Prevertebral soft tissues are normal. There is arthropathy with marked narrowing in the predental space region. There is moderately severe disc space narrowing at C5-6 and C6-7. There is milder narrowing at C7-T1. There are prominent anterior osteophytes at C4, C5, C6, and C7. There is facet hypertrophy at most levels bilaterally. There is moderate exit foraminal narrowing at multiple levels bilaterally.  There is no frank disc extrusion or stenosis. Note that there is calcification in the posterior longitudinal ligament at C5-6. There is fairly extensive carotid artery calcification bilaterally, more pronounced on the right than on the left. IMPRESSION: CT head: Atrophy with patchy periventricular small vessel disease. No intracranial mass, hemorrhage, or extra-axial fluid collection. No acute infarct evident. There are foci of arterial vascular calcification. There is extensive mucosal thickening in the left maxillary antrum with calcification within this mucosal thickening. Calcification of this nature has been associated with fungal colonization within the respective sinus. CT cervical spine: No acute fracture or spondylolisthesis. Multilevel arthropathy. Extensive carotid artery calcification bilaterally, more pronounced on the right than on the left. Electronically Signed   By: Bretta Bang III M.D.   On: 08/22/2015 19:43     Assessment/Plan: 82 year old male presenting with frequent falls.  CT of the head personally reviewed and shows evidence of small vessel ischemic changes.  No acute changes were noted.  Neurological examination is nonfocal but does suggest a peripheral neuropathy.  This is likely related to the patient's DM and may very well be contributing to his falls.  TSH and A1c normal.  B12 pending.   No indication based on neurological examination fir further imaging.  Will need some tactics that may be able to be provided by therapy to compensate for PN.    Thana Farr, MD Neurology [phone] 08/24/2015, 12:29 PM

## 2015-08-24 NOTE — Discharge Summary (Signed)
Nicolas Morris, 82 y.o., DOB [birth year], MRN [id number]. Admission date: 08/22/2015 Discharge Date 08/24/2015 Primary MD WHITE, Arlyss Repress, NP Admitting Physician Oralia Manis, MD  Admission Diagnosis  Elevated troponin [R79.89] Frequent falls [R29.6] Sinus mucosal thickening [J34.89] Closed head injury, initial encounter [S09.90XA] Fall, initial encounter [W19.XXXA]  Discharge Diagnosis   Principal Problem:   Frequent falls    Atrial fibrillation-permanent   History of GI bleed   Chronic combined systolic and diastolic CHF (congestive heart failure) (HCC)   NICM (nonischemic cardiomyopathy) (HCC)   Diabetes mellitus without complication (HCC)   Biventricular ICD (implantable cardioverter-defibrillator) -[employer]   Non-obstructive CAD   Unsteady gait   Elevated troponin          Hospital Course patient is an 82 year old with a history of frequent falls who was readmitted for frequent falls and unsteady gait who was brought into the ED. He was also complaining of feeling dizzy. Patient was noted to have slightly abnormal troponin of 0.1 therefore we were asked to admit the patient under observation. He underwent echocardiogram which showed no significant abnormality to explain his symptoms. He was seen by neurology was felt that his symptoms due to multifactorial reasons. It was recommended that he receive home physical therapy. Which is currently being arranged. At this point he is doing better and is stable for discharge.         Consults  neurology  Significant Tests:  See full reports for all details      Dg Chest 2 View  08/08/2015  CLINICAL DATA:  Seven 82-year-old male with fall on back pain EXAM: CHEST  2 VIEW COMPARISON:  Chest radiograph dated 06/22/2015 FINDINGS: Two views of the chest do not demonstrate a focal consolidation. There is no pleural effusion or pneumothorax. Stable cardiomegaly. Left pectoral AICD device. Old healed left clavicular fracture. No  acute fracture. IMPRESSION: No active cardiopulmonary disease. Electronically Signed   By: Elgie Collard M.D.   On: 08/08/2015 01:34   Dg Lumbar Spine 2-3 Views  08/08/2015  CLINICAL DATA:  Seven 82-year-old male with fall and back pain. EXAM: LUMBAR SPINE - 2-3 VIEW COMPARISON:  Abdominal CT dated 04/11/2015 FINDINGS: There is no definite acute fracture or subluxation of the lumbar spine. There is osteopenia with multilevel degenerative changes. Multilevel disc desiccation with vacuum phenomena noted. The visualized transverse and spinous processes appear intact. There is atherosclerotic calcification of the visualized abdominal aorta. The soft tissues are grossly unremarkable. IMPRESSION: No acute/traumatic lumbar spine pathology. Electronically Signed   By: Elgie Collard M.D.   On: 08/08/2015 01:33   Ct Head Wo Contrast  08/22/2015  CLINICAL DATA:  Pain and dizziness following fall EXAM: CT HEAD WITHOUT CONTRAST CT CERVICAL SPINE WITHOUT CONTRAST TECHNIQUE: Multidetector CT imaging of the head and cervical spine was performed following the standard protocol without intravenous contrast. Multiplanar CT image reconstructions of the cervical spine were also generated. COMPARISON:  Cervical spine CT April 11, 2015; head CT August 08, 2015 FINDINGS: CT HEAD FINDINGS There is mild diffuse atrophy. There is no intracranial mass, hemorrhage, extra-axial fluid collection, or midline shift. There is patchy small vessel disease in the centra semiovale bilaterally. Elsewhere gray-white compartments appear normal. No acute infarct is evident. There are foci of calcification in both cavernous carotid arteries. The bony calvarium appears intact. Mastoid air cells are clear although somewhat hypoplastic on the right, stable. There is extensive mucosal thickening throughout much of the left maxillary antrum with calcification in this inspissated mucus material in  the left maxillary antrum. Visualized orbits appear  symmetric bilaterally. CT CERVICAL SPINE FINDINGS There is no demonstrable fracture or spondylolisthesis. Prevertebral soft tissues are normal. There is arthropathy with marked narrowing in the predental space region. There is moderately severe disc space narrowing at C5-6 and C6-7. There is milder narrowing at C7-T1. There are prominent anterior osteophytes at C4, C5, C6, and C7. There is facet hypertrophy at most levels bilaterally. There is moderate exit foraminal narrowing at multiple levels bilaterally. There is no frank disc extrusion or stenosis. Note that there is calcification in the posterior longitudinal ligament at C5-6. There is fairly extensive carotid artery calcification bilaterally, more pronounced on the right than on the left. IMPRESSION: CT head: Atrophy with patchy periventricular small vessel disease. No intracranial mass, hemorrhage, or extra-axial fluid collection. No acute infarct evident. There are foci of arterial vascular calcification. There is extensive mucosal thickening in the left maxillary antrum with calcification within this mucosal thickening. Calcification of this nature has been associated with fungal colonization within the respective sinus. CT cervical spine: No acute fracture or spondylolisthesis. Multilevel arthropathy. Extensive carotid artery calcification bilaterally, more pronounced on the right than on the left. Electronically Signed   By: Bretta Bang III M.D.   On: 08/22/2015 19:43   Ct Head Wo Contrast  08/08/2015  CLINICAL DATA:  82 year old male with fall EXAM: CT HEAD WITHOUT CONTRAST TECHNIQUE: Contiguous axial images were obtained from the base of the skull through the vertex without intravenous contrast. COMPARISON:  Head CT dated 04/11/2015 FINDINGS: The ventricles and sulci are appropriate in size for patient's age. Minimal periventricular and deep white matter chronic microvascular ischemic changes noted. There is no acute intracranial hemorrhage.  No mass effect or midline shift. There is mild mucoperiosteal thickening of the paranasal sinuses. There is partial opacification of the left maxillary sinus, chronic. No air-fluid level. The mastoid air cells are clear. The calvarium is intact. IMPRESSION: No acute intracranial hemorrhage. Electronically Signed   By: Elgie Collard M.D.   On: 08/08/2015 01:41   Ct Cervical Spine Wo Contrast  08/22/2015  CLINICAL DATA:  Pain and dizziness following fall EXAM: CT HEAD WITHOUT CONTRAST CT CERVICAL SPINE WITHOUT CONTRAST TECHNIQUE: Multidetector CT imaging of the head and cervical spine was performed following the standard protocol without intravenous contrast. Multiplanar CT image reconstructions of the cervical spine were also generated. COMPARISON:  Cervical spine CT April 11, 2015; head CT August 08, 2015 FINDINGS: CT HEAD FINDINGS There is mild diffuse atrophy. There is no intracranial mass, hemorrhage, extra-axial fluid collection, or midline shift. There is patchy small vessel disease in the centra semiovale bilaterally. Elsewhere gray-white compartments appear normal. No acute infarct is evident. There are foci of calcification in both cavernous carotid arteries. The bony calvarium appears intact. Mastoid air cells are clear although somewhat hypoplastic on the right, stable. There is extensive mucosal thickening throughout much of the left maxillary antrum with calcification in this inspissated mucus material in the left maxillary antrum. Visualized orbits appear symmetric bilaterally. CT CERVICAL SPINE FINDINGS There is no demonstrable fracture or spondylolisthesis. Prevertebral soft tissues are normal. There is arthropathy with marked narrowing in the predental space region. There is moderately severe disc space narrowing at C5-6 and C6-7. There is milder narrowing at C7-T1. There are prominent anterior osteophytes at C4, C5, C6, and C7. There is facet hypertrophy at most levels bilaterally. There is  moderate exit foraminal narrowing at multiple levels bilaterally. There is no frank disc extrusion or  stenosis. Note that there is calcification in the posterior longitudinal ligament at C5-6. There is fairly extensive carotid artery calcification bilaterally, more pronounced on the right than on the left. IMPRESSION: CT head: Atrophy with patchy periventricular small vessel disease. No intracranial mass, hemorrhage, or extra-axial fluid collection. No acute infarct evident. There are foci of arterial vascular calcification. There is extensive mucosal thickening in the left maxillary antrum with calcification within this mucosal thickening. Calcification of this nature has been associated with fungal colonization within the respective sinus. CT cervical spine: No acute fracture or spondylolisthesis. Multilevel arthropathy. Extensive carotid artery calcification bilaterally, more pronounced on the right than on the left. Electronically Signed   By: Bretta Bang III M.D.   On: 08/22/2015 19:43   Dg Hip Unilat With Pelvis 2-3 Views Right  08/08/2015  CLINICAL DATA:  82 year old male with fall and back pain. EXAM: DG HIP (WITH OR WITHOUT PELVIS) 2-3V RIGHT COMPARISON:  None. FINDINGS: There is no acute fracture or dislocation of the right hip. There are bilateral osteoarthritic changes of the hips as well as degenerative changes of the visualized lower lumbar spine. The soft tissues appear unremarkable. IMPRESSION: No acute fracture or dislocation. Electronically Signed   By: Elgie Collard M.D.   On: 08/08/2015 01:29       Today   Subjective:   Nicolas Morris  feeling better wants to go home  Objective:   Blood pressure 140/73, pulse 83, temperature 97.8 F (36.6 C), temperature source Oral, resp. rate 20, height 6\' 2"  (1.88 m), weight 95.482 kg (210 lb 8 oz), SpO2 95 %.  .  Intake/Output Summary (Last 24 hours) at 08/24/15 1353 Last data filed at 08/24/15 0920  Gross per 24 hour  Intake     240 ml  Output    700 ml  Net   -460 ml    Exam VITAL SIGNS: Blood pressure 140/73, pulse 83, temperature 97.8 F (36.6 C), temperature source Oral, resp. rate 20, height 6\' 2"  (1.88 m), weight 95.482 kg (210 lb 8 oz), SpO2 95 %.  GENERAL:  82 y.o.-year-old patient lying in the bed with no acute distress.  EYES: Pupils equal, round, reactive to light and accommodation. No scleral icterus. Extraocular muscles intact.  HEENT: Head atraumatic, normocephalic. Oropharynx and nasopharynx clear.  NECK:  Supple, no jugular venous distention. No thyroid enlargement, no tenderness.  LUNGS: Normal breath sounds bilaterally, no wheezing, rales,rhonchi or crepitation. No use of accessory muscles of respiration.  CARDIOVASCULAR: S1, S2 normal. No murmurs, rubs, or gallops.  ABDOMEN: Soft, nontender, nondistended. Bowel sounds present. No organomegaly or mass.  EXTREMITIES: No pedal edema, cyanosis, or clubbing.  NEUROLOGIC: Cranial nerves II through XII are intact. Muscle strength 5/5 in all extremities. Sensation intact. Gait not checked.  PSYCHIATRIC: The patient is alert and oriented x 3.  SKIN: No obvious rash, lesion, or ulcer.   Data Review     CBC w Diff: Lab Results  Component Value Date   WBC 8.8 08/23/2015   WBC 13.7* 06/03/2014   HGB 15.3 08/23/2015   HGB 7.4* 06/03/2014   HCT 45.2 08/23/2015   HCT 25.8* 06/03/2014   PLT 195 08/23/2015   PLT 262 06/03/2014   LYMPHOPCT 11 08/08/2015   LYMPHOPCT 21.9 05/30/2014   MONOPCT 7 08/08/2015   MONOPCT 9.9 05/30/2014   EOSPCT 1 08/08/2015   EOSPCT 2.2 05/30/2014   BASOPCT 3 08/08/2015   BASOPCT 1.3 05/30/2014   CMP: Lab Results  Component Value Date  NA 139 08/23/2015   NA 130* 06/03/2014   K 4.1 08/23/2015   K 4.0 06/03/2014   CL 102 08/23/2015   CL 100* 06/03/2014   CO2 29 08/23/2015   CO2 23 06/03/2014   BUN 15 08/23/2015   BUN 24* 06/03/2014   CREATININE 0.87 08/23/2015   CREATININE 1.12 06/03/2014   PROT 7.5  08/08/2015   PROT 7.3 06/03/2014   ALBUMIN 4.0 08/08/2015   ALBUMIN 3.5 06/03/2014   BILITOT 0.7 08/08/2015   BILITOT 0.8 06/03/2014   ALKPHOS 82 08/08/2015   ALKPHOS 59 06/03/2014   AST 22 08/08/2015   AST 22 06/03/2014   ALT 15* 08/08/2015   ALT 13* 06/03/2014  .  Micro Results Recent Results (from the past 240 hour(s))  MRSA PCR Screening     Status: None   Collection Time: 08/23/15  7:49 AM  Result Value Ref Range Status   MRSA by PCR NEGATIVE NEGATIVE Final    Comment:        The GeneXpert MRSA Assay (FDA approved for NASAL specimens only), is one component of a comprehensive MRSA colonization surveillance program. It is not intended to diagnose MRSA infection nor to guide or monitor treatment for MRSA infections.         Code Status Orders        Start     Ordered   08/23/15 0201  Do not attempt resuscitation (DNR)   Continuous    Question Answer Comment  In the event of cardiac or respiratory ARREST Do not call a "code blue"   In the event of cardiac or respiratory ARREST Do not perform Intubation, CPR, defibrillation or ACLS   In the event of cardiac or respiratory ARREST Use medication by any route, position, wound care, and other measures to relive pain and suffering. May use oxygen, suction and manual treatment of airway obstruction as needed for comfort.      08/23/15 0200    Code Status History    Date Active Date Inactive Code Status Order ID Comments User Context   08/25/2014 11:33 PM 08/30/2014  3:25 PM DNR [id number]  Oralia Manis, MD Inpatient   08/25/2014 10:30 PM 08/25/2014 11:33 PM Full Code [id number]  Katharina Caper, MD Inpatient   08/25/2014  8:32 PM 08/25/2014 10:30 PM DNR [id number]  Katharina Caper, MD ED    Advance Directive Documentation        Most Recent Value   Type of Advance Directive  Healthcare Power of Angelica Chessman ]   Pre-existing out of facility DNR order (yellow form or pink MOST form)     "MOST" Form in Place?             Follow-up Information    Follow up with WHITE, Arlyss Repress, NP. Go on 08/31/2015.   Specialty:  Family Medicine   Why:  Time: 4:00 p.m. Please arrive 15 mins prior to your appointment   Contact information:   [address] [place] [address] [phone]       Follow up with Cammy Copa, MD. Schedule an appointment as soon as possible for a visit in 2 days.   Specialty:  Otolaryngology   Contact information:   [address] [place] [address] [phone]       Discharge Medications     Medication List    TAKE these medications        acetaminophen 500 MG tablet  Commonly known as:  TYLENOL  Take 500  mg by mouth 3 (three) times daily.     aspirin 81 MG chewable tablet  Chew by mouth daily.     atorvastatin 80 MG tablet  Commonly known as:  LIPITOR  Take 80 mg by mouth daily.     betamethasone dipropionate 0.05 % cream  Commonly known as:  DIPROLENE  Apply 1 application topically 2 (two) times daily.     buPROPion 300 MG 24 hr tablet  Commonly known as:  WELLBUTRIN XL  Take 300 mg by mouth daily.     carvedilol 3.125 MG tablet  Commonly known as:  COREG  Take 3.125 mg by mouth 2 (two) times daily with a meal.     digoxin 0.25 MG tablet  Commonly known as:  LANOXIN  Take 0.25 mg by mouth daily.     finasteride 5 MG tablet  Commonly known as:  PROSCAR  Take 5 mg by mouth daily.     FLOMAX 0.4 MG Caps capsule  Generic drug:  tamsulosin  Take 0.4 mg by mouth.     furosemide 20 MG tablet  Commonly known as:  LASIX  Take 20 mg by mouth daily.     gabapentin 600 MG tablet  Commonly known as:  NEURONTIN  Take 600 mg by mouth 3 (three) times daily.     lisinopril 2.5 MG tablet  Commonly known as:  PRINIVIL,ZESTRIL  Take 2.5 mg by mouth daily.     Melatonin 1 MG Tabs  Take 1 mg by mouth at bedtime.     metFORMIN 500 MG tablet  Commonly known as:  GLUCOPHAGE  Take 1,000 mg by mouth 2 (two) times daily with a  meal.     pantoprazole 40 MG tablet  Commonly known as:  PROTONIX  Take 40 mg by mouth daily.     potassium chloride SA 20 MEQ tablet  Commonly known as:  K-DUR,KLOR-CON  Take 20 mEq by mouth 2 (two) times daily.     sertraline 100 MG tablet  Commonly known as:  ZOLOFT  Take 100 mg by mouth daily.     traZODone 100 MG tablet  Commonly known as:  DESYREL  Take 200 mg by mouth at bedtime.           Total Time in preparing paper work, data evaluation and todays exam - 35 minutes  Auburn Bilberry M.D on 08/24/2015 at 1:53 PM  [facility]   Office  [phone]

## 2015-08-24 NOTE — Care Management (Signed)
Faxed [facility] & P, progress note and home health orders to cheryl with [facility].

## 2015-08-24 NOTE — Progress Notes (Signed)
Discharge instructions along with home medication list gone over with patients wife. Wife verbalized that she understood instructions. No rx given to wife. DNR form given to patient. Patient being discharged home on ra. Will have home health PT, no distress noted at this time

## 2015-08-24 NOTE — Discharge Instructions (Signed)
DIET:  Cardiac diet  DISCHARGE CONDITION:  Stable  ACTIVITY:  Activity as tolerated  OXYGEN:  Home Oxygen: No.   Oxygen Delivery: room air  DISCHARGE LOCATION:  home    ADDITIONAL DISCHARGE INSTRUCTION:   If you experience worsening of your admission symptoms, develop shortness of breath, life threatening emergency, suicidal or homicidal thoughts you must seek medical attention immediately by calling 911 or calling your MD immediately  if symptoms less severe.  You Must read complete instructions/literature along with all the possible adverse reactions/side effects for all the Medicines you take and that have been prescribed to you. Take any new Medicines after you have completely understood and accpet all the possible adverse reactions/side effects.   Please note  You were cared for by a hospitalist during your hospital stay. If you have any questions about your discharge medications or the care you received while you were in the hospital after you are discharged, you can call the unit and asked to speak with the hospitalist on call if the hospitalist that took care of you is not available. Once you are discharged, your primary care physician will handle any further medical issues. Please note that NO REFILLS for any discharge medications will be authorized once you are discharged, as it is imperative that you return to your primary care physician (or establish a relationship with a primary care physician if you do not have one) for your aftercare needs so that they can reassess your need for medications and monitor your lab values.     Use your walker at all times. Return to the emergency room for any new or worrisome symptoms. Fall Prevention in the Home  Falls can cause injuries and can affect people from all age groups. There are many simple things that you can do to make your home safe and to help prevent falls. WHAT CAN I DO ON THE OUTSIDE OF MY HOME?  Regularly repair  the edges of walkways and driveways and fix any cracks.  Remove high doorway thresholds.  Trim any shrubbery on the main path into your home.  Use bright outdoor lighting.  Clear walkways of debris and clutter, including tools and rocks.  Regularly check that handrails are securely fastened and in good repair. Both sides of any steps should have handrails.  Install guardrails along the edges of any raised decks or porches.  Have leaves, snow, and ice cleared regularly.  Use sand or salt on walkways during winter months.  In the garage, clean up any spills right away, including grease or oil spills. WHAT CAN I DO IN THE BATHROOM?  Use night lights.  Install grab bars by the toilet and in the tub and shower. Do not use towel bars as grab bars.  Use non-skid mats or decals on the floor of the tub or shower.  If you need to sit down while you are in the shower, use a plastic, non-slip stool.Marland Kitchen  Keep the floor dry. Immediately clean up any water that spills on the floor.  Remove soap buildup in the tub or shower on a regular basis.  Attach bath mats securely with double-sided non-slip rug tape.  Remove throw rugs and other tripping hazards from the floor. WHAT CAN I DO IN THE BEDROOM?  Use night lights.  Make sure that a bedside light is easy to reach.  Do not use oversized bedding that drapes onto the floor.  Have a firm chair that has side arms to use for getting  dressed.  Remove throw rugs and other tripping hazards from the floor. WHAT CAN I DO IN THE KITCHEN?   Clean up any spills right away.  Avoid walking on wet floors.  Place frequently used items in easy-to-reach places.  If you need to reach for something above you, use a sturdy step stool that has a grab bar.  Keep electrical cables out of the way.  Do not use floor polish or wax that makes floors slippery. If you have to use wax, make sure that it is non-skid floor wax.  Remove throw rugs and other  tripping hazards from the floor. WHAT CAN I DO IN THE STAIRWAYS?  Do not leave any items on the stairs.  Make sure that there are handrails on both sides of the stairs. Fix handrails that are broken or loose. Make sure that handrails are as long as the stairways.  Check any carpeting to make sure that it is firmly attached to the stairs. Fix any carpet that is loose or worn.  Avoid having throw rugs at the top or bottom of stairways, or secure the rugs with carpet tape to prevent them from moving.  Make sure that you have a light switch at the top of the stairs and the bottom of the stairs. If you do not have them, have them installed. WHAT ARE SOME OTHER FALL PREVENTION TIPS?  Wear closed-toe shoes that fit well and support your feet. Wear shoes that have rubber soles or low heels.  When you use a stepladder, make sure that it is completely opened and that the sides are firmly locked. Have someone hold the ladder while you are using it. Do not climb a closed stepladder.  Add color or contrast paint or tape to grab bars and handrails in your home. Place contrasting color strips on the first and last steps.  Use mobility aids as needed, such as canes, walkers, scooters, and crutches.  Turn on lights if it is dark. Replace any light bulbs that burn out.  Set up furniture so that there are clear paths. Keep the furniture in the same spot.  Fix any uneven floor surfaces.  Choose a carpet design that does not hide the edge of steps of a stairway.  Be aware of any and all pets.  Review your medicines with your healthcare provider. Some medicines can cause dizziness or changes in blood pressure, which increase your risk of falling. Talk with your health care provider about other ways that you can decrease your risk of falls. This may include working with a physical therapist or trainer to improve your strength, balance, and endurance.   This information is not intended to replace advice  given to you by your health care provider. Make sure you discuss any questions you have with your health care provider.   Document Released: 01/25/2002 Document Revised: 06/21/2014 Document Reviewed: 03/11/2014 Elsevier Interactive Patient Education [employer]

## 2015-08-24 NOTE — Care Management (Signed)
Spoke with wife. She states [facility] has been coming out for PT and SW. Verified services with [facility].Patient is a frequent flyer to the ED per [facility].  Updated them that patient will need SN, PT. Wife states she is in the process of arranging PCS services for patient. No other needs identified.

## 2015-08-25 ENCOUNTER — Emergency Department
Admission: EM | Admit: 2015-08-25 | Discharge: 2015-08-25 | Disposition: A | Discharge status: Home / Self Care | Payer: Medicare Other | Attending: Emergency Medicine | Admitting: Emergency Medicine

## 2015-08-25 DIAGNOSIS — F0390 Unspecified dementia without behavioral disturbance: Secondary | ICD-10-CM | POA: Insufficient documentation

## 2015-08-25 DIAGNOSIS — Z7984 Long term (current) use of oral hypoglycemic drugs: Secondary | ICD-10-CM | POA: Diagnosis not present

## 2015-08-25 DIAGNOSIS — I110 Hypertensive heart disease with heart failure: Secondary | ICD-10-CM | POA: Diagnosis not present

## 2015-08-25 DIAGNOSIS — I2510 Atherosclerotic heart disease of native coronary artery without angina pectoris: Secondary | ICD-10-CM | POA: Diagnosis not present

## 2015-08-25 DIAGNOSIS — I4891 Unspecified atrial fibrillation: Secondary | ICD-10-CM | POA: Insufficient documentation

## 2015-08-25 DIAGNOSIS — I5042 Chronic combined systolic (congestive) and diastolic (congestive) heart failure: Secondary | ICD-10-CM | POA: Insufficient documentation

## 2015-08-25 DIAGNOSIS — Z95810 Presence of automatic (implantable) cardiac defibrillator: Secondary | ICD-10-CM | POA: Diagnosis not present

## 2015-08-25 DIAGNOSIS — G44209 Tension-type headache, unspecified, not intractable: Secondary | ICD-10-CM | POA: Insufficient documentation

## 2015-08-25 DIAGNOSIS — Z7982 Long term (current) use of aspirin: Secondary | ICD-10-CM | POA: Insufficient documentation

## 2015-08-25 DIAGNOSIS — Z87891 Personal history of nicotine dependence: Secondary | ICD-10-CM | POA: Insufficient documentation

## 2015-08-25 DIAGNOSIS — E119 Type 2 diabetes mellitus without complications: Secondary | ICD-10-CM | POA: Insufficient documentation

## 2015-08-25 DIAGNOSIS — Z01818 Encounter for other preprocedural examination: Secondary | ICD-10-CM | POA: Diagnosis present

## 2015-08-25 LAB — VITAMIN D 25 HYDROXY (VIT D DEFICIENCY, FRACTURES): VIT D 25 HYDROXY: 10.2 ng/mL — AB (ref 30.0–100.0)

## 2015-08-25 MED ORDER — ACETAMINOPHEN 325 MG PO TABS
650.0000 mg | ORAL_TABLET | Freq: Once | ORAL | Status: AC
Start: 2015-08-25 — End: 2015-08-25
  Administered 2015-08-25: 650 mg via ORAL
  Filled 2015-08-25: qty 2

## 2015-08-25 NOTE — ED Notes (Signed)
Pt arrives here via [facility] from home   - no report received from EMS to me  Pt reports that he was here yesterday and we did nothing and he has no idea why he has been brought back here today  Initially he stated that he fell today and then he stated  "I did not fall - my wife does not want to take care of me - I can't get any help."

## 2015-08-25 NOTE — ED Notes (Signed)
BEHAVIORAL HEALTH ROUNDING Patient sleeping: No. Patient alert and oriented: yes Behavior appropriate: Yes.  ; If no, describe:  Nutrition and fluids offered: yes Toileting and hygiene offered: Yes  Sitter present: q15 minute observations and security monitoring Law enforcement present: Yes  ODS  Meal tray provided

## 2015-08-25 NOTE — ED Provider Notes (Signed)
[facility] Emergency Department Provider Note  ____________________________________________  Time seen: 12:00 PM  I have reviewed the triage vital signs and the nursing notes.   HISTORY  Chief Complaint Medical Clearance Headache   HPI Nicolas Morris is a 82 y.o. male brought to the ED by EMS because "my wife 7 needed to come". EMS report that first responders had been called to the residence for the patient's wife who is apparently having a mental health disturbance. While police and fire department with their attending to the wife, the patient stated that he needed to be taken to the hospital as well. When asked what symptoms he was having, the patient stated that he was having no symptoms but wanted to be evaluated.  Currently the patient reports that he has had a posterior headache for the past 2 weeks. It extends down into his neck. No fevers chills numbness tingling weakness vision changes or nausea or vomiting. He reports that he doesn't eat much because his wife called and she doesn't either.     Past Medical History  Diagnosis Date  . Non-obstructive CAD     a. s/p multiple caths @ [facility] - last reportedly 06/2012;  b. 06/2013 Lexi MV: EF 51%, no ischemia/infarct.  Marland Kitchen NICM (nonischemic cardiomyopathy) (HCC)     a. EF reportedly < 30% 01/2013 ([facility]);  b. 01/2012 s/p MDT DTBB1D1 Viva S CRT-D;  c. 12/2012 Echo: EF 45-50%, inf/post HK, nl RV, mild MR.  . Diabetes mellitus without complication (HCC)   . A-fib (HCC)     a. Permanent, on Xarelto.  . H/O: GI bleed     a. 12/2012 EGD: HH, evidence of reflux. Erosive Gastritis. Nl duodenum;  b. 12/2012 Colonoscopy: Sev sigmoid and desc colon diverticulosis, transverse and ascending colon diverticulosis, nonbleeding internal hemorrhoids, no bleeding.  . Iron deficiency anemia   . Biventricular ICD (implantable cardioverter-defibrillator) -Medtronic 01/12/2013    a. 01/2012 [facility]:  01/2012 s/p MDT DTBB1D1  Viva S CRT-D.  Atrial port capped (afib).  . Diverticulosis     a. 12/2012 noted on colonoscopy.  . Hiatal hernia     a. 12/2012 noted on EGD.  Marland Kitchen Gastritis     a. 12/2012 noted on EGD.  Marland Kitchen Hypertension   . AICD (automatic cardioverter/defibrillator) present   . Presence of permanent cardiac pacemaker      Patient Active Problem List   Diagnosis Date Noted  . Fall   . Frequent falls 08/22/2015  . Overdose 06/22/2015  . Dementia, senile with depression 06/22/2015  . Iron deficiency anemia 10/24/2014  . GIB (gastrointestinal bleeding) 08/30/2014  . Hyponatremia 08/30/2014  . Elevated troponin 08/30/2014  . External hemorrhoids 08/30/2014  . Diverticulosis of colon without hemorrhage 08/30/2014  . Gastritis and gastroduodenitis 08/30/2014  . Acute posthemorrhagic anemia 08/25/2014  . Unsteady gait 03/04/2014  . NICM (nonischemic cardiomyopathy) (HCC) 07/20/2013  . Diabetes mellitus without complication (HCC)   . Diverticulosis   . Hiatal hernia   . Non-obstructive CAD   . Neck pain 04/21/2013  . Depression 01/26/2013  . Atrial fibrillation-permanent 01/12/2013  . Biventricular ICD (implantable cardioverter-defibrillator) -Medtronic 01/12/2013  . Congestive dilated cardiomyopathy (HCC) 01/12/2013  . GI bleed 01/12/2013  . Chronic combined systolic and diastolic CHF (congestive heart failure) (HCC) 01/12/2013  . Biventricular ICD (implantable cardioverter-defibrillator) -Medtronic 01/12/2013     Past Surgical History  Procedure Laterality Date  . Cardiac catheterization  2013    [facility]   . Cardiac catheterization  2011  . Defib implant  01/27/2012    Serial # [id number] H Model # [id number]  . Pacemaker insertion  01/27/2012  . Colonoscopy  32014  . Upper gastrointestinal endoscopy  2014  . Fetal blood transfusion  2014  . Cardiac defibrillator placement    . Esophagogastroduodenoscopy (egd) with propofol N/A 08/29/2014    Procedure: ESOPHAGOGASTRODUODENOSCOPY (EGD) WITH  PROPOFOL;  Surgeon: Elnita Maxwell, MD;  Location: [facility] ENDOSCOPY;  Service: Endoscopy;  Laterality: N/A;  . Colonoscopy with propofol N/A 08/29/2014    Procedure: COLONOSCOPY WITH PROPOFOL;  Surgeon: Elnita Maxwell, MD;  Location: [facility] ENDOSCOPY;  Service: Endoscopy;  Laterality: N/A;     Current Outpatient Rx  Name  Route  Sig  Dispense  Refill  . acetaminophen (TYLENOL) 500 MG tablet   Oral   Take 500 mg by mouth 3 (three) times daily.         Marland Kitchen aspirin 81 MG chewable tablet   Oral   Chew by mouth daily.         Marland Kitchen atorvastatin (LIPITOR) 80 MG tablet   Oral   Take 80 mg by mouth daily.         . betamethasone dipropionate (DIPROLENE) 0.05 % cream   Topical   Apply 1 application topically 2 (two) times daily.         Marland Kitchen buPROPion (WELLBUTRIN XL) 300 MG 24 hr tablet   Oral   Take 300 mg by mouth daily.         . carvedilol (COREG) 3.125 MG tablet   Oral   Take 3.125 mg by mouth 2 (two) times daily with a meal.         . digoxin (LANOXIN) 0.25 MG tablet   Oral   Take 0.25 mg by mouth daily.         . finasteride (PROSCAR) 5 MG tablet   Oral   Take 5 mg by mouth daily.         . furosemide (LASIX) 20 MG tablet   Oral   Take 20 mg by mouth daily.         Marland Kitchen gabapentin (NEURONTIN) 600 MG tablet   Oral   Take 600 mg by mouth 3 (three) times daily.         Marland Kitchen lisinopril (PRINIVIL,ZESTRIL) 2.5 MG tablet   Oral   Take 2.5 mg by mouth daily.         . Melatonin 1 MG TABS   Oral   Take 1 mg by mouth at bedtime.         . metFORMIN (GLUCOPHAGE) 500 MG tablet   Oral   Take 1,000 mg by mouth 2 (two) times daily with a meal.          . pantoprazole (PROTONIX) 40 MG tablet   Oral   Take 40 mg by mouth daily.          . potassium chloride SA (K-DUR,KLOR-CON) 20 MEQ tablet   Oral   Take 20 mEq by mouth 2 (two) times daily.          . sertraline (ZOLOFT) 100 MG tablet   Oral   Take 100 mg by mouth daily.         . tamsulosin  (FLOMAX) 0.4 MG CAPS capsule   Oral   Take 0.4 mg by mouth.         . traZODone (DESYREL) 100 MG tablet   Oral   Take 200 mg by  mouth at bedtime.            Allergies Review of patient's allergies indicates no known allergies.   Family History  Problem Relation Age of Onset  . Family history unknown: Yes    Social History Social History  Substance Use Topics  . Smoking status: Former Smoker -- 3.00 packs/day for 45 years    Types: Cigarettes    Quit date: 01/13/2003  . Smokeless tobacco: Not on file  . Alcohol Use: No    Review of Systems  Constitutional:   No fever or chills.  ENT:   No sore throat. No rhinorrhea. Cardiovascular:   No chest pain. Respiratory:   No dyspnea or cough. Gastrointestinal:   Negative for abdominal pain, vomiting and diarrhea.  Genitourinary:   Negative for dysuria or difficulty urinating. Musculoskeletal:   Negative for focal pain or swelling Neurological:   Positive as above for headache 10-point ROS otherwise negative.  ____________________________________________   PHYSICAL EXAM:  VITAL SIGNS: ED Triage Vitals  Enc Vitals Group     BP 08/25/15 1251 154/78 mmHg     Pulse Rate 08/25/15 1251 74     Resp 08/25/15 1251 17     Temp 08/25/15 1251 98.3 F (36.8 C)     Temp Source 08/25/15 1251 Oral     SpO2 08/25/15 1251 95 %     Weight 08/25/15 1235 232 lb (105.235 kg)     Height 08/25/15 1235 6\' 2"  (1.88 m)     Head Cir --      Peak Flow --      Pain Score 08/25/15 1236 0     Pain Loc --      Pain Edu? --      Excl. in GC? --     Vital signs reviewed, nursing assessments reviewed.   Constitutional:   Alert and orientedTo person and place and time. Well appearing and in no distress. Eyes:   No scleral icterus. No conjunctival pallor. PERRL. EOMI.  No nystagmus. ENT   Head:   Normocephalic and atraumatic. Tenderness along the posterior musculature bilaterally which reproduces his headache symptoms in the  distribution of the scalenes and trapezius   Nose:   No congestion/rhinnorhea. No septal hematoma   Mouth/Throat:   MMM, no pharyngeal erythema. No peritonsillar mass.    Neck:   No stridor. No SubQ emphysema. No meningismus. Hematological/Lymphatic/Immunilogical:   No cervical lymphadenopathy. Cardiovascular:   RRR. Symmetric bilateral radial and DP pulses.  No murmurs.  Respiratory:   Normal respiratory effort without tachypnea nor retractions. Breath sounds are clear and equal bilaterally. No wheezes/rales/rhonchi. Gastrointestinal:   Soft and nontender. Non distended. There is no CVA tenderness.  No rebound, rigidity, or guarding. Genitourinary:   deferred Musculoskeletal:   Nontender with normal range of motion in all extremities. No joint effusions.  No lower extremity tenderness.  No edema. Neurologic:   Normal speech and language.  CN 2-10 normal. Motor grossly intact. No gross focal neurologic deficits are appreciated.  Skin:    Skin is warm, dry and intact. No rash noted.  No petechiae, purpura, or bullae.  ____________________________________________    LABS (pertinent positives/negatives) (all labs ordered are listed, but only abnormal results are displayed) Labs Reviewed - No data to display ____________________________________________   EKG  Interpreted by me Paced rhythm, right axis, left bundle branch block. No acute ischemic changes. 2 PVCs on the strip  ____________________________________________    RADIOLOGY    ____________________________________________  PROCEDURES   ____________________________________________   INITIAL IMPRESSION / ASSESSMENT AND PLAN / ED COURSE  Pertinent labs & imaging results that were available during my care of the patient were reviewed by me and considered in my medical decision making (see chart for details).  Patient complains of chronic headache, by history and exam is consistent with tension  headache.Considering the patient's symptoms, medical history, and physical examination today, I have low suspicion for ischemic stroke, intracranial hemorrhage, meningitis, encephalitis, carotid or vertebral dissection, venous sinus thrombosis, MS, intracranial hypertension, glaucoma, CRAO, CRVO, or temporal arteritis. Patient very well appearing, overall has no acute complaints, actually comes to the emergency department because his wife told him to and he reports that he did not want a common does not think he needs to be here. There is given Tylenol, we'll discharge home to follow up with primary care.     ____________________________________________   FINAL CLINICAL IMPRESSION(S) / ED DIAGNOSES  Final diagnoses:  Tension headache       Portions of this note were generated with dragon dictation software. Dictation errors may occur despite best attempts at proofreading.   Sharman Cheek, MD 08/25/15 1309

## 2015-08-25 NOTE — Discharge Instructions (Signed)
Tension Headache A tension headache is a feeling of pain, pressure, or aching that is often felt over the front and sides of the head. The pain can be dull, or it can feel tight (constricting). Tension headaches are not normally associated with nausea or vomiting, and they do not get worse with physical activity. Tension headaches can last from 30 minutes to several days. This is the most common type of headache. CAUSES The exact cause of this condition is not known. Tension headaches often begin after stress, anxiety, or depression. Other triggers may include:  Alcohol.  Too much caffeine, or caffeine withdrawal.  Respiratory infections, such as colds, flu, or sinus infections.  Dental problems or teeth clenching.  Fatigue.  Holding your head and neck in the same position for a long period of time, such as while using a computer.  Smoking. SYMPTOMS Symptoms of this condition include:  A feeling of pressure around the head.  Dull, aching head pain.  Pain felt over the front and sides of the head.  Tenderness in the muscles of the head, neck, and shoulders. DIAGNOSIS This condition may be diagnosed based on your symptoms and a physical exam. Tests may be done, such as a CT scan or an MRI of your head. These tests may be done if your symptoms are severe or unusual. TREATMENT This condition may be treated with lifestyle changes and medicines to help relieve symptoms. HOME CARE INSTRUCTIONS Managing Pain  Take over-the-counter and prescription medicines only as told by your health care provider.  Lie down in a dark, quiet room when you have a headache.  If directed, apply ice to the head and neck area:  Put ice in a plastic bag.  Place a towel between your skin and the bag.  Leave the ice on for 20 minutes, 2-3 times per day.  Use a heating pad or a hot shower to apply heat to the head and neck area as told by your health care provider. Eating and Drinking  Eat meals on  a regular schedule.  Limit alcohol use.  Decrease your caffeine intake, or stop using caffeine. General Instructions  Keep all follow-up visits as told by your health care provider. This is important.  Keep a headache journal to help find out what may trigger your headaches. For example, write down:  What you eat and drink.  How much sleep you get.  Any change to your diet or medicines.  Try massage or other relaxation techniques.  Limit stress.  Sit up straight, and avoid tensing your muscles.  Do not use tobacco products, including cigarettes, chewing tobacco, or e-cigarettes. If you need help quitting, ask your health care provider.  Exercise regularly as told by your health care provider.  Get 7-9 hours of sleep, or the amount recommended by your health care provider. SEEK MEDICAL CARE IF:  Your symptoms are not helped by medicine.  You have a headache that is different from what you normally experience.  You have nausea or you vomit.  You have a fever. SEEK IMMEDIATE MEDICAL CARE IF:  Your headache becomes severe.  You have repeated vomiting.  You have a stiff neck.  You have a loss of vision.  You have problems with speech.  You have pain in your eye or ear.  You have muscular weakness or loss of muscle control.  You lose your balance or you have trouble walking.  You feel faint or you pass out.  You have confusion.     This information is not intended to replace advice given to you by your health care provider. Make sure you discuss any questions you have with your health care provider.   Document Released: 02/04/2005 Document Revised: 10/26/2014 Document Reviewed: 05/30/2014 Elsevier Interactive Patient Education 2016 Elsevier Inc.  
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
## 2015-08-25 NOTE — ED Notes (Signed)

## 2015-09-08 ENCOUNTER — Emergency Department: Payer: Medicare Other

## 2015-09-08 ENCOUNTER — Emergency Department
Admission: EM | Admit: 2015-09-08 | Discharge: 2015-09-08 | Disposition: A | Discharge status: Home / Self Care | Payer: Medicare Other | Attending: Emergency Medicine | Admitting: Emergency Medicine

## 2015-09-08 DIAGNOSIS — W1839XA Other fall on same level, initial encounter: Secondary | ICD-10-CM | POA: Diagnosis not present

## 2015-09-08 DIAGNOSIS — Z87891 Personal history of nicotine dependence: Secondary | ICD-10-CM | POA: Insufficient documentation

## 2015-09-08 DIAGNOSIS — Y939 Activity, unspecified: Secondary | ICD-10-CM | POA: Insufficient documentation

## 2015-09-08 DIAGNOSIS — Z79899 Other long term (current) drug therapy: Secondary | ICD-10-CM | POA: Insufficient documentation

## 2015-09-08 DIAGNOSIS — M25552 Pain in left hip: Secondary | ICD-10-CM | POA: Insufficient documentation

## 2015-09-08 DIAGNOSIS — I5042 Chronic combined systolic (congestive) and diastolic (congestive) heart failure: Secondary | ICD-10-CM | POA: Diagnosis not present

## 2015-09-08 DIAGNOSIS — S0990XA Unspecified injury of head, initial encounter: Secondary | ICD-10-CM | POA: Diagnosis present

## 2015-09-08 DIAGNOSIS — Z950 Presence of cardiac pacemaker: Secondary | ICD-10-CM | POA: Diagnosis not present

## 2015-09-08 DIAGNOSIS — E119 Type 2 diabetes mellitus without complications: Secondary | ICD-10-CM | POA: Diagnosis not present

## 2015-09-08 DIAGNOSIS — Z7982 Long term (current) use of aspirin: Secondary | ICD-10-CM | POA: Diagnosis not present

## 2015-09-08 DIAGNOSIS — Y929 Unspecified place or not applicable: Secondary | ICD-10-CM | POA: Insufficient documentation

## 2015-09-08 DIAGNOSIS — I110 Hypertensive heart disease with heart failure: Secondary | ICD-10-CM | POA: Insufficient documentation

## 2015-09-08 DIAGNOSIS — W19XXXA Unspecified fall, initial encounter: Secondary | ICD-10-CM

## 2015-09-08 DIAGNOSIS — S0093XA Contusion of unspecified part of head, initial encounter: Secondary | ICD-10-CM | POA: Insufficient documentation

## 2015-09-08 DIAGNOSIS — Y999 Unspecified external cause status: Secondary | ICD-10-CM | POA: Diagnosis not present

## 2015-09-08 DIAGNOSIS — T148XXA Other injury of unspecified body region, initial encounter: Secondary | ICD-10-CM

## 2015-09-08 LAB — CBC WITH DIFFERENTIAL/PLATELET
Basophils Absolute: 0 10*3/uL (ref 0–0.1)
Basophils Relative: 0 %
EOS ABS: 0.1 10*3/uL (ref 0–0.7)
EOS PCT: 1 %
HCT: 42.9 % (ref 40.0–52.0)
HEMOGLOBIN: 14 g/dL (ref 13.0–18.0)
LYMPHS ABS: 1.4 10*3/uL (ref 1.0–3.6)
Lymphocytes Relative: 18 %
MCH: 27 pg (ref 26.0–34.0)
MCHC: 32.7 g/dL (ref 32.0–36.0)
MCV: 82.8 fL (ref 80.0–100.0)
MONO ABS: 0.8 10*3/uL (ref 0.2–1.0)
MONOS PCT: 10 %
Neutro Abs: 5.8 10*3/uL (ref 1.4–6.5)
Neutrophils Relative %: 71 %
PLATELETS: 195 10*3/uL (ref 150–440)
RBC: 5.18 MIL/uL (ref 4.40–5.90)
RDW: 17.3 % — ABNORMAL HIGH (ref 11.5–14.5)
WBC: 8 10*3/uL (ref 3.8–10.6)

## 2015-09-08 LAB — URINALYSIS COMPLETE WITH MICROSCOPIC (ARMC ONLY)
Bacteria, UA: NONE SEEN
Bilirubin Urine: NEGATIVE
Glucose, UA: NEGATIVE mg/dL
Hgb urine dipstick: NEGATIVE
KETONES UR: NEGATIVE mg/dL
Leukocytes, UA: NEGATIVE
Nitrite: NEGATIVE
PROTEIN: NEGATIVE mg/dL
Specific Gravity, Urine: 1.027 (ref 1.005–1.030)
pH: 5 (ref 5.0–8.0)

## 2015-09-08 LAB — BASIC METABOLIC PANEL
ANION GAP: 7 (ref 5–15)
BUN: 18 mg/dL (ref 6–20)
CO2: 25 mmol/L (ref 22–32)
Calcium: 9 mg/dL (ref 8.9–10.3)
Chloride: 106 mmol/L (ref 101–111)
Creatinine, Ser: 0.81 mg/dL (ref 0.61–1.24)
GFR calc Af Amer: >60 mL/min (ref >60)
GLUCOSE: 124 mg/dL — AB (ref 65–99)
POTASSIUM: 4 mmol/L (ref 3.5–5.1)
Sodium: 138 mmol/L (ref 135–145)

## 2015-09-08 LAB — TROPONIN I
TROPONIN I: 0.07 ng/mL — AB (ref <0.03)
TROPONIN I: 0.06 ng/mL — AB (ref <0.03)

## 2015-09-08 NOTE — ED Notes (Signed)
Pt from the [facility] via EMS, reports pt lost his balance and stumbled and hit the back of his head, pt at baseline, hx of  Dementia. Also C/O L hip pain

## 2015-09-08 NOTE — ED Provider Notes (Signed)
-----------------------------------------   11:11 PM on 09/08/2015 -----------------------------------------   Blood pressure 137/121, pulse 77, temperature 99.8 F (37.7 C), resp. rate 19, SpO2 98 %.  Assuming care from Dr. Derrill Kay of [facility] is a 82 y.o. male with a chief complaint of Fall .    In summary, 38M with h/o dementia and multiple falls who presents for another fall with head trauma. Imaging negative. Patient is at his baseline.  The current plan of care is to f/u BMP and troponin.  _________________________ 11:11 PM on 09/08/2015 -----------------------------------------  Initial troponin mildly elevated which has been elevated higher in the beginning of the month therefore patient was observed for 3 hours for a repeat troponin which is trending down. Patient remains at his baseline. Will dc home.    Nita Sickle, MD 09/09/15 1039

## 2015-09-08 NOTE — Discharge Instructions (Signed)
Please seek medical attention for any high fevers, chest pain, shortness of breath, change in behavior, persistent vomiting, bloody stool or any other new or concerning symptoms. ° °Fall Prevention in the Home  °Falls can cause injuries. They can happen to people of all ages. There are many things you can do to make your home safe and to help prevent falls.  °WHAT CAN I DO ON THE OUTSIDE OF MY HOME? °· Regularly fix the edges of walkways and driveways and fix any cracks. °· Remove anything that might make you trip as you walk through a door, such as a raised step or threshold. °· Trim any bushes or trees on the path to your home. °· Use bright outdoor lighting. °· Clear any walking paths of anything that might make someone trip, such as rocks or tools. °· Regularly check to see if handrails are loose or broken. Make sure that both sides of any steps have handrails. °· Any raised decks and porches should have guardrails on the edges. °· Have any leaves, snow, or ice cleared regularly. °· Use sand or salt on walking paths during winter. °· Clean up any spills in your garage right away. This includes oil or grease spills. °WHAT CAN I DO IN THE BATHROOM?  °· Use night lights. °· Install grab bars by the toilet and in the tub and shower. Do not use towel bars as grab bars. °· Use non-skid mats or decals in the tub or shower. °· If you need to sit down in the shower, use a plastic, non-slip stool. °· Keep the floor dry. Clean up any water that spills on the floor as soon as it happens. °· Remove soap buildup in the tub or shower regularly. °· Attach bath mats securely with double-sided non-slip rug tape. °· Do not have throw rugs and other things on the floor that can make you trip. °WHAT CAN I DO IN THE BEDROOM? °· Use night lights. °· Make sure that you have a light by your bed that is easy to reach. °· Do not use any sheets or blankets that are too big for your bed. They should not hang down onto the floor. °· Have a  firm chair that has side arms. You can use this for support while you get dressed. °· Do not have throw rugs and other things on the floor that can make you trip. °WHAT CAN I DO IN THE KITCHEN? °· Clean up any spills right away. °· Avoid walking on wet floors. °· Keep items that you use a lot in easy-to-reach places. °· If you need to reach something above you, use a strong step stool that has a grab bar. °· Keep electrical cords out of the way. °· Do not use floor polish or wax that makes floors slippery. If you must use wax, use non-skid floor wax. °· Do not have throw rugs and other things on the floor that can make you trip. °WHAT CAN I DO WITH MY STAIRS? °· Do not leave any items on the stairs. °· Make sure that there are handrails on both sides of the stairs and use them. Fix handrails that are broken or loose. Make sure that handrails are as long as the stairways. °· Check any carpeting to make sure that it is firmly attached to the stairs. Fix any carpet that is loose or worn. °· Avoid having throw rugs at the top or bottom of the stairs. If you do have throw rugs, attach   them to the floor with carpet tape. °· Make sure that you have a light switch at the top of the stairs and the bottom of the stairs. If you do not have them, ask someone to add them for you. °WHAT ELSE CAN I DO TO HELP PREVENT FALLS? °· Wear shoes that: °¨ Do not have high heels. °¨ Have rubber bottoms. °¨ Are comfortable and fit you well. °¨ Are closed at the toe. Do not wear sandals. °· If you use a stepladder: °¨ Make sure that it is fully opened. Do not climb a closed stepladder. °¨ Make sure that both sides of the stepladder are locked into place. °¨ Ask someone to hold it for you, if possible. °· Clearly mark and make sure that you can see: °¨ Any grab bars or handrails. °¨ First and last steps. °¨ Where the edge of each step is. °· Use tools that help you move around (mobility aids) if they are needed. These  include: °¨ Canes. °¨ Walkers. °¨ Scooters. °¨ Crutches. °· Turn on the lights when you go into a dark area. Replace any light bulbs as soon as they burn out. °· Set up your furniture so you have a clear path. Avoid moving your furniture around. °· If any of your floors are uneven, fix them. °· If there are any pets around you, be aware of where they are. °· Review your medicines with your doctor. Some medicines can make you feel dizzy. This can increase your chance of falling. °Ask your doctor what other things that you can do to help prevent falls. °  °This information is not intended to replace advice given to you by your health care provider. Make sure you discuss any questions you have with your health care provider. °  °Document Released: 12/01/2008 Document Revised: 06/21/2014 Document Reviewed: 03/11/2014 °Elsevier Interactive Patient Education ©2016 [employer]vier Inc. ° °

## 2015-09-08 NOTE — ED Provider Notes (Signed)
[facility] Emergency Department Provider Note    ____________________________________________  Time seen: ~1915  I have reviewed the triage vital signs and the nursing notes.   HISTORY  Chief Complaint Fall   History limited by: Dementia   HPI Nicolas Morris is a 82 y.o. male resents to the emergency department today via EMS because of a fall. Unfortunately the patient does have a dementia so cannot give a great history. There is a bit unclear when he fell. He does state that he hit the back of his head and he has a slight amount of pain there. On prompting and palpation he does also state that he has been having pain in his left hip. He denied any fevers. No chest pain or shortness of breath.    Past Medical History  Diagnosis Date  . Non-obstructive CAD     a. s/p multiple caths @ [facility] - last reportedly 06/2012;  b. 06/2013 Lexi MV: EF 51%, no ischemia/infarct.  Marland Kitchen NICM (nonischemic cardiomyopathy) (HCC)     a. EF reportedly < 30% 01/2013 ([facility]);  b. 01/2012 s/p MDT DTBB1D1 Viva S CRT-D;  c. 12/2012 Echo: EF 45-50%, inf/post HK, nl RV, mild MR.  . Diabetes mellitus without complication (HCC)   . A-fib (HCC)     a. Permanent, on Xarelto.  . H/O: GI bleed     a. 12/2012 EGD: HH, evidence of reflux. Erosive Gastritis. Nl duodenum;  b. 12/2012 Colonoscopy: Sev sigmoid and desc colon diverticulosis, transverse and ascending colon diverticulosis, nonbleeding internal hemorrhoids, no bleeding.  . Iron deficiency anemia   . Biventricular ICD (implantable cardioverter-defibrillator) -Medtronic 01/12/2013    a. 01/2012 [facility]:  01/2012 s/p MDT DTBB1D1 Viva S CRT-D.  Atrial port capped (afib).  . Diverticulosis     a. 12/2012 noted on colonoscopy.  . Hiatal hernia     a. 12/2012 noted on EGD.  Marland Kitchen Gastritis     a. 12/2012 noted on EGD.  Marland Kitchen Hypertension   . AICD (automatic cardioverter/defibrillator) present   . Presence of permanent cardiac pacemaker      Patient Active Problem List   Diagnosis Date Noted  . Fall   . Frequent falls 08/22/2015  . Overdose 06/22/2015  . Dementia, senile with depression 06/22/2015  . Iron deficiency anemia 10/24/2014  . GIB (gastrointestinal bleeding) 08/30/2014  . Hyponatremia 08/30/2014  . Elevated troponin 08/30/2014  . External hemorrhoids 08/30/2014  . Diverticulosis of colon without hemorrhage 08/30/2014  . Gastritis and gastroduodenitis 08/30/2014  . Acute posthemorrhagic anemia 08/25/2014  . Unsteady gait 03/04/2014  . NICM (nonischemic cardiomyopathy) (HCC) 07/20/2013  . Diabetes mellitus without complication (HCC)   . Diverticulosis   . Hiatal hernia   . Non-obstructive CAD   . Neck pain 04/21/2013  . Depression 01/26/2013  . Atrial fibrillation-permanent 01/12/2013  . Biventricular ICD (implantable cardioverter-defibrillator) -Medtronic 01/12/2013  . Congestive dilated cardiomyopathy (HCC) 01/12/2013  . GI bleed 01/12/2013  . Chronic combined systolic and diastolic CHF (congestive heart failure) (HCC) 01/12/2013  . Biventricular ICD (implantable cardioverter-defibrillator) -Medtronic 01/12/2013    Past Surgical History  Procedure Laterality Date  . Cardiac catheterization  2013    [facility]   . Cardiac catheterization  2011  . Defib implant  01/27/2012    Serial # [id number] H Model # [id number]  . Pacemaker insertion  01/27/2012  . Colonoscopy  32014  . Upper gastrointestinal endoscopy  2014  . Fetal blood transfusion  2014  . Cardiac defibrillator placement    .  Esophagogastroduodenoscopy (egd) with propofol N/A 08/29/2014    Procedure: ESOPHAGOGASTRODUODENOSCOPY (EGD) WITH PROPOFOL;  Surgeon: Elnita Maxwell, MD;  Location: [facility] ENDOSCOPY;  Service: Endoscopy;  Laterality: N/A;  . Colonoscopy with propofol N/A 08/29/2014    Procedure: COLONOSCOPY WITH PROPOFOL;  Surgeon: Elnita Maxwell, MD;  Location: [facility] ENDOSCOPY;  Service: Endoscopy;  Laterality: N/A;    Current  Outpatient Rx  Name  Route  Sig  Dispense  Refill  . acetaminophen (TYLENOL) 500 MG tablet   Oral   Take 500 mg by mouth 3 (three) times daily.         Marland Kitchen aspirin 81 MG chewable tablet   Oral   Chew by mouth daily.         Marland Kitchen atorvastatin (LIPITOR) 80 MG tablet   Oral   Take 80 mg by mouth daily.         . betamethasone dipropionate (DIPROLENE) 0.05 % cream   Topical   Apply 1 application topically 2 (two) times daily.         Marland Kitchen buPROPion (WELLBUTRIN XL) 300 MG 24 hr tablet   Oral   Take 300 mg by mouth daily.         . carvedilol (COREG) 3.125 MG tablet   Oral   Take 3.125 mg by mouth 2 (two) times daily with a meal.         . digoxin (LANOXIN) 0.25 MG tablet   Oral   Take 0.25 mg by mouth daily.         . finasteride (PROSCAR) 5 MG tablet   Oral   Take 5 mg by mouth daily.         . furosemide (LASIX) 20 MG tablet   Oral   Take 20 mg by mouth daily.         Marland Kitchen gabapentin (NEURONTIN) 600 MG tablet   Oral   Take 600 mg by mouth 3 (three) times daily.         Marland Kitchen lisinopril (PRINIVIL,ZESTRIL) 2.5 MG tablet   Oral   Take 2.5 mg by mouth daily.         . Melatonin 1 MG TABS   Oral   Take 1 mg by mouth at bedtime.         . metFORMIN (GLUCOPHAGE) 500 MG tablet   Oral   Take 1,000 mg by mouth 2 (two) times daily with a meal.          . pantoprazole (PROTONIX) 40 MG tablet   Oral   Take 40 mg by mouth daily.          . potassium chloride SA (K-DUR,KLOR-CON) 20 MEQ tablet   Oral   Take 20 mEq by mouth 2 (two) times daily.          . sertraline (ZOLOFT) 100 MG tablet   Oral   Take 100 mg by mouth daily.         . tamsulosin (FLOMAX) 0.4 MG CAPS capsule   Oral   Take 0.4 mg by mouth.         . traZODone (DESYREL) 100 MG tablet   Oral   Take 200 mg by mouth at bedtime.           Allergies Review of patient's allergies indicates no known allergies.  Family History  Problem Relation Age of Onset  . Family history  unknown: Yes    Social History Social History  Substance Use Topics  . Smoking  status: Former Smoker -- 3.00 packs/day for 45 years    Types: Cigarettes    Quit date: 01/13/2003  . Smokeless tobacco: None  . Alcohol Use: No    Review of Systems  Constitutional: Negative for fever. Cardiovascular: Negative for chest pain. Respiratory: Negative for shortness of breath. Gastrointestinal: Negative for abdominal pain, vomiting and diarrhea. Genitourinary: Negative for dysuria. Musculoskeletal: Positive for left hip pain Skin: Negative for rash. Neurological: Positive for headache 10-point ROS otherwise negative.  ____________________________________________   PHYSICAL EXAM:  VITAL SIGNS: ED Triage Vitals  Enc Vitals Group     BP 09/08/15 1908 137/121 mmHg     Pulse Rate 09/08/15 1910 77     Resp 09/08/15 1911 18     Temp 09/08/15 1911 99.8 F (37.7 C)     Temp src --      SpO2 09/08/15 1910 97 %     Weight --      Height --      Head Cir --      Peak Flow --      Pain Score 09/08/15 1907 10   Constitutional: Alert and oriented. Well appearing and in no distress. Eyes: Conjunctivae are normal. PERRL. Normal extraocular movements. ENT   Head: Normocephalic and atraumatic.   Nose: No congestion/rhinnorhea.   Mouth/Throat: Mucous membranes are moist.   Neck: No stridor. Hematological/Lymphatic/Immunilogical: No cervical lymphadenopathy. Cardiovascular: Normal rate, regular rhythm.  No murmurs, rubs, or gallops. Respiratory: Normal respiratory effort without tachypnea nor retractions. Breath sounds are clear and equal bilaterally. No wheezes/rales/rhonchi. Gastrointestinal: Soft and nontender. No distention.  Genitourinary: Deferred Musculoskeletal: Normal range of motion in all extremities. No joint effusions.  No lower extremity tenderness nor edema. Neurologic:  Normal speech and language. No gross focal neurologic deficits are appreciated.  Skin:   Skin is warm, dry and intact. No rash noted. Psychiatric: Mood and affect are normal. Speech and behavior are normal. Patient exhibits appropriate insight and judgment.  ____________________________________________    LABS (pertinent positives/negatives)  Labs pending  ____________________________________________   EKG  None  ____________________________________________    RADIOLOGY  Left hip  IMPRESSION: No acute osseous abnormality.  Possible constipation.  CT head/cervical spine IMPRESSION: 1. No acute intracranial abnormality. 2. Cervical spondylosis, without acute fracture or subluxation.  ____________________________________________   PROCEDURES  Procedure(s) performed: None  Critical Care performed: No  ____________________________________________   INITIAL IMPRESSION / ASSESSMENT AND PLAN / ED COURSE  Pertinent labs & imaging results that were available during my care of the patient were reviewed by me and considered in my medical decision making (see chart for details).  Patient presented to the emergency department today because of concerns for pain after a fall. Patient complaining primarily of pain to the back of his head and neck where there is a small contusion. CT imaging of the head and cervical spine negative. Additionally imaging of the left hip negative. Did send some blood work to evaluate for any obvious cause of the fall. Will prepare paperwork in the event that the blood work and urine is concerning.  ____________________________________________   FINAL CLINICAL IMPRESSION(S) / ED DIAGNOSES  Final diagnoses:  Fall, initial encounter  Bruise     Note: This dictation was prepared with Office manager. Any transcriptional errors that result from this process are unintentional    Phineas Semen, MD 09/08/15 2033

## 2015-09-09 ENCOUNTER — Emergency Department: Payer: Medicare Other

## 2015-09-09 ENCOUNTER — Encounter: Payer: Self-pay | Admitting: Emergency Medicine

## 2015-09-09 ENCOUNTER — Emergency Department
Admission: EM | Admit: 2015-09-09 | Discharge: 2015-09-10 | Disposition: A | Discharge status: Home / Self Care | Payer: Medicare Other | Attending: Emergency Medicine | Admitting: Emergency Medicine

## 2015-09-09 DIAGNOSIS — S32018A Other fracture of first lumbar vertebra, initial encounter for closed fracture: Secondary | ICD-10-CM | POA: Diagnosis not present

## 2015-09-09 DIAGNOSIS — Z87891 Personal history of nicotine dependence: Secondary | ICD-10-CM | POA: Diagnosis not present

## 2015-09-09 DIAGNOSIS — I110 Hypertensive heart disease with heart failure: Secondary | ICD-10-CM | POA: Insufficient documentation

## 2015-09-09 DIAGNOSIS — I5042 Chronic combined systolic (congestive) and diastolic (congestive) heart failure: Secondary | ICD-10-CM | POA: Insufficient documentation

## 2015-09-09 DIAGNOSIS — Y9301 Activity, walking, marching and hiking: Secondary | ICD-10-CM | POA: Insufficient documentation

## 2015-09-09 DIAGNOSIS — Z95810 Presence of automatic (implantable) cardiac defibrillator: Secondary | ICD-10-CM | POA: Insufficient documentation

## 2015-09-09 DIAGNOSIS — Z79899 Other long term (current) drug therapy: Secondary | ICD-10-CM | POA: Diagnosis not present

## 2015-09-09 DIAGNOSIS — Y999 Unspecified external cause status: Secondary | ICD-10-CM | POA: Insufficient documentation

## 2015-09-09 DIAGNOSIS — W1839XA Other fall on same level, initial encounter: Secondary | ICD-10-CM | POA: Insufficient documentation

## 2015-09-09 DIAGNOSIS — S32000A Wedge compression fracture of unspecified lumbar vertebra, initial encounter for closed fracture: Secondary | ICD-10-CM

## 2015-09-09 DIAGNOSIS — I2510 Atherosclerotic heart disease of native coronary artery without angina pectoris: Secondary | ICD-10-CM | POA: Diagnosis not present

## 2015-09-09 DIAGNOSIS — Y929 Unspecified place or not applicable: Secondary | ICD-10-CM | POA: Insufficient documentation

## 2015-09-09 DIAGNOSIS — Z7982 Long term (current) use of aspirin: Secondary | ICD-10-CM | POA: Insufficient documentation

## 2015-09-09 DIAGNOSIS — E119 Type 2 diabetes mellitus without complications: Secondary | ICD-10-CM | POA: Diagnosis not present

## 2015-09-09 DIAGNOSIS — M545 Low back pain: Secondary | ICD-10-CM | POA: Diagnosis present

## 2015-09-09 DIAGNOSIS — W19XXXA Unspecified fall, initial encounter: Secondary | ICD-10-CM

## 2015-09-09 LAB — DIGOXIN LEVEL: DIGOXIN LVL: 0.9 ng/mL (ref 0.8–2.0)

## 2015-09-09 MED ORDER — TRAMADOL HCL 50 MG PO TABS: 50.0000 mg | ORAL_TABLET | Freq: Four times a day (QID) | ORAL | Status: DC | PRN

## 2015-09-09 NOTE — ED Notes (Signed)
Pt repositioned in bed, call bell at right side. Pt with fall band and yellow socks in place, bed in low position with siderails up x2. Pt denies futher needs at this time.

## 2015-09-09 NOTE — Discharge Instructions (Signed)
Please seek medical attention for any high fevers, chest pain, shortness of breath, change in behavior, persistent vomiting, bloody stool or any other new or concerning symptoms.   Lumbar Fracture A lumbar fracture is a break in one of the bones of the lower back. Lumbar fractures range in severity. Severe fractures can damage the spinal cord. CAUSES This condition may be caused by:  A fall (common).  A car accident (common).  A gunshot wound.  A hard, direct hit to the back.  Osteoporosis. SYMPTOMS The main symptom of this condition is severe pain in the lower back. If a fracture is complex or severe, there may also be:  A misshapen or swollen area on the lower back.  A limited ability to move an area of the lower back.  An inability to empty the bladder or bowel.  A loss of strength or sensation in the legs, feet, and toes.  Paralysis. DIAGNOSIS This condition is diagnosed based on:  A physical exam.  Symptoms and what happened just before they developed.  The results of imaging tests, such as an X-ray, CT scan, or MRI. If your nerves have been damaged, you may also have other tests to find out how much damage there is. TREATMENT Treatment for this condition depends on the specifics of the injury. Most fractures can be treated with:  A back brace.  Bed rest and activity restrictions.  Pain medicine.  Physical therapy. Fractures that are complex, involve multiple bones, or make the spine unstable may require surgery to remove pressure from the nerves or spinal cord and to stabilize the broken pieces of bone. During recovery, it is normal to have pain and stiffness in the back for weeks. HOME CARE INSTRUCTIONS Medicines  Take medicines only as directed by your health care provider.  Do not drive or operate heavy machinery while taking pain medicine. Activity  Stay in bed for as long as directed by your health care provider.  If you were shown how to do any  exercises to improve motion and strength in your back, do them as directed by your health care provider.  Return to your normal activities as directed by your health care provider. Ask your health care provider what activities are safe for you. General Instructions  If you were given a neck brace or back brace, wear it as directed by your health care provider.  Keep all follow-up visits as directed by your health care provider. This is important. Failure to follow-up as recommended could result in permanent injury, disability, and long-lasting (chronic) pain. SEEK MEDICAL CARE IF:  Your pain does not improve over time.  You have a persistent cough.  You cannot return to your normal activities as planned or expected. SEEK IMMEDIATE MEDICAL CARE IF:  You have severe pain or your pain suddenly gets worse.  You are unable to move.  You have numbness, tingling, weakness, or paralysis in any part of your body.  You cannot control your bladder or bowel.  You have difficulty breathing.  You have a fever.  You have pain in your chest or abdomen.  You vomit.   This information is not intended to replace advice given to you by your health care provider. Make sure you discuss any questions you have with your health care provider.   Document Released: 05/22/2006 Document Revised: 06/21/2014 Document Reviewed: 01/31/2014 Elsevier Interactive Patient Education [employer]

## 2015-09-09 NOTE — ED Provider Notes (Signed)
[facility] Emergency Department Provider Note   ____________________________________________  Time seen: ~2145  I have reviewed the triage vital signs and the nursing notes.   HISTORY  Chief Complaint Fall   History limited by: Not Limited   HPI Nicolas Morris is a 82 y.o. male who presents to the emergency department today because of concerns for a fall and low back pain. Patient states that he was walking when he got his ankle twisted. He states he fell onto his back. He is primarily complaining of pain in his lower back. He denies any head injury or loss of consciousness. The patient states that he did not have any chest pain or palpitations. Denies any recent fevers.    Past Medical History  Diagnosis Date  . Non-obstructive CAD     a. s/p multiple caths @ [facility] - last reportedly 06/2012;  b. 06/2013 Lexi MV: EF 51%, no ischemia/infarct.  Marland Kitchen NICM (nonischemic cardiomyopathy) (HCC)     a. EF reportedly < 30% 01/2013 ([facility]);  b. 01/2012 s/p MDT DTBB1D1 Viva S CRT-D;  c. 12/2012 Echo: EF 45-50%, inf/post HK, nl RV, mild MR.  . Diabetes mellitus without complication (HCC)   . A-fib (HCC)     a. Permanent, on Xarelto.  . H/O: GI bleed     a. 12/2012 EGD: HH, evidence of reflux. Erosive Gastritis. Nl duodenum;  b. 12/2012 Colonoscopy: Sev sigmoid and desc colon diverticulosis, transverse and ascending colon diverticulosis, nonbleeding internal hemorrhoids, no bleeding.  . Iron deficiency anemia   . Biventricular ICD (implantable cardioverter-defibrillator) -Medtronic 01/12/2013    a. 01/2012 [facility]:  01/2012 s/p MDT DTBB1D1 Viva S CRT-D.  Atrial port capped (afib).  . Diverticulosis     a. 12/2012 noted on colonoscopy.  . Hiatal hernia     a. 12/2012 noted on EGD.  Marland Kitchen Gastritis     a. 12/2012 noted on EGD.  Marland Kitchen Hypertension   . AICD (automatic cardioverter/defibrillator) present   . Presence of permanent cardiac pacemaker     Patient Active  Problem List   Diagnosis Date Noted  . Fall   . Frequent falls 08/22/2015  . Overdose 06/22/2015  . Dementia, senile with depression 06/22/2015  . Iron deficiency anemia 10/24/2014  . GIB (gastrointestinal bleeding) 08/30/2014  . Hyponatremia 08/30/2014  . Elevated troponin 08/30/2014  . External hemorrhoids 08/30/2014  . Diverticulosis of colon without hemorrhage 08/30/2014  . Gastritis and gastroduodenitis 08/30/2014  . Acute posthemorrhagic anemia 08/25/2014  . Unsteady gait 03/04/2014  . NICM (nonischemic cardiomyopathy) (HCC) 07/20/2013  . Diabetes mellitus without complication (HCC)   . Diverticulosis   . Hiatal hernia   . Non-obstructive CAD   . Neck pain 04/21/2013  . Depression 01/26/2013  . Atrial fibrillation-permanent 01/12/2013  . Biventricular ICD (implantable cardioverter-defibrillator) -Medtronic 01/12/2013  . Congestive dilated cardiomyopathy (HCC) 01/12/2013  . GI bleed 01/12/2013  . Chronic combined systolic and diastolic CHF (congestive heart failure) (HCC) 01/12/2013  . Biventricular ICD (implantable cardioverter-defibrillator) -Medtronic 01/12/2013    Past Surgical History  Procedure Laterality Date  . Cardiac catheterization  2013    [facility]   . Cardiac catheterization  2011  . Defib implant  01/27/2012    Serial # [id number] H Model # [id number]  . Pacemaker insertion  01/27/2012  . Colonoscopy  32014  . Upper gastrointestinal endoscopy  2014  . Fetal blood transfusion  2014  . Cardiac defibrillator placement    . Esophagogastroduodenoscopy (egd) with propofol N/A 08/29/2014  Procedure: ESOPHAGOGASTRODUODENOSCOPY (EGD) WITH PROPOFOL;  Surgeon: Elnita Maxwell, MD;  Location: [facility] ENDOSCOPY;  Service: Endoscopy;  Laterality: N/A;  . Colonoscopy with propofol N/A 08/29/2014    Procedure: COLONOSCOPY WITH PROPOFOL;  Surgeon: Elnita Maxwell, MD;  Location: [facility] ENDOSCOPY;  Service: Endoscopy;  Laterality: N/A;    Current Outpatient Rx  Name  Route   Sig  Dispense  Refill  . acetaminophen (TYLENOL) 500 MG tablet   Oral   Take 500 mg by mouth 3 (three) times daily.         Marland Kitchen aspirin 81 MG chewable tablet   Oral   Chew by mouth daily.         Marland Kitchen atorvastatin (LIPITOR) 80 MG tablet   Oral   Take 80 mg by mouth daily.         . betamethasone dipropionate (DIPROLENE) 0.05 % cream   Topical   Apply 1 application topically 2 (two) times daily.         Marland Kitchen buPROPion (WELLBUTRIN XL) 300 MG 24 hr tablet   Oral   Take 300 mg by mouth daily.         . carvedilol (COREG) 3.125 MG tablet   Oral   Take 3.125 mg by mouth 2 (two) times daily with a meal.         . digoxin (LANOXIN) 0.25 MG tablet   Oral   Take 0.25 mg by mouth daily.         . finasteride (PROSCAR) 5 MG tablet   Oral   Take 5 mg by mouth daily.         . furosemide (LASIX) 20 MG tablet   Oral   Take 20 mg by mouth daily.         Marland Kitchen gabapentin (NEURONTIN) 600 MG tablet   Oral   Take 600 mg by mouth 3 (three) times daily.         Marland Kitchen lisinopril (PRINIVIL,ZESTRIL) 2.5 MG tablet   Oral   Take 2.5 mg by mouth daily.         . Melatonin 1 MG TABS   Oral   Take 1 mg by mouth at bedtime.         . metFORMIN (GLUCOPHAGE) 500 MG tablet   Oral   Take 1,000 mg by mouth 2 (two) times daily with a meal.          . pantoprazole (PROTONIX) 40 MG tablet   Oral   Take 40 mg by mouth daily.          . potassium chloride SA (K-DUR,KLOR-CON) 20 MEQ tablet   Oral   Take 20 mEq by mouth 2 (two) times daily.          . sertraline (ZOLOFT) 100 MG tablet   Oral   Take 100 mg by mouth daily.         . tamsulosin (FLOMAX) 0.4 MG CAPS capsule   Oral   Take 0.4 mg by mouth.         . traZODone (DESYREL) 100 MG tablet   Oral   Take 200 mg by mouth at bedtime.           Allergies Review of patient's allergies indicates no known allergies.  Family History  Problem Relation Age of Onset  . Family history unknown: Yes    Social  History Social History  Substance Use Topics  . Smoking status: Former Smoker -- 3.00 packs/day for 45 years  Types: Cigarettes    Quit date: 01/13/2003  . Smokeless tobacco: None  . Alcohol Use: No    Review of Systems  Constitutional: Negative for fever. Cardiovascular: Negative for chest pain. Respiratory: Negative for shortness of breath. Gastrointestinal: Negative for abdominal pain, vomiting and diarrhea. Genitourinary: Negative for dysuria. Musculoskeletal: Positive for low back pain. Skin: Negative for rash. Neurological: Negative for headaches, focal weakness or numbness.  10-point ROS otherwise negative.  ____________________________________________   PHYSICAL EXAM:  VITAL SIGNS: ED Triage Vitals  Enc Vitals Group     BP 09/09/15 1931 102/83 mmHg     Pulse Rate 09/09/15 1931 79     Resp 09/09/15 1931 23     Temp 09/09/15 1931 99.3 F (37.4 C)     Temp Source 09/09/15 1931 Oral     SpO2 09/09/15 1931 95 %     Weight 09/09/15 1931 233 lb (105.688 kg)     Height 09/09/15 1931  (1.88 m)     Head Cir --      Peak Flow --      Pain Score 09/09/15 1933 8   Constitutional: Alert and oriented. Well appearing and in no distress. Eyes: Conjunctivae are normal. PERRL. Normal extraocular movements. ENT   Head: Normocephalic and atraumatic.   Nose: No congestion/rhinnorhea.   Mouth/Throat: Mucous membranes are moist.   Neck: No stridor. Hematological/Lymphatic/Immunilogical: No cervical lymphadenopathy. Cardiovascular: Normal rate, regular rhythm.  No murmurs, rubs, or gallops. Respiratory: Normal respiratory effort without tachypnea nor retractions. Breath sounds are clear and equal bilaterally. No wheezes/rales/rhonchi. Gastrointestinal: Soft and nontender. No distention.  Genitourinary: Deferred Musculoskeletal: Normal range of motion in all extremities. No joint effusions.  No lower extremity tenderness nor edema. Neurologic:  Normal speech  and language. No gross focal neurologic deficits are appreciated.  Skin:  Skin is warm, dry and intact. No rash noted. Psychiatric: Mood and affect are normal. Speech and behavior are normal. Patient exhibits appropriate insight and judgment.  ____________________________________________    LABS (pertinent positives/negatives)  Labs Reviewed  DIGOXIN LEVEL     ____________________________________________   EKG  I, Phineas Semen, attending physician, personally viewed and interpreted this EKG  EKG Time: 1932 Rate: 76 Rhythm: ventricular paced rhythm Axis: left axis  Intervals: qtc 434 QRS: wide ST changes: no st elevation equivalent Impression: abnormal ekg   ____________________________________________    RADIOLOGY  Lumbar Spine IMPRESSION: L1 and likely T12 acute compression fractures with mild height loss.  ____________________________________________   PROCEDURES  Procedure(s) performed: None  Critical Care performed: No  ____________________________________________   INITIAL IMPRESSION / ASSESSMENT AND PLAN / ED COURSE  Pertinent labs & imaging results that were available during my care of the patient were reviewed by me and considered in my medical decision making (see chart for details).  Patient presented to the emergency department today after a fall. I evaluated the patient yesterday for similar complaint. Blood work yesterday did show mild elevation troponin which was improving. Patient has had slight elevation in his troponin last admission which was not thought to be cardiac by the cardiology team. Digoxin level normal today. Will get lumbar spine x-ray to evaluate.  ----------------------------------------- 11:20 PM on 09/09/2015 -----------------------------------------  Lumbar spine x-ray shows an acute L1 fracture and probable T12 fracture. Will Leist patient in lumbosacral course. Will have patient follow-up with  orthopedics.  ____________________________________________   FINAL CLINICAL IMPRESSION(S) / ED DIAGNOSES  Final diagnoses:  Fall, initial encounter  Lumbar compression fracture, closed, initial encounter (HCC)  Note: This dictation was prepared with Dragon dictation. Any transcriptional errors that result from this process are unintentional    Phineas Semen, MD 09/09/15 2321

## 2015-09-09 NOTE — ED Notes (Signed)
Per [facility] pt came from the [facility] due to fall this evening. Per EMS, BP 162/75, O2 90's, and PR 94. Pt reports falling with no LOC and landing "right on his butt". Pt denies chest pain or SOB. Pt remembers fall and is alert and oriented at baseline at this time.

## 2015-09-09 NOTE — ED Notes (Signed)
Report to Ronie Spies, med tech at the [facility]. Per donna the [facility] is unable to transport to back to facility from ed. Call placed to pt's spouse susan who is unable to transport pt back to the [facility]. Per donna at the [facility] pt to return to [facility] via ambulance

## 2015-09-09 NOTE — ED Notes (Signed)
Pt repositioned in bed for comfort. Call bell remains at side. Pt has yellow arm band in place, but wishes to remove yellow socks. Warm blankets provided for comfort. Call bell at left side, bed in low postion.

## 2015-09-10 ENCOUNTER — Encounter: Payer: Self-pay | Admitting: Emergency Medicine

## 2015-09-10 ENCOUNTER — Emergency Department
Admission: EM | Admit: 2015-09-10 | Discharge: 2015-09-10 | Disposition: A | Discharge status: Home / Self Care | Payer: Medicare Other | Source: Home / Self Care | Attending: Emergency Medicine | Admitting: Emergency Medicine

## 2015-09-10 DIAGNOSIS — I2510 Atherosclerotic heart disease of native coronary artery without angina pectoris: Secondary | ICD-10-CM

## 2015-09-10 DIAGNOSIS — Z7982 Long term (current) use of aspirin: Secondary | ICD-10-CM

## 2015-09-10 DIAGNOSIS — Y999 Unspecified external cause status: Secondary | ICD-10-CM

## 2015-09-10 DIAGNOSIS — Y939 Activity, unspecified: Secondary | ICD-10-CM | POA: Insufficient documentation

## 2015-09-10 DIAGNOSIS — Y929 Unspecified place or not applicable: Secondary | ICD-10-CM

## 2015-09-10 DIAGNOSIS — I110 Hypertensive heart disease with heart failure: Secondary | ICD-10-CM | POA: Insufficient documentation

## 2015-09-10 DIAGNOSIS — I5042 Chronic combined systolic (congestive) and diastolic (congestive) heart failure: Secondary | ICD-10-CM

## 2015-09-10 DIAGNOSIS — E119 Type 2 diabetes mellitus without complications: Secondary | ICD-10-CM

## 2015-09-10 DIAGNOSIS — W19XXXA Unspecified fall, initial encounter: Secondary | ICD-10-CM | POA: Insufficient documentation

## 2015-09-10 DIAGNOSIS — Z79899 Other long term (current) drug therapy: Secondary | ICD-10-CM

## 2015-09-10 DIAGNOSIS — Z87891 Personal history of nicotine dependence: Secondary | ICD-10-CM | POA: Insufficient documentation

## 2015-09-10 DIAGNOSIS — Z95810 Presence of automatic (implantable) cardiac defibrillator: Secondary | ICD-10-CM

## 2015-09-10 DIAGNOSIS — S32018A Other fracture of first lumbar vertebra, initial encounter for closed fracture: Secondary | ICD-10-CM | POA: Diagnosis not present

## 2015-09-10 DIAGNOSIS — M545 Low back pain, unspecified: Secondary | ICD-10-CM

## 2015-09-10 NOTE — ED Notes (Signed)
Pt to be discharged back to nursing home. tts to call wife

## 2015-09-10 NOTE — ED Notes (Signed)
Wife susan [phone]

## 2015-09-10 NOTE — ED Notes (Signed)
Pt given lunch tray and ate all of it

## 2015-09-10 NOTE — ED Provider Notes (Addendum)
[facility] Emergency Department Provider Note  ____________________________________________   I have reviewed the triage vital signs and the nursing notes.   HISTORY  Chief Complaint Back Pain and Fall    HPI Nicolas Morris is a 82 y.o. male who was discharged yesterday after suffering a back injury. He was sent by the nursing home because apparently they cannot take care of him for some reason. Patient has no complaints. He states he has "a little" back pain, which is better after he took an Ultram earlier today. He has not fallen since he went home. He would like to go home.     Past Medical History:  Diagnosis Date  . A-fib (HCC)    a. Permanent, on Xarelto.  Marland Kitchen AICD (automatic cardioverter/defibrillator) present   . Biventricular ICD (implantable cardioverter-defibrillator) -Medtronic 01/12/2013   a. 01/2012 [facility]:  01/2012 s/p MDT DTBB1D1 Viva S CRT-D.  Atrial port capped (afib).  . Diabetes mellitus without complication (HCC)   . Diverticulosis    a. 12/2012 noted on colonoscopy.  . Gastritis    a. 12/2012 noted on EGD.  . H/O: GI bleed    a. 12/2012 EGD: HH, evidence of reflux. Erosive Gastritis. Nl duodenum;  b. 12/2012 Colonoscopy: Sev sigmoid and desc colon diverticulosis, transverse and ascending colon diverticulosis, nonbleeding internal hemorrhoids, no bleeding.  . Hiatal hernia    a. 12/2012 noted on EGD.  Marland Kitchen Hypertension   . Iron deficiency anemia   . NICM (nonischemic cardiomyopathy) (HCC)    a. EF reportedly < 30% 01/2013 ([facility]);  b. 01/2012 s/p MDT DTBB1D1 Viva S CRT-D;  c. 12/2012 Echo: EF 45-50%, inf/post HK, nl RV, mild MR.  . Non-obstructive CAD    a. s/p multiple caths @ [facility] - last reportedly 06/2012;  b. 06/2013 Lexi MV: EF 51%, no ischemia/infarct.  . Presence of permanent cardiac pacemaker     Patient Active Problem List   Diagnosis Date Noted  . Fall   . Frequent falls 08/22/2015  . Overdose 06/22/2015  .  Dementia, senile with depression 06/22/2015  . Iron deficiency anemia 10/24/2014  . GIB (gastrointestinal bleeding) 08/30/2014  . Hyponatremia 08/30/2014  . Elevated troponin 08/30/2014  . External hemorrhoids 08/30/2014  . Diverticulosis of colon without hemorrhage 08/30/2014  . Gastritis and gastroduodenitis 08/30/2014  . Acute posthemorrhagic anemia 08/25/2014  . Unsteady gait 03/04/2014  . NICM (nonischemic cardiomyopathy) (HCC) 07/20/2013  . Diabetes mellitus without complication (HCC)   . Diverticulosis   . Hiatal hernia   . Non-obstructive CAD   . Neck pain 04/21/2013  . Depression 01/26/2013  . Atrial fibrillation-permanent 01/12/2013  . Biventricular ICD (implantable cardioverter-defibrillator) -Medtronic 01/12/2013  . Congestive dilated cardiomyopathy (HCC) 01/12/2013  . GI bleed 01/12/2013  . Chronic combined systolic and diastolic CHF (congestive heart failure) (HCC) 01/12/2013  . Biventricular ICD (implantable cardioverter-defibrillator) -Medtronic 01/12/2013    Past Surgical History:  Procedure Laterality Date  . CARDIAC CATHETERIZATION  2013   [facility]   . CARDIAC CATHETERIZATION  2011  . CARDIAC DEFIBRILLATOR PLACEMENT    . COLONOSCOPY  32014  . COLONOSCOPY WITH PROPOFOL N/A 08/29/2014   Procedure: COLONOSCOPY WITH PROPOFOL;  Surgeon: Elnita Maxwell, MD;  Location: [facility] ENDOSCOPY;  Service: Endoscopy;  Laterality: N/A;  . Defib implant  01/27/2012   Serial # [id number] H Model # [id number]  . ESOPHAGOGASTRODUODENOSCOPY (EGD) WITH PROPOFOL N/A 08/29/2014   Procedure: ESOPHAGOGASTRODUODENOSCOPY (EGD) WITH PROPOFOL;  Surgeon: Elnita Maxwell, MD;  Location: [facility] ENDOSCOPY;  Service: Endoscopy;  Laterality: N/A;  . FETAL BLOOD TRANSFUSION  2014  . PACEMAKER INSERTION  01/27/2012  . UPPER GASTROINTESTINAL ENDOSCOPY  2014    Current Outpatient Rx  . Order #: [id number] Class: Historical Med  . Order #: [id number] Class: Historical Med  . Order #: [id number] Class:  Historical Med  . Order #: [id number] Class: Historical Med  . Order #: [id number] Class: Historical Med  . Order #: [id number] Class: Historical Med  . Order #: [id number] Class: Historical Med  . Order #: [id number] Class: Historical Med  . Order #: [id number] Class: Historical Med  . Order #: [id number] Class: Historical Med  . Order #: [id number] Class: Historical Med  . Order #: [id number] Class: Historical Med  . Order #: [id number] Class: Historical Med  . Order #: [id number] Class: Historical Med  . Order #: [id number] Class: Print    Allergies Review of patient's allergies indicates no known allergies.  Family History  Problem Relation Age of Onset  . Family history unknown: Yes    Social History Social History  Substance Use Topics  . Smoking status: Former Smoker    Packs/day: 3.00    Years: 45.00    Types: Cigarettes    Quit date: 01/13/2003  . Smokeless tobacco: Never Used  . Alcohol use No    Review of Systems Constitutional: No fever/chills Eyes: No visual changes. ENT: No sore throat. No stiff neck no neck pain Cardiovascular: Denies chest pain. Respiratory: Denies shortness of breath. Gastrointestinal:   no vomiting.  No diarrhea.  No constipation. Genitourinary: Negative for dysuria. Musculoskeletal: Negative lower extremity swelling Skin: Negative for rash. Neurological: Negative for severe headaches, focal weakness or numbness. 10-point ROS otherwise negative.  ____________________________________________   PHYSICAL EXAM:  VITAL SIGNS: ED Triage Vitals  Enc Vitals Group     BP 09/10/15 1021 (!) 158/78     Pulse Rate 09/10/15 1021 (!) 58     Resp 09/10/15 1021 18     Temp 09/10/15 1021 98.8 F (37.1 C)     Temp Source 09/10/15 1021 Oral     SpO2 09/10/15 1021 95 %     Weight 09/10/15 1021 233 lb (105.7 kg)     Height 09/10/15 1021  (1.88 m)     Head Circumference --      Peak Flow --      Pain Score 09/10/15 1022 8     Pain Loc --      Pain Edu? --       Excl. in GC? --     Constitutional: Alert and oriented. Well appearing and in no acute distress. Eyes: Conjunctivae are normal. PERRL. EOMI. Head: Atraumatic. Nose: No congestion/rhinnorhea. Mouth/Throat: Mucous membranes are moist.  Oropharynx non-erythematous. Neck: No stridor.   Nontender with no meningismus Cardiovascular: Normal rate, regular rhythm. Grossly normal heart sounds.  Good peripheral circulation. Respiratory: Normal respiratory effort.  No retractions. Lungs CTAB. Abdominal: Soft and nontender. No distention. No guarding no rebound Back:  There is no focal tenderness or step off.  Is minimal midline tenderness at the patient noted in the L1-L2 region there are no lesions noted. there is no CVA tenderness Musculoskeletal: No lower extremity tenderness, no upper extremity tenderness. No joint effusions, no DVT signs strong distal pulses no edema Neurologic:  Normal speech and language. No gross focal neurologic deficits are appreciated.  Skin:  Skin is warm, dry and intact. No rash noted. Psychiatric: Mood and affect are normal. Speech and behavior are normal.  ____________________________________________   LABS (all labs ordered are listed, but  only abnormal results are displayed)  Labs Reviewed - No data to display ____________________________________________  EKG  I personally interpreted any EKGs ordered by me or triage  ____________________________________________  RADIOLOGY  I reviewed any imaging ordered by me or triage that were performed during my shift and, if possible, patient and/or family made aware of any abnormal findings. ____________________________________________   PROCEDURES  Procedure(s) performed: None  Procedures  Critical Care performed: None  ____________________________________________   INITIAL IMPRESSION / ASSESSMENT AND PLAN / ED COURSE  Pertinent labs & imaging results that were available during my care of the  patient were reviewed by me and considered in my medical decision making (see chart for details).  Patient with back pain after a compression fracture who is neurologically intact with no further injury presents today with no complaints except for the fact that he would like to go home. Apparently, the nursing home does not want him there at this time. This is certainly something on my clinical result. I do not see any evidence of acute cauda equina syndrome or other neurologic decompensation at this time. His noncontrast bowel or bladder and no neurologic complaints. We will keep his pain under control and discussed further with nursing home the reasons for his being here.  ----------------------------------------- 1:57 PM on 09/10/2015 -----------------------------------------  Patient with no evidence of cauda equina syndrome, no numbness no weakness able to ambulate with a walker at baseline, he refuses further workup and wants to go home, given that the patient has no further complaints and is adequately pain controlled on his home O2 I am with no discomfort at this time, I will discharge him back to the facility.   Clinical Course   ____________________________________________   FINAL CLINICAL IMPRESSION(S) / ED DIAGNOSES  Final diagnoses:  None      This chart was dictated using voice recognition software.  Despite best efforts to proofread,  errors can occur which can change meaning.      Jeanmarie Plant, MD 09/10/15 1334    Jeanmarie Plant, MD 09/10/15 1357

## 2015-09-10 NOTE — ED Notes (Signed)
Arranged for pt to be transported back to the [facility] via ems. tts informed wife and sw will call the [facility] to inform him of his return

## 2015-09-10 NOTE — Progress Notes (Addendum)
LCSW and TTS consulted  LCSW called the [place] and spoke Marylene Land and she will have her director Dustin call me back. She does not know his number. This patient has been brought to the hospital because I fall a lot and they cant take care of me.  The nurse states a report that the patient has a lumbar fracture and they are unable to care for him. LCSW advocated for patient and will consult with EDP to see if patient is going to be admitted.  Spoke to patients wife Nicolas Morris- [phone] and she agrees that patient can return to the [facility] and she confirmed she never received written notice that he is not allowed back. LCSW received call from [facility] director and was instructed have patient returned to [facility]. LCSW spoke to Tripp at the [facility] [place] and this was is was confirmed by Marylene Land.  [employer] LCSW [phone]

## 2015-09-10 NOTE — ED Notes (Signed)
Pt is awaiting tts consult to assist with pt being kicked out of his nursing home.

## 2015-09-10 NOTE — ED Triage Notes (Signed)
Pt to ED via EMS from the [facility].  Pt fell a couple days ago and was seen in the ED and discharged back to the [facility].  Pt was dx with lumbar compression fracture, c/o of back pain today, denies any other complaints at this time.  Pt states he has been falling due to dizziness for the past couple of months.  Pt is A&Ox4, speaking in complete and coherent sentences and in NAD at this time.

## 2015-09-10 NOTE — BH Assessment (Signed)
Tele Assessment Note   Nicolas Morris is an 82 y.o. male., who presents to [facility] for medical purposes and complaints of dizziness earlier with frequent falls. Patient primary concern is not expressed, and patient expressed no mental health reasons.  Patient states that he stays at an apartment and was with his wife when re-directed and asked for clarity patient clarified and explained that he does stay at [facility] retirement facility. Patient acknowledges history of memory loss and dimentia, along with frequent falls. Patient denies history of psychotic symptoms.  Patient denies current or history of SI/HI or AVH. Patient denies hx. Of substance abuse as well as inpatient and outpatient psychiatric treatment and states that he receives care at [facility] and that his Dr. Is Doristine Mango.  Patient is dressed in hospital gown and is alert and oriented x4. Patient speech was within normal limits and motor behavior appeared normal. Patient thought process is coherent. Patient does not appear to be responding to internal stimuli. Patient was cooperative throughout the assessment.   Diagnosis: Dimentia [via MAR history]  Past Medical History:  Past Medical History:  Diagnosis Date  . A-fib (HCC)    a. Permanent, on Xarelto.  Marland Kitchen AICD (automatic cardioverter/defibrillator) present   . Biventricular ICD (implantable cardioverter-defibrillator) -Medtronic 01/12/2013   a. 01/2012 [facility]:  01/2012 s/p MDT DTBB1D1 Viva S CRT-D.  Atrial port capped (afib).  . Diabetes mellitus without complication (HCC)   . Diverticulosis    a. 12/2012 noted on colonoscopy.  . Gastritis    a. 12/2012 noted on EGD.  . H/O: GI bleed    a. 12/2012 EGD: HH, evidence of reflux. Erosive Gastritis. Nl duodenum;  b. 12/2012 Colonoscopy: Sev sigmoid and desc colon diverticulosis, transverse and ascending colon diverticulosis, nonbleeding internal hemorrhoids, no bleeding.  . Hiatal hernia    a. 12/2012 noted on EGD.  Marland Kitchen Hypertension    . Iron deficiency anemia   . NICM (nonischemic cardiomyopathy) (HCC)    a. EF reportedly < 30% 01/2013 ([facility]);  b. 01/2012 s/p MDT DTBB1D1 Viva S CRT-D;  c. 12/2012 Echo: EF 45-50%, inf/post HK, nl RV, mild MR.  . Non-obstructive CAD    a. s/p multiple caths @ [facility] - last reportedly 06/2012;  b. 06/2013 Lexi MV: EF 51%, no ischemia/infarct.  . Presence of permanent cardiac pacemaker     Past Surgical History:  Procedure Laterality Date  . CARDIAC CATHETERIZATION  2013   [facility]   . CARDIAC CATHETERIZATION  2011  . CARDIAC DEFIBRILLATOR PLACEMENT    . COLONOSCOPY  32014  . COLONOSCOPY WITH PROPOFOL N/A 08/29/2014   Procedure: COLONOSCOPY WITH PROPOFOL;  Surgeon: Elnita Maxwell, MD;  Location: [facility] ENDOSCOPY;  Service: Endoscopy;  Laterality: N/A;  . Defib implant  01/27/2012   Serial # [id number] H Model # [id number]  . ESOPHAGOGASTRODUODENOSCOPY (EGD) WITH PROPOFOL N/A 08/29/2014   Procedure: ESOPHAGOGASTRODUODENOSCOPY (EGD) WITH PROPOFOL;  Surgeon: Elnita Maxwell, MD;  Location: [facility] ENDOSCOPY;  Service: Endoscopy;  Laterality: N/A;  . FETAL BLOOD TRANSFUSION  2014  . PACEMAKER INSERTION  01/27/2012  . UPPER GASTROINTESTINAL ENDOSCOPY  2014    Family History:  Family History  Problem Relation Age of Onset  . Family history unknown: Yes    Social History:  reports that he quit smoking about 12 years ago. His smoking use included Cigarettes. He has a 135.00 pack-year smoking history. He has never used smokeless tobacco. He reports that he does not drink alcohol or use drugs.  Additional Social  History:  Alcohol / Drug Use Pain Medications: SEE MAR Prescriptions: SEE MAR Over the Counter: SEE MAR History of alcohol / drug use?: No history of alcohol / drug abuse Longest period of sobriety (when/how long): n/a  CIWA: CIWA-Ar BP: (!) 158/78 Pulse Rate: (!) 58 COWS:    PATIENT STRENGTHS: (choose at least two) Active sense of humor Average or above average  intelligence  Allergies: No Known Allergies  Home Medications:  (Not in a hospital admission)  OB/GYN Status:  No LMP for male patient.  General Assessment Data Location of Assessment: [address] [facility] ED TTS Assessment: In system Is this a Tele or Face-to-Face Assessment?: Face-to-Face Is this an Initial Assessment or a Re-assessment for this encounter?: Initial Assessment Marital status: Married [place] name: n/a Is patient pregnant?: No Pregnancy Status: No Living Arrangements: Group Home (Pt states staya at [place]) Can pt return to current living arrangement?: Yes Admission Status: Voluntary Is patient capable of signing voluntary admission?: Yes Referral Source: Self/Family/Friend Insurance type: Medicare  Medical Screening Exam [facility] Walk-in ONLY) Medical Exam completed: Yes  Crisis Care Plan Living Arrangements: Group Home (Pt states staya at [place]) Legal Guardian: Other: (wife) Name of Psychiatrist: none Name of Therapist: none  Education Status Is patient currently in school?: No Current Grade: n/a Highest grade of school patient has completed: unknown Name of school: n/a Contact person: Wife  Risk to self with the past 6 months Suicidal Ideation: No Has patient been a risk to self within the past 6 months prior to admission? : No Suicidal Intent: No Has patient had any suicidal intent within the past 6 months prior to admission? : No Is patient at risk for suicide?: No Suicidal Plan?: No Has patient had any suicidal plan within the past 6 months prior to admission? : No Access to Means: No What has been your use of drugs/alcohol within the last 12 months?: denies Previous Attempts/Gestures: No How many times?: 0 Other Self Harm Risks: none noted Triggers for Past Attempts: None known Intentional Self Injurious Behavior: None Family Suicide History: No Recent stressful life event(s): Other (Comment) Persecutory voices/beliefs?: No Depression: No Substance abuse  history and/or treatment for substance abuse?: No  Risk to Others within the past 6 months Homicidal Ideation: No Does patient have any lifetime risk of violence toward others beyond the six months prior to admission? : No Thoughts of Harm to Others: No Current Homicidal Intent: No Current Homicidal Plan: No Access to Homicidal Means: No Identified Victim: none given History of harm to others?: No Assessment of Violence: None Noted Violent Behavior Description: no Does patient have access to weapons?: No Criminal Charges Pending?: No Does patient have a court date: No Is patient on probation?: No  Psychosis Hallucinations: None noted Delusions: None noted  Mental Status Report Appearance/Hygiene: In hospital gown Eye Contact: Fair Motor Activity: Freedom of movement Speech: Unremarkable Level of Consciousness: Alert Mood: Euthymic Affect: Anxious Anxiety Level: Moderate Thought Processes: Coherent, Relevant Judgement: Partial Orientation: Person, Place, Time, Situation, Appropriate for developmental age Obsessive Compulsive Thoughts/Behaviors: None  Cognitive Functioning Concentration: Decreased Memory: Recent Intact, Remote Intact IQ: Average Insight: Fair Impulse Control: Poor Appetite: Fair Weight Loss: 0 Weight Gain: 0 Sleep: Decreased Total Hours of Sleep: 4 Vegetative Symptoms: None  ADLScreening [facility] Assessment Services) Patient's cognitive ability adequate to safely complete daily activities?: Yes Patient able to express need for assistance with ADLs?: Yes Independently performs ADLs?: Yes (appropriate for developmental age) (some asst. needed dues to frequent falls)  Prior  Inpatient Therapy Prior Inpatient Therapy: No Prior Therapy Dates: n/a Prior Therapy Facilty/Provider(s): n/a Reason for Treatment: n/a  Prior Outpatient Therapy Prior Outpatient Therapy: No Prior Therapy Dates: n/a Prior Therapy Facilty/Provider(s): n/a Reason for Treatment:  n/a Does patient have an ACCT team?: No Does patient have Intensive In-House Services?  : No Does patient have [facility] services? : No Does patient have P4CC services?: No  ADL Screening (condition at time of admission) Patient's cognitive ability adequate to safely complete daily activities?: Yes Is the patient deaf or have difficulty hearing?: No Does the patient have difficulty seeing, even when wearing glasses/contacts?: No Does the patient have difficulty concentrating, remembering, or making decisions?: Yes (per MAR hx. of dimentia) Patient able to express need for assistance with ADLs?: Yes Does the patient have difficulty dressing or bathing?: Yes (some difficulty expressed) Independently performs ADLs?: Yes (appropriate for developmental age) (some asst. needed dues to frequent falls) Does the patient have difficulty walking or climbing stairs?: Yes (expresses difficulty) Weakness of Legs: Both Weakness of Arms/Hands: None  Home Assistive Devices/Equipment Home Assistive Devices/Equipment: None    Abuse/Neglect Assessment (Assessment to be complete while patient is alone) Physical Abuse: Denies Verbal Abuse: Denies Sexual Abuse: Denies Exploitation of patient/patient's resources: Denies Self-Neglect: Denies Values / Beliefs Cultural Requests During Hospitalization: None Spiritual Requests During Hospitalization: None   Advance Directives (For Healthcare) Does patient have an advance directive?: No Would patient like information on creating an advanced directive?: No - patient declined information Type of Advance Directive: Out of facility DNR (pink MOST or yellow form) Copy of advanced directive(s) in chart?: No - copy requested    Additional Information 1:1 In Past 12 Months?: No CIRT Risk: No Elopement Risk: No Does patient have medical clearance?: Yes     Disposition:  Disposition Initial Assessment Completed for this Encounter: Yes Disposition of Patient:  Other dispositions  Hipolito Bayley 09/10/2015 1:44 PM

## 2015-09-10 NOTE — ED Notes (Signed)
Ems on scene for transport to the [facility]

## 2015-09-15 ENCOUNTER — Emergency Department
Admission: EM | Admit: 2015-09-15 | Discharge: 2015-09-15 | Disposition: A | Discharge status: Home / Self Care | Payer: Medicare Other | Attending: Emergency Medicine | Admitting: Emergency Medicine

## 2015-09-15 ENCOUNTER — Emergency Department: Payer: Medicare Other

## 2015-09-15 DIAGNOSIS — I110 Hypertensive heart disease with heart failure: Secondary | ICD-10-CM | POA: Diagnosis not present

## 2015-09-15 DIAGNOSIS — Z950 Presence of cardiac pacemaker: Secondary | ICD-10-CM | POA: Insufficient documentation

## 2015-09-15 DIAGNOSIS — W1839XA Other fall on same level, initial encounter: Secondary | ICD-10-CM | POA: Insufficient documentation

## 2015-09-15 DIAGNOSIS — Z79899 Other long term (current) drug therapy: Secondary | ICD-10-CM | POA: Diagnosis not present

## 2015-09-15 DIAGNOSIS — I5042 Chronic combined systolic (congestive) and diastolic (congestive) heart failure: Secondary | ICD-10-CM | POA: Insufficient documentation

## 2015-09-15 DIAGNOSIS — Y92002 Bathroom of unspecified non-institutional (private) residence single-family (private) house as the place of occurrence of the external cause: Secondary | ICD-10-CM | POA: Insufficient documentation

## 2015-09-15 DIAGNOSIS — G8929 Other chronic pain: Secondary | ICD-10-CM | POA: Diagnosis not present

## 2015-09-15 DIAGNOSIS — E119 Type 2 diabetes mellitus without complications: Secondary | ICD-10-CM | POA: Diagnosis not present

## 2015-09-15 DIAGNOSIS — W19XXXA Unspecified fall, initial encounter: Secondary | ICD-10-CM

## 2015-09-15 DIAGNOSIS — R42 Dizziness and giddiness: Secondary | ICD-10-CM | POA: Diagnosis not present

## 2015-09-15 DIAGNOSIS — Z043 Encounter for examination and observation following other accident: Secondary | ICD-10-CM | POA: Diagnosis present

## 2015-09-15 DIAGNOSIS — Z87891 Personal history of nicotine dependence: Secondary | ICD-10-CM | POA: Insufficient documentation

## 2015-09-15 DIAGNOSIS — Y999 Unspecified external cause status: Secondary | ICD-10-CM | POA: Diagnosis not present

## 2015-09-15 DIAGNOSIS — Y939 Activity, unspecified: Secondary | ICD-10-CM | POA: Diagnosis not present

## 2015-09-15 DIAGNOSIS — Z7982 Long term (current) use of aspirin: Secondary | ICD-10-CM | POA: Insufficient documentation

## 2015-09-15 DIAGNOSIS — M545 Low back pain: Secondary | ICD-10-CM | POA: Diagnosis not present

## 2015-09-15 LAB — BASIC METABOLIC PANEL
ANION GAP: 9 (ref 5–15)
BUN: 22 mg/dL — AB (ref 6–20)
CHLORIDE: 99 mmol/L — AB (ref 101–111)
CO2: 27 mmol/L (ref 22–32)
Calcium: 9.2 mg/dL (ref 8.9–10.3)
Creatinine, Ser: 0.78 mg/dL (ref 0.61–1.24)
GFR calc Af Amer: >60 mL/min (ref >60)
Glucose, Bld: 110 mg/dL — ABNORMAL HIGH (ref 65–99)
POTASSIUM: 4.2 mmol/L (ref 3.5–5.1)
SODIUM: 135 mmol/L (ref 135–145)

## 2015-09-15 LAB — URINALYSIS COMPLETE WITH MICROSCOPIC (ARMC ONLY)
BACTERIA UA: NONE SEEN
Bilirubin Urine: NEGATIVE
GLUCOSE, UA: NEGATIVE mg/dL
HGB URINE DIPSTICK: NEGATIVE
Ketones, ur: NEGATIVE mg/dL
LEUKOCYTES UA: NEGATIVE
NITRITE: NEGATIVE
PH: 5 (ref 5.0–8.0)
PROTEIN: NEGATIVE mg/dL
SPECIFIC GRAVITY, URINE: 1.018 (ref 1.005–1.030)

## 2015-09-15 LAB — CBC
HEMATOCRIT: 46.5 % (ref 40.0–52.0)
HEMOGLOBIN: 15.2 g/dL (ref 13.0–18.0)
MCH: 26.9 pg (ref 26.0–34.0)
MCHC: 32.7 g/dL (ref 32.0–36.0)
MCV: 82.3 fL (ref 80.0–100.0)
Platelets: 227 10*3/uL (ref 150–440)
RBC: 5.65 MIL/uL (ref 4.40–5.90)
RDW: 17 % — ABNORMAL HIGH (ref 11.5–14.5)
WBC: 12.2 10*3/uL — AB (ref 3.8–10.6)

## 2015-09-15 LAB — DIGOXIN LEVEL: DIGOXIN LVL: 1.6 ng/mL (ref 0.8–2.0)

## 2015-09-15 MED ORDER — TRAMADOL HCL 50 MG PO TABS
50.0000 mg | ORAL_TABLET | ORAL | Status: AC
  Administered 2015-09-15: 50 mg via ORAL
  Filled 2015-09-15: qty 1

## 2015-09-15 MED ORDER — ACETAMINOPHEN 500 MG PO TABS
1000.0000 mg | ORAL_TABLET | ORAL | Status: AC
  Administered 2015-09-15: 1000 mg via ORAL
  Filled 2015-09-15: qty 2

## 2015-09-15 NOTE — ED Notes (Signed)
Patient transported to CT 

## 2015-09-15 NOTE — ED Triage Notes (Addendum)
Pt bib EMS w/ c/o falls r/t dizziness from the [facility].  Per EMS, pt fell twice in last hour, c/o pain all over.  Pt alert and oriented to self, time and situation. Movement/sensation intact w/ all limbs Resp even and unlabored. NAD

## 2015-09-15 NOTE — Discharge Instructions (Signed)

## 2015-09-15 NOTE — ED Provider Notes (Signed)
[facility] Emergency Department Provider Note   ____________________________________________   First MD Initiated Contact with Patient 09/15/15 1740     (approximate)  I have reviewed the triage vital signs and the nursing notes.   HISTORY  Chief Complaint Fall    HPI Nicolas Morris is a 82 y.o. male history of atrial fibrillation, anticoagulation, pacemaker, diabetes, and a recent lumbar compression fracture.  Patient reports that he fell in the bathroom. He had turned to the side, and was shuffling backwards when he lost his balance. He reports he fell striking the back of his head on the cabinet area. He denies any injury, and did not lose consciousness.  He reports that he's been having pain for about 2 months at its very achy especially in his lower back. He does have aching all over his body which is been ongoing for a long time as well, but also reports that he has moderate 3 out of 10 pain in his lower back daily now after a fracture.  Denies any numbness tingling or weakness. Denies any fevers or chills. No nausea or vomiting. No chest pain. No headache. No neck pain.  No change in his bowel or bladder habits.   Past Medical History:  Diagnosis Date  . A-fib (HCC)    a. Permanent, on Xarelto.  Marland Kitchen AICD (automatic cardioverter/defibrillator) present   . Biventricular ICD (implantable cardioverter-defibrillator) -Medtronic 01/12/2013   a. 01/2012 [facility]:  01/2012 s/p MDT DTBB1D1 Viva S CRT-D.  Atrial port capped (afib).  . Diabetes mellitus without complication (HCC)   . Diverticulosis    a. 12/2012 noted on colonoscopy.  . Gastritis    a. 12/2012 noted on EGD.  . H/O: GI bleed    a. 12/2012 EGD: HH, evidence of reflux. Erosive Gastritis. Nl duodenum;  b. 12/2012 Colonoscopy: Sev sigmoid and desc colon diverticulosis, transverse and ascending colon diverticulosis, nonbleeding internal hemorrhoids, no bleeding.  . Hiatal hernia    a.  12/2012 noted on EGD.  Marland Kitchen Hypertension   . Iron deficiency anemia   . NICM (nonischemic cardiomyopathy) (HCC)    a. EF reportedly < 30% 01/2013 ([facility]);  b. 01/2012 s/p MDT DTBB1D1 Viva S CRT-D;  c. 12/2012 Echo: EF 45-50%, inf/post HK, nl RV, mild MR.  . Non-obstructive CAD    a. s/p multiple caths @ [facility] - last reportedly 06/2012;  b. 06/2013 Lexi MV: EF 51%, no ischemia/infarct.  . Presence of permanent cardiac pacemaker     Patient Active Problem List   Diagnosis Date Noted  . Fall   . Frequent falls 08/22/2015  . Overdose 06/22/2015  . Dementia, senile with depression 06/22/2015  . Iron deficiency anemia 10/24/2014  . GIB (gastrointestinal bleeding) 08/30/2014  . Hyponatremia 08/30/2014  . Elevated troponin 08/30/2014  . External hemorrhoids 08/30/2014  . Diverticulosis of colon without hemorrhage 08/30/2014  . Gastritis and gastroduodenitis 08/30/2014  . Acute posthemorrhagic anemia 08/25/2014  . Unsteady gait 03/04/2014  . NICM (nonischemic cardiomyopathy) (HCC) 07/20/2013  . Diabetes mellitus without complication (HCC)   . Diverticulosis   . Hiatal hernia   . Non-obstructive CAD   . Neck pain 04/21/2013  . Depression 01/26/2013  . Atrial fibrillation-permanent 01/12/2013  . Biventricular ICD (implantable cardioverter-defibrillator) -Medtronic 01/12/2013  . Congestive dilated cardiomyopathy (HCC) 01/12/2013  . GI bleed 01/12/2013  . Chronic combined systolic and diastolic CHF (congestive heart failure) (HCC) 01/12/2013  . Biventricular ICD (implantable cardioverter-defibrillator) -Medtronic 01/12/2013    Past Surgical History:  Procedure Laterality Date  . CARDIAC CATHETERIZATION  2013   [facility]   . CARDIAC CATHETERIZATION  2011  . CARDIAC DEFIBRILLATOR PLACEMENT    . COLONOSCOPY  32014  . COLONOSCOPY WITH PROPOFOL N/A 08/29/2014   Procedure: COLONOSCOPY WITH PROPOFOL;  Surgeon: Elnita Maxwell, MD;  Location: [facility] ENDOSCOPY;  Service: Endoscopy;   Laterality: N/A;  . Defib implant  01/27/2012   Serial # [id number] H Model # [id number]  . ESOPHAGOGASTRODUODENOSCOPY (EGD) WITH PROPOFOL N/A 08/29/2014   Procedure: ESOPHAGOGASTRODUODENOSCOPY (EGD) WITH PROPOFOL;  Surgeon: Elnita Maxwell, MD;  Location: [facility] ENDOSCOPY;  Service: Endoscopy;  Laterality: N/A;  . FETAL BLOOD TRANSFUSION  2014  . PACEMAKER INSERTION  01/27/2012  . UPPER GASTROINTESTINAL ENDOSCOPY  2014    Prior to Admission medications   Medication Sig Start Date End Date Taking? Authorizing Provider  aspirin 81 MG chewable tablet Chew by mouth daily.    Historical Provider, MD  atorvastatin (LIPITOR) 80 MG tablet Take 80 mg by mouth daily.    Historical Provider, MD  buPROPion (WELLBUTRIN XL) 300 MG 24 hr tablet Take 300 mg by mouth daily.    Historical Provider, MD  carvedilol (COREG) 3.125 MG tablet Take 3.125 mg by mouth 2 (two) times daily.     Historical Provider, MD  digoxin (LANOXIN) 0.25 MG tablet Take 0.25 mg by mouth daily. 01/26/13   Duke Salvia, MD  finasteride (PROSCAR) 5 MG tablet Take 5 mg by mouth daily.    Historical Provider, MD  furosemide (LASIX) 20 MG tablet Take 20 mg by mouth daily.    Historical Provider, MD  gabapentin (NEURONTIN) 600 MG tablet Take 600 mg by mouth 3 (three) times daily.    Historical Provider, MD  lisinopril (PRINIVIL,ZESTRIL) 2.5 MG tablet Take 2.5 mg by mouth daily.    Historical Provider, MD  Melatonin 1 MG TABS Take 1 mg by mouth at bedtime.    Historical Provider, MD  pantoprazole (PROTONIX) 40 MG tablet Take 40 mg by mouth daily.     Historical Provider, MD  potassium chloride SA (K-DUR,KLOR-CON) 20 MEQ tablet Take 20 mEq by mouth daily.  02/17/15   Historical Provider, MD  sertraline (ZOLOFT) 100 MG tablet Take 100 mg by mouth daily.    Historical Provider, MD  tamsulosin (FLOMAX) 0.4 MG CAPS capsule Take 0.4 mg by mouth daily. Take 30 minutes after a meal    Historical Provider, MD  traMADol (ULTRAM) 50 MG tablet Take 1 tablet  (50 mg total) by mouth every 6 (six) hours as needed. 09/09/15 09/08/16  Phineas Semen, MD    Allergies Review of patient's allergies indicates no known allergies.  Family History  Problem Relation Age of Onset  . Family history unknown: Yes    Social History Social History  Substance Use Topics  . Smoking status: Former Smoker    Packs/day: 3.00    Years: 45.00    Types: Cigarettes    Quit date: 01/13/2003  . Smokeless tobacco: Never Used  . Alcohol use No    Review of Systems Constitutional: No fever/chills Eyes: No visual changes. ENT: No sore throat. Cardiovascular: Denies chest pain. Respiratory: Denies shortness of breath. Gastrointestinal: No abdominal pain.  No nausea, no vomiting.  No diarrhea.  No constipation. Genitourinary: Negative for dysuria. Musculoskeletal: She history of present illness, no change in his chronic pain in his lower back Skin: Negative for rash. Neurological: Negative for headaches, focal weakness or numbness.  10-point ROS otherwise negative.  ____________________________________________  PHYSICAL EXAM:  VITAL SIGNS: ED Triage Vitals  Enc Vitals Group     BP 09/15/15 1729 110/62     Pulse Rate 09/15/15 1729 75     Resp 09/15/15 1729 19     Temp 09/15/15 1729 98.7 F (37.1 C)     Temp Source 09/15/15 1729 Oral     SpO2 09/15/15 1729 95 %     Weight 09/15/15 1729 205 lb 8 oz (93.2 kg)     Height --      Head Circumference --      Peak Flow --      Pain Score 09/15/15 1730 3     Pain Loc --      Pain Edu? --      Excl. in GC? --     Constitutional: Alert and oriented. Well appearing and in no acute distress.Patient's chart states he has dementia, but he is alert and oriented to year, place and name at this time. He gives a very coherent history. Eyes: Conjunctivae are normal. PERRL. EOMI. Head: Atraumatic. Nose: No congestion/rhinnorhea. Mouth/Throat: Mucous membranes are moist.  Oropharynx non-erythematous. Neck: No  stridor.  No midline cervical tenderness. Cardiovascular: Normal rate, regular rhythm. Grossly normal heart sounds.  Good peripheral circulation. Respiratory: Normal respiratory effort.  No retractions. Lungs CTAB. Gastrointestinal: Soft and nontender. No distention.  Lower Extremities  No edema. Normal DP/PT pulses bilateral with good cap refill.  Normal neuro-motor function lower extremities bilateral.  RIGHT Right lower extremity demonstrates normal strength, good use of all muscles. No edema bruising or contusions of the right hip, right knee, right ankle. Full range of motion of the right lower extremity without pain except he reports increased discomfort in his lower back with raising the leg. No pain on axial loading. No evidence of trauma.  LEFT Left lower extremity demonstrates normal strength, good use of all muscles. No edema bruising or contusions of the hip,  knee, ankle. Full range of motion of the left lower extremity without pain except he reports moderate increase in discomfort in his lower back with raising the leg. No pain on axial loading. No evidence of trauma.  RIGHT Right upper extremity demonstrates normal strength, good use of all muscles. No edema bruising or contusions of the right shoulder/upper arm, right elbow, right forearm / hand. Full range of motion of the right right upper extremity without pain. No evidence of trauma. Strong radial pulse. Intact median/ulnar/radial neuro-muscular exam.  LEFT Left upper extremity demonstrates normal strength, good use of all muscles. No edema bruising or contusions of the left shoulder/upper arm, left elbow, left forearm / hand. Full range of motion of the left  upper extremity without pain. No evidence of trauma. Strong radial pulse. Intact median/ulnar/radial neuro-muscular exam.  Neurologic:  Normal speech and language. No gross focal neurologic deficits are appreciated.  Skin:  Skin is warm, dry and intact. No rash  noted. Psychiatric: Mood and affect are calm. Speech and behavior are normal.  ____________________________________________   LABS (all labs ordered are listed, but only abnormal results are displayed)  Labs Reviewed  BASIC METABOLIC PANEL - Abnormal; Notable for the following:       Result Value   Chloride 99 (*)    Glucose, Bld 110 (*)    BUN 22 (*)    All other components within normal limits  CBC  URINALYSIS COMPLETEWITH MICROSCOPIC ([facility] ONLY)  DIGOXIN LEVEL  CBG MONITORING, ED   ____________________________________________  EKG  Reviewed and interpreted  by me at 1730 Heart rate 75 QRS 1:30 QTc 460 Underlying rhythm atrial fibrillation, ventricular pacing noted with normal capture. Associated with expected left bundle-branch block. ____________________________________________  RADIOLOGY  Ct Head Wo Contrast  Result Date: 09/15/2015 CLINICAL DATA:  Pt bib EMS w/ c/o falls r/t dizziness from the [place]. Per EMS, pt fell twice in last hour, c/o pain all over. Pt alert and oriented to self, time and situation. Resp even and unlabored. NAD. TKV EXAM: CT HEAD WITHOUT CONTRAST CT CERVICAL SPINE WITHOUT CONTRAST TECHNIQUE: Multidetector CT imaging of the head and cervical spine was performed following the standard protocol without intravenous contrast. Multiplanar CT image reconstructions of the cervical spine were also generated. COMPARISON:  09/08/2015. FINDINGS: CT HEAD FINDINGS The ventricles are normal configuration. There is ventricular sulcal enlargement reflecting mild atrophy, stable. There are no parenchymal masses or mass effect. Small focus of hypoattenuation in the left basal ganglia is stable likely an old lacune infarct. There is no evidence of a recent infarct. Mild patchy white matter hypoattenuation is noted consistent with chronic microvascular ischemic change, also stable. There are no extra-axial masses or abnormal fluid collections. There is no intracranial  hemorrhage. There is partial chronic opacification of the left maxillary sinus. Remaining visualized sinuses and mastoid air cells are clear. No skull fracture. CT CERVICAL SPINE FINDINGS No fracture.  No spondylolisthesis. Stable degenerative changes are noted throughout the cervical spine. No soft tissue masses or adenopathy. Probable small left thyroid nodules, stable. Lung apices are clear. IMPRESSION: HEAD CT: No acute intracranial abnormalities. No change from the recent prior head CT. CERVICAL CT: No fracture or acute finding. No change from the recent prior CT. Electronically Signed   By: Amie Portland M.D.   On: 09/15/2015 18:34  Ct Cervical Spine Wo Contrast  Result Date: 09/15/2015 CLINICAL DATA:  Pt bib EMS w/ c/o falls r/t dizziness from the [place]. Per EMS, pt fell twice in last hour, c/o pain all over. Pt alert and oriented to self, time and situation. Resp even and unlabored. NAD. TKV EXAM: CT HEAD WITHOUT CONTRAST CT CERVICAL SPINE WITHOUT CONTRAST TECHNIQUE: Multidetector CT imaging of the head and cervical spine was performed following the standard protocol without intravenous contrast. Multiplanar CT image reconstructions of the cervical spine were also generated. COMPARISON:  09/08/2015. FINDINGS: CT HEAD FINDINGS The ventricles are normal configuration. There is ventricular sulcal enlargement reflecting mild atrophy, stable. There are no parenchymal masses or mass effect. Small focus of hypoattenuation in the left basal ganglia is stable likely an old lacune infarct. There is no evidence of a recent infarct. Mild patchy white matter hypoattenuation is noted consistent with chronic microvascular ischemic change, also stable. There are no extra-axial masses or abnormal fluid collections. There is no intracranial hemorrhage. There is partial chronic opacification of the left maxillary sinus. Remaining visualized sinuses and mastoid air cells are clear. No skull fracture. CT CERVICAL SPINE  FINDINGS No fracture.  No spondylolisthesis. Stable degenerative changes are noted throughout the cervical spine. No soft tissue masses or adenopathy. Probable small left thyroid nodules, stable. Lung apices are clear. IMPRESSION: HEAD CT: No acute intracranial abnormalities. No change from the recent prior head CT. CERVICAL CT: No fracture or acute finding. No change from the recent prior CT. Electronically Signed   By: Amie Portland M.D.   On: 09/15/2015 18:34   ____________________________________________   PROCEDURES  Procedure(s) performed: None  Procedures  Critical Care performed: No  ____________________________________________   INITIAL IMPRESSION / ASSESSMENT  AND PLAN / ED COURSE  Pertinent labs & imaging results that were available during my care of the patient were reviewed by me and considered in my medical decision making (see chart for details).  Patient reports a fall, appears he likely lost his balance. He is neurologically intact. He does have pain in his lower back but reports this is consistent and unchanged from previous and known compression fracture. No evidence of acute neurologic compromise or cauda equina. No evidence of injury to the extremities, and his head and neck appear atraumatic food does report striking it. As he is reportedly on Xarelto, we will obtain CT imaging to exclude intracranial injury. Basic labs, and continue to observe. I expect this is likely a mechanical fall, but we will evaluate with lab testing given the patient's noted history of dementia and a history of frequent falls.  Clinical Course  Value Comment By Time  CT Cervical Spine Wo Contrast (Reviewed) Sharyn Creamer, MD 07/28 2137  CT Head Wo Contrast (Reviewed) Sharyn Creamer, MD 07/28 2137    ----------------------------------------- 9:38 PM on 09/15/2015 -----------------------------------------  Patient resting comfortably. Based on my evaluation, I find no evidence of new acute  injury after his fall. Appears most consistent with mechanical fall. Plan to return the patient back to his care facility at this time. Return precautions and treatment recommendations and follow-up discussed with the patient who is agreeable with the plan.  ____________________________________________   FINAL CLINICAL IMPRESSION(S) / ED DIAGNOSES  Final diagnoses:  None  fall, initial Low back pain, chronic and unchanged Poor balanace    NEW MEDICATIONS STARTED DURING THIS VISIT:  New Prescriptions   No medications on file     Note:  This document was prepared using Dragon voice recognition software and may include unintentional dictation errors.     Sharyn Creamer, MD 09/15/15 2139

## 2015-09-27 ENCOUNTER — Emergency Department: Payer: Medicare Other

## 2015-09-27 ENCOUNTER — Emergency Department
Admission: EM | Admit: 2015-09-27 | Discharge: 2015-09-28 | Disposition: A | Discharge status: Home / Self Care | Payer: Medicare Other | Attending: Student in an Organized Health Care Education/Training Program | Admitting: Student in an Organized Health Care Education/Training Program

## 2015-09-27 DIAGNOSIS — S3992XD Unspecified injury of lower back, subsequent encounter: Secondary | ICD-10-CM | POA: Diagnosis present

## 2015-09-27 DIAGNOSIS — W19XXXD Unspecified fall, subsequent encounter: Secondary | ICD-10-CM | POA: Diagnosis not present

## 2015-09-27 DIAGNOSIS — S32000G Wedge compression fracture of unspecified lumbar vertebra, subsequent encounter for fracture with delayed healing: Secondary | ICD-10-CM

## 2015-09-27 DIAGNOSIS — Z950 Presence of cardiac pacemaker: Secondary | ICD-10-CM | POA: Insufficient documentation

## 2015-09-27 DIAGNOSIS — Y92129 Unspecified place in nursing home as the place of occurrence of the external cause: Secondary | ICD-10-CM | POA: Diagnosis not present

## 2015-09-27 DIAGNOSIS — I2510 Atherosclerotic heart disease of native coronary artery without angina pectoris: Secondary | ICD-10-CM | POA: Diagnosis not present

## 2015-09-27 DIAGNOSIS — S32010G Wedge compression fracture of first lumbar vertebra, subsequent encounter for fracture with delayed healing: Secondary | ICD-10-CM | POA: Diagnosis not present

## 2015-09-27 DIAGNOSIS — Z79899 Other long term (current) drug therapy: Secondary | ICD-10-CM | POA: Insufficient documentation

## 2015-09-27 DIAGNOSIS — E119 Type 2 diabetes mellitus without complications: Secondary | ICD-10-CM | POA: Insufficient documentation

## 2015-09-27 DIAGNOSIS — Y999 Unspecified external cause status: Secondary | ICD-10-CM | POA: Insufficient documentation

## 2015-09-27 DIAGNOSIS — I10 Essential (primary) hypertension: Secondary | ICD-10-CM | POA: Diagnosis not present

## 2015-09-27 DIAGNOSIS — Y939 Activity, unspecified: Secondary | ICD-10-CM | POA: Diagnosis not present

## 2015-09-27 DIAGNOSIS — S0990XD Unspecified injury of head, subsequent encounter: Secondary | ICD-10-CM | POA: Diagnosis not present

## 2015-09-27 DIAGNOSIS — Z87891 Personal history of nicotine dependence: Secondary | ICD-10-CM | POA: Insufficient documentation

## 2015-09-27 DIAGNOSIS — W19XXXA Unspecified fall, initial encounter: Secondary | ICD-10-CM

## 2015-09-27 DIAGNOSIS — Z7982 Long term (current) use of aspirin: Secondary | ICD-10-CM | POA: Diagnosis not present

## 2015-09-27 LAB — COMPREHENSIVE METABOLIC PANEL
ALBUMIN: 3.6 g/dL (ref 3.5–5.0)
ALT: 30 U/L (ref 17–63)
AST: 40 U/L (ref 15–41)
Alkaline Phosphatase: 156 U/L — ABNORMAL HIGH (ref 38–126)
Anion gap: 12 (ref 5–15)
BUN: 27 mg/dL — AB (ref 6–20)
CHLORIDE: 104 mmol/L (ref 101–111)
CO2: 22 mmol/L (ref 22–32)
CREATININE: 0.92 mg/dL (ref 0.61–1.24)
Calcium: 9.3 mg/dL (ref 8.9–10.3)
GFR calc Af Amer: >60 mL/min (ref >60)
GLUCOSE: 117 mg/dL — AB (ref 65–99)
Potassium: 4.3 mmol/L (ref 3.5–5.1)
Sodium: 138 mmol/L (ref 135–145)
Total Bilirubin: 1.2 mg/dL (ref 0.3–1.2)
Total Protein: 7.2 g/dL (ref 6.5–8.1)

## 2015-09-27 LAB — CBC
HCT: 44.9 % (ref 40.0–52.0)
Hemoglobin: 15.1 g/dL (ref 13.0–18.0)
MCH: 27.2 pg (ref 26.0–34.0)
MCHC: 33.7 g/dL (ref 32.0–36.0)
MCV: 80.7 fL (ref 80.0–100.0)
PLATELETS: 225 10*3/uL (ref 150–440)
RBC: 5.56 MIL/uL (ref 4.40–5.90)
RDW: 16.9 % — ABNORMAL HIGH (ref 11.5–14.5)
WBC: 8.7 10*3/uL (ref 3.8–10.6)

## 2015-09-27 NOTE — ED Notes (Signed)
Called lab to add on troponin and digoxin level - the verbalized that they had enough blood

## 2015-09-27 NOTE — ED Notes (Signed)
Pt undressed and took all monitoring equipment off - repositioned/redressed/and monitoring equipment hooked back up - pt is talking about seeing "cement trucks" in the er - reassured pt - reoriented pt - turned lights down to hopefully allow pt to rest

## 2015-09-27 NOTE — ED Notes (Signed)
Pt undressed and pulled all monitoring equipment off again - will leave equipment off at this time - pt redressed and covered back up

## 2015-09-27 NOTE — ED Notes (Signed)
Pt arrived via ems from the [place] - c/o fall unwitnessed - has pain in neck and lumbar region upon palpation - has full sensation in bilat ext and is able to raise and hold bilat legs up off bed on command

## 2015-09-27 NOTE — ED Triage Notes (Signed)
Pt arrived via ems from the [place] - c/o fall unwitnessed - has pain in neck and lumbar region upon palpation - has full sensation in bilat ext and is able to raise and hol bilat legs on command

## 2015-09-27 NOTE — ED Provider Notes (Signed)
[facility] Emergency Department Provider Note    First MD Initiated Contact with Patient 09/27/15 2122     (approximate)  I have reviewed the triage vital signs and the nursing notes.   HISTORY  Chief Complaint Fall    HPI Nicolas Morris is a 82 y.o. male History of Aa. Fib as well as a pacemaker on anticoagulation presenting from a nursing home status post unwitnessed fall complaining of back pain. Patient was recently seen for similar events this past week. Patient had evidence of lumbar compression fracture.  n arrival to the ED patient is complaining of low back pain status post his fall. States that he did hit his head but does not remember log consciousness. He denies any neck pain or chest pain. No abdominal pain. No recent fevers. Denies any shortness of breath.  Past Medical History:  Diagnosis Date  . A-fib (HCC)    a. Permanent, on Xarelto.  Marland Kitchen AICD (automatic cardioverter/defibrillator) present   . Biventricular ICD (implantable cardioverter-defibrillator) -Medtronic 01/12/2013   a. 01/2012 [facility]:  01/2012 s/p MDT DTBB1D1 Viva S CRT-D.  Atrial port capped (afib).  . Diabetes mellitus without complication (HCC)   . Diverticulosis    a. 12/2012 noted on colonoscopy.  . Gastritis    a. 12/2012 noted on EGD.  . H/O: GI bleed    a. 12/2012 EGD: HH, evidence of reflux. Erosive Gastritis. Nl duodenum;  b. 12/2012 Colonoscopy: Sev sigmoid and desc colon diverticulosis, transverse and ascending colon diverticulosis, nonbleeding internal hemorrhoids, no bleeding.  . Hiatal hernia    a. 12/2012 noted on EGD.  Marland Kitchen Hypertension   . Iron deficiency anemia   . NICM (nonischemic cardiomyopathy) (HCC)    a. EF reportedly < 30% 01/2013 ([facility]);  b. 01/2012 s/p MDT DTBB1D1 Viva S CRT-D;  c. 12/2012 Echo: EF 45-50%, inf/post HK, nl RV, mild MR.  . Non-obstructive CAD    a. s/p multiple caths @ [facility] - last reportedly 06/2012;  b. 06/2013 Lexi MV: EF 51%,  no ischemia/infarct.  . Presence of permanent cardiac pacemaker     Patient Active Problem List   Diagnosis Date Noted  . Fall   . Frequent falls 08/22/2015  . Overdose 06/22/2015  . Dementia, senile with depression 06/22/2015  . Iron deficiency anemia 10/24/2014  . GIB (gastrointestinal bleeding) 08/30/2014  . Hyponatremia 08/30/2014  . Elevated troponin 08/30/2014  . External hemorrhoids 08/30/2014  . Diverticulosis of colon without hemorrhage 08/30/2014  . Gastritis and gastroduodenitis 08/30/2014  . Acute posthemorrhagic anemia 08/25/2014  . Unsteady gait 03/04/2014  . NICM (nonischemic cardiomyopathy) (HCC) 07/20/2013  . Diabetes mellitus without complication (HCC)   . Diverticulosis   . Hiatal hernia   . Non-obstructive CAD   . Neck pain 04/21/2013  . Depression 01/26/2013  . Atrial fibrillation-permanent 01/12/2013  . Biventricular ICD (implantable cardioverter-defibrillator) -Medtronic 01/12/2013  . Congestive dilated cardiomyopathy (HCC) 01/12/2013  . GI bleed 01/12/2013  . Chronic combined systolic and diastolic CHF (congestive heart failure) (HCC) 01/12/2013  . Biventricular ICD (implantable cardioverter-defibrillator) -Medtronic 01/12/2013    Past Surgical History:  Procedure Laterality Date  . CARDIAC CATHETERIZATION  2013   [facility]   . CARDIAC CATHETERIZATION  2011  . CARDIAC DEFIBRILLATOR PLACEMENT    . COLONOSCOPY  32014  . COLONOSCOPY WITH PROPOFOL N/A 08/29/2014   Procedure: COLONOSCOPY WITH PROPOFOL;  Surgeon: Elnita Maxwell, MD;  Location: [facility] ENDOSCOPY;  Service: Endoscopy;  Laterality: N/A;  . Defib implant  01/27/2012   Serial # [id number] H Model # [id number]  . ESOPHAGOGASTRODUODENOSCOPY (EGD) WITH PROPOFOL N/A 08/29/2014   Procedure: ESOPHAGOGASTRODUODENOSCOPY (EGD) WITH PROPOFOL;  Surgeon: Elnita Maxwell, MD;  Location: [facility] ENDOSCOPY;  Service: Endoscopy;  Laterality: N/A;  . FETAL BLOOD TRANSFUSION  2014  . PACEMAKER INSERTION  01/27/2012    . UPPER GASTROINTESTINAL ENDOSCOPY  2014    Prior to Admission medications   Medication Sig Start Date End Date Taking? Authorizing Provider  aspirin 81 MG chewable tablet Chew by mouth daily.    Historical Provider, MD  atorvastatin (LIPITOR) 80 MG tablet Take 80 mg by mouth daily.    Historical Provider, MD  buPROPion (WELLBUTRIN XL) 300 MG 24 hr tablet Take 300 mg by mouth daily.    Historical Provider, MD  carvedilol (COREG) 3.125 MG tablet Take 3.125 mg by mouth 2 (two) times daily.     Historical Provider, MD  digoxin (LANOXIN) 0.25 MG tablet Take 0.25 mg by mouth daily. 01/26/13   Duke Salvia, MD  finasteride (PROSCAR) 5 MG tablet Take 5 mg by mouth daily.    Historical Provider, MD  furosemide (LASIX) 20 MG tablet Take 20 mg by mouth daily.    Historical Provider, MD  gabapentin (NEURONTIN) 600 MG tablet Take 600 mg by mouth 3 (three) times daily.    Historical Provider, MD  lisinopril (PRINIVIL,ZESTRIL) 2.5 MG tablet Take 2.5 mg by mouth daily.    Historical Provider, MD  Melatonin 1 MG TABS Take 1 mg by mouth at bedtime.    Historical Provider, MD  pantoprazole (PROTONIX) 40 MG tablet Take 40 mg by mouth daily.     Historical Provider, MD  potassium chloride SA (K-DUR,KLOR-CON) 20 MEQ tablet Take 20 mEq by mouth daily.  02/17/15   Historical Provider, MD  sertraline (ZOLOFT) 100 MG tablet Take 100 mg by mouth daily.    Historical Provider, MD  tamsulosin (FLOMAX) 0.4 MG CAPS capsule Take 0.4 mg by mouth daily. Take 30 minutes after a meal    Historical Provider, MD  traMADol (ULTRAM) 50 MG tablet Take 1 tablet (50 mg total) by mouth every 6 (six) hours as needed. 09/09/15 09/08/16  Phineas Semen, MD    Allergies Review of patient's allergies indicates no known allergies.  Family History  Problem Relation Age of Onset  . Family history unknown: Yes    Social History Social History  Substance Use Topics  . Smoking status: Former Smoker    Packs/day: 3.00    Years: 45.00     Types: Cigarettes    Quit date: 01/13/2003  . Smokeless tobacco: Never Used  . Alcohol use No    Review of Systems Patient denies headaches, rhinorrhea, blurry vision, numbness, shortness of breath, chest pain, edema, cough, abdominal pain, nausea, vomiting, diarrhea, dysuria, fevers, rashes or hallucinations unless otherwise stated above in HPI. ____________________________________________   PHYSICAL EXAM:  VITAL SIGNS: Vitals:   09/27/15 2120  BP: (!) 167/146  Pulse: 76  Resp: 16  Temp: 98.4 F (36.9 C)    Constitutional: Alert and oriented. Chronically ill-appearing Eyes: Conjunctivae are normal. PERRL. EOMI. Head: Atraumatic. Nose: No congestion/rhinnorhea. Mouth/Throat: Mucous membranes are moist.  Oropharynx non-erythematous. Neck: No stridor. Painless ROM. No cervical spine tenderness to palpation Hematological/Lymphatic/Immunilogical: No cervical lymphadenopathy. Cardiovascular: Normal rate, regular rhythm. Grossly normal heart sounds.  Good peripheral circulation. Respiratory: Normal respiratory effort.  No retractions. Lungs CTAB. Gastrointestinal: Soft and nontender. No distention. No abdominal bruits. No CVA tenderness. Musculoskeletal: No lower  extremity tenderness nor edema.  Moving bilateral lower extremities spontaneously. Neurologic:  Slurred speech was reportedly baseline. No facial droop. 5 strength in bilateral lower extremities. Down going Bab[place]kis. No clonus.. Skin:  Skin is warm, dry and intact. No rash noted.  ____________________________________________   LABS (all labs ordered are listed, but only abnormal results are displayed)  Results for orders placed or performed during the hospital encounter of 09/27/15 (from the past 24 hour(s))  CBC     Status: Abnormal   Collection Time: 09/27/15 10:25 PM  Result Value Ref Range   WBC 8.7 3.8 - 10.6 K/uL   RBC 5.56 4.40 - 5.90 MIL/uL   Hemoglobin 15.1 13.0 - 18.0 g/dL   HCT 64.8 47.2 - 07.2 %    MCV 80.7 80.0 - 100.0 fL   MCH 27.2 26.0 - 34.0 pg   MCHC 33.7 32.0 - 36.0 g/dL   RDW 18.2 (H) 88.3 - 37.4 %   Platelets 225 150 - 440 K/uL  Comprehensive metabolic panel     Status: Abnormal   Collection Time: 09/27/15 10:25 PM  Result Value Ref Range   Sodium 138 135 - 145 mmol/L   Potassium 4.3 3.5 - 5.1 mmol/L   Chloride 104 101 - 111 mmol/L   CO2 22 22 - 32 mmol/L   Glucose, Bld 117 (H) 65 - 99 mg/dL   BUN 27 (H) 6 - 20 mg/dL   Creatinine, Ser 4.51 0.61 - 1.24 mg/dL   Calcium 9.3 8.9 - 46.0 mg/dL   Total Protein 7.2 6.5 - 8.1 g/dL   Albumin 3.6 3.5 - 5.0 g/dL   AST 40 15 - 41 U/L   ALT 30 17 - 63 U/L   Alkaline Phosphatase 156 (H) 38 - 126 U/L   Total Bilirubin 1.2 0.3 - 1.2 mg/dL   GFR calc non Af Amer >60 >60 mL/min   GFR calc Af Amer >60 >60 mL/min   Anion gap 12 5 - 15   ____________________________________________  EKG  Time: 21:25  Indication: fall  Rate: 75  Rhythm: paced rhythm Axis: left Other: no sgarbossa criteria ____________________________________________  RADIOLOGY  CT head with NAICA CXR my read shows no evidence of acute cardiopulmonary process. XR L spine and pelvis with evidence of L1 compression fracture which may have interval worsening but minimally so. ____________________________________________   PROCEDURES  Procedure(s) performed: none    Critical Care performed: no ____________________________________________   INITIAL IMPRESSION / ASSESSMENT AND PLAN / ED COURSE  Pertinent labs & imaging results that were available during my care of the patient were reviewed by me and considered in my medical decision making (see chart for details).  DDX: fracture, contusion, muscle spasm, electrolyte abnormality, failure to thrive  Nicolas Morris is a 82 y.o. who presents to the ED with complaint of low back pain status post unwitnessed fall abdominal exam soft and benign. No cervical spine tears to palpation. As patient is on anticoagulation  with list fall will order CT head to evaluate for acute traumatic injury. Will order an x-ray imaging of the chest lumba and pelvis to evaluate for acute bony injury. Will check labs as he is pone to dehydration at increased risk for electrolyte abnormality.  Clinical Course  ----------------------------------------- 12:15 AM on 09/28/2015 -----------------------------------------  Imaging reviewed shows no acute intracranial normality. Patient with previously noted T12 and L1 compression fractures with possible worsening of L1 compression fracture. Neuro exam remains nonfocal. Patient does not have acute leukocytosis or fever. Do not  suspect acute infectious process. No electrolyte abnormality noted. EKG is unchanged from previous. At this point given his age we are still awaiting results of troponin and his digoxin level was this was an unwitnessed fall. Patient will be signed out to oncoming provider Dr. Dolores Frame pending results of troponin and digoxin.   I anticipate discharge back to facility.  Patient will need to follow up with orthopedics and maintain TLSO bracing for comfort. He remains hemodynamically stable at this time.  Have discussed with the patient and available family all diagnostics and treatments performed thus far and all questions were answered to the best of my ability. The patient demonstrates understanding and agreement with plan.    ____________________________________________   FINAL CLINICAL IMPRESSION(S) / ED DIAGNOSES  Final diagnoses:  Fall, initial encounter  Compression fracture of lumbar vertebra, with delayed healing, subsequent encounter     NEW MEDICATIONS STARTED DURING THIS VISIT:  New Prescriptions   No medications on file     Note:  This document was prepared using Dragon voice recognition software and may include unintentional dictation errors.    Willy Eddy, MD 09/28/15 [phone]

## 2015-09-27 NOTE — ED Notes (Signed)
Pt in xray will attempt blood draw when he returns due to unable to obtain blood from IV

## 2015-09-28 ENCOUNTER — Emergency Department
Admission: EM | Admit: 2015-09-28 | Discharge: 2015-09-28 | Disposition: A | Discharge status: Home / Self Care | Payer: Medicare Other | Source: Home / Self Care | Attending: Emergency Medicine | Admitting: Emergency Medicine

## 2015-09-28 ENCOUNTER — Emergency Department: Payer: Medicare Other

## 2015-09-28 DIAGNOSIS — S32010D Wedge compression fracture of first lumbar vertebra, subsequent encounter for fracture with routine healing: Secondary | ICD-10-CM

## 2015-09-28 DIAGNOSIS — W1839XD Other fall on same level, subsequent encounter: Secondary | ICD-10-CM

## 2015-09-28 DIAGNOSIS — Z87891 Personal history of nicotine dependence: Secondary | ICD-10-CM

## 2015-09-28 DIAGNOSIS — I110 Hypertensive heart disease with heart failure: Secondary | ICD-10-CM | POA: Insufficient documentation

## 2015-09-28 DIAGNOSIS — I5042 Chronic combined systolic (congestive) and diastolic (congestive) heart failure: Secondary | ICD-10-CM | POA: Insufficient documentation

## 2015-09-28 DIAGNOSIS — E119 Type 2 diabetes mellitus without complications: Secondary | ICD-10-CM | POA: Insufficient documentation

## 2015-09-28 DIAGNOSIS — S32010G Wedge compression fracture of first lumbar vertebra, subsequent encounter for fracture with delayed healing: Secondary | ICD-10-CM | POA: Diagnosis not present

## 2015-09-28 DIAGNOSIS — Z7982 Long term (current) use of aspirin: Secondary | ICD-10-CM | POA: Insufficient documentation

## 2015-09-28 DIAGNOSIS — Z950 Presence of cardiac pacemaker: Secondary | ICD-10-CM | POA: Insufficient documentation

## 2015-09-28 LAB — GLUCOSE, CAPILLARY: GLUCOSE-CAPILLARY: 101 mg/dL — AB (ref 65–99)

## 2015-09-28 LAB — DIGOXIN LEVEL: Digoxin Level: 1.4 ng/mL (ref 0.8–2.0)

## 2015-09-28 LAB — TROPONIN I
TROPONIN I: 0.03 ng/mL — AB (ref <0.03)
Troponin I: 0.03 ng/mL (ref <0.03)

## 2015-09-28 MED ORDER — HYDROCODONE-ACETAMINOPHEN 5-325 MG PO TABS
1.0000 | ORAL_TABLET | Freq: Once | ORAL | Status: AC
  Administered 2015-09-28: 1 via ORAL
  Filled 2015-09-28: qty 1

## 2015-09-28 MED ORDER — LIDOCAINE 5 % EX PTCH
1.0000 | MEDICATED_PATCH | CUTANEOUS | Status: DC
  Administered 2015-09-28: 1 via TRANSDERMAL

## 2015-09-28 MED ORDER — HYDROCODONE-ACETAMINOPHEN 5-325 MG PO TABS: 1.0000 | ORAL_TABLET | Freq: Four times a day (QID) | ORAL | 0 refills | Status: AC | PRN

## 2015-09-28 MED ORDER — LIDOCAINE 5 % EX PTCH: 1.0000 | MEDICATED_PATCH | CUTANEOUS | 0 refills | Status: DC

## 2015-09-28 NOTE — ED Provider Notes (Addendum)
Time Seen: Approximately 1:03 PM I have reviewed the triage notes  Chief Complaint: Near Syncope; Fall; and Neck Pain   History of Present Illness: Nicolas Morris is a 82 y.o. male who was transported here again by EMS after he had a near syncopal episode and a fall today. Patient apparently was sitting on the toilet when he became hypotensive and fell. Patient was just seen here yesterday after a fall and had an extensive laboratory work including a CBC, digoxin level, comprehensive panel etc. Head CT was negative. There is no evidence of a new head trauma though the patient complains of neck pain and some lower midline  back pain. Patient has a history of dementia and is somewhat limited historian. The patient had lumbar spine films performed yesterday which did not show any acute fracture. Patient was noted yesterday on plain films to have a T12 and L1 compression injuries.      Past Medical History:  Diagnosis Date  . A-fib (HCC)    a. Permanent, on Xarelto.  Marland Kitchen AICD (automatic cardioverter/defibrillator) present   . Biventricular ICD (implantable cardioverter-defibrillator) -Medtronic 01/12/2013   a. 01/2012 [facility]:  01/2012 s/p MDT DTBB1D1 Viva S CRT-D.  Atrial port capped (afib).  . Diabetes mellitus without complication (HCC)   . Diverticulosis    a. 12/2012 noted on colonoscopy.  . Gastritis    a. 12/2012 noted on EGD.  . H/O: GI bleed    a. 12/2012 EGD: HH, evidence of reflux. Erosive Gastritis. Nl duodenum;  b. 12/2012 Colonoscopy: Sev sigmoid and desc colon diverticulosis, transverse and ascending colon diverticulosis, nonbleeding internal hemorrhoids, no bleeding.  . Hiatal hernia    a. 12/2012 noted on EGD.  Marland Kitchen Hypertension   . Iron deficiency anemia   . NICM (nonischemic cardiomyopathy) (HCC)    a. EF reportedly < 30% 01/2013 ([facility]);  b. 01/2012 s/p MDT DTBB1D1 Viva S CRT-D;  c. 12/2012 Echo: EF 45-50%, inf/post HK, nl RV, mild MR.  . Non-obstructive CAD    a. s/p  multiple caths @ [facility] - last reportedly 06/2012;  b. 06/2013 Lexi MV: EF 51%, no ischemia/infarct.  . Presence of permanent cardiac pacemaker     Patient Active Problem List   Diagnosis Date Noted  . Fall   . Frequent falls 08/22/2015  . Overdose 06/22/2015  . Dementia, senile with depression 06/22/2015  . Iron deficiency anemia 10/24/2014  . GIB (gastrointestinal bleeding) 08/30/2014  . Hyponatremia 08/30/2014  . Elevated troponin 08/30/2014  . External hemorrhoids 08/30/2014  . Diverticulosis of colon without hemorrhage 08/30/2014  . Gastritis and gastroduodenitis 08/30/2014  . Acute posthemorrhagic anemia 08/25/2014  . Unsteady gait 03/04/2014  . NICM (nonischemic cardiomyopathy) (HCC) 07/20/2013  . Diabetes mellitus without complication (HCC)   . Diverticulosis   . Hiatal hernia   . Non-obstructive CAD   . Neck pain 04/21/2013  . Depression 01/26/2013  . Atrial fibrillation-permanent 01/12/2013  . Biventricular ICD (implantable cardioverter-defibrillator) -Medtronic 01/12/2013  . Congestive dilated cardiomyopathy (HCC) 01/12/2013  . GI bleed 01/12/2013  . Chronic combined systolic and diastolic CHF (congestive heart failure) (HCC) 01/12/2013  . Biventricular ICD (implantable cardioverter-defibrillator) -Medtronic 01/12/2013    Past Surgical History:  Procedure Laterality Date  . CARDIAC CATHETERIZATION  2013   [facility]   . CARDIAC CATHETERIZATION  2011  . CARDIAC DEFIBRILLATOR PLACEMENT    . COLONOSCOPY  32014  . COLONOSCOPY WITH PROPOFOL N/A 08/29/2014   Procedure: COLONOSCOPY WITH PROPOFOL;  Surgeon: Elnita Maxwell, MD;  Location: [facility] ENDOSCOPY;  Service: Endoscopy;  Laterality: N/A;  . Defib implant  01/27/2012   Serial # [id number] H Model # [id number]  . ESOPHAGOGASTRODUODENOSCOPY (EGD) WITH PROPOFOL N/A 08/29/2014   Procedure: ESOPHAGOGASTRODUODENOSCOPY (EGD) WITH PROPOFOL;  Surgeon: Elnita Maxwell, MD;  Location: [facility] ENDOSCOPY;  Service: Endoscopy;   Laterality: N/A;  . FETAL BLOOD TRANSFUSION  2014  . PACEMAKER INSERTION  01/27/2012  . UPPER GASTROINTESTINAL ENDOSCOPY  2014    Past Surgical History:  Procedure Laterality Date  . CARDIAC CATHETERIZATION  2013   [facility]   . CARDIAC CATHETERIZATION  2011  . CARDIAC DEFIBRILLATOR PLACEMENT    . COLONOSCOPY  32014  . COLONOSCOPY WITH PROPOFOL N/A 08/29/2014   Procedure: COLONOSCOPY WITH PROPOFOL;  Surgeon: Elnita Maxwell, MD;  Location: [facility] ENDOSCOPY;  Service: Endoscopy;  Laterality: N/A;  . Defib implant  01/27/2012   Serial # [id number] H Model # [id number]  . ESOPHAGOGASTRODUODENOSCOPY (EGD) WITH PROPOFOL N/A 08/29/2014   Procedure: ESOPHAGOGASTRODUODENOSCOPY (EGD) WITH PROPOFOL;  Surgeon: Elnita Maxwell, MD;  Location: [facility] ENDOSCOPY;  Service: Endoscopy;  Laterality: N/A;  . FETAL BLOOD TRANSFUSION  2014  . PACEMAKER INSERTION  01/27/2012  . UPPER GASTROINTESTINAL ENDOSCOPY  2014    Current Outpatient Rx  . Order #: [id number] Class: Historical Med  . Order #: [id number] Class: Historical Med  . Order #: [id number] Class: Historical Med  . Order #: [id number] Class: Historical Med  . Order #: [id number] Class: Historical Med  . Order #: [id number] Class: Historical Med  . Order #: [id number] Class: Historical Med  . Order #: [id number] Class: Historical Med  . Order #: [id number] Class: Print  . Order #: [id number] Class: Historical Med  . Order #: [id number] Class: Historical Med  . Order #: [id number] Class: Historical Med  . Order #: [id number] Class: Historical Med  . Order #: [id number] Class: Historical Med  . Order #: [id number] Class: Historical Med    Allergies:  Review of patient's allergies indicates no known allergies.  Family History: Family History  Problem Relation Age of Onset  . Family history unknown: Yes    Social History: Social History  Substance Use Topics  . Smoking status: Former Smoker    Packs/day: 3.00    Years: 45.00    Types: Cigarettes    Quit date:  01/13/2003  . Smokeless tobacco: Never Used  . Alcohol use No     Review of Systems:   10 point review of systems was performed and was otherwise negative: Review of systems mainly acquired from the patient, medical record, etc. Constitutional: No fever Eyes: No visual disturbances ENT: No sore throat, ear pain Cardiac: No chest pain Respiratory: No shortness of breath, wheezing, or stridor Abdomen: No abdominal pain, no vomiting, No diarrhea Endocrine: No weight loss, No night sweats Extremities: No peripheral edema, cyanosis Skin: No rashes, easy bruising Neurologic: No focal weakness, trouble with speech or swollowing Urologic: No dysuria, Hematuria, or urinary frequency The back pain and seems to be consistent from yesterday and the cervical spine pain seems to be an acute exacerbation of a chronic difficulty.  Physical Exam:  ED Triage Vitals [09/28/15 1256]  Enc Vitals Group     BP 114/77     Pulse Rate 81     Resp 17     Temp 97.8 F (36.6 C)     Temp Source Oral     SpO2 100 %     Weight 202 lb (91.6 kg)     Height  (1.88 m)     Head Circumference  Peak Flow      Pain Score      Pain Loc      Pain Edu?      Excl. in GC?     General: Awake , Alert , and Oriented times 1. The patient does follow commands. He arrives with c-collar present Head: Normal cephalic , atraumatic Eyes: Pupils equal , round, reactive to light Nose/Throat: No nasal drainage, patent upper airway without erythema or exudate.  Neck: C-collar was in place and will remain so until CT evaluation Lungs: Clear to ascultation without wheezes , rhonchi, or rales Heart: Regular rate, regular rhythm without murmurs , gallops , or rubs Abdomen: Soft, non tender without rebound, guarding , or rigidity; bowel sounds positive and symmetric in all 4 quadrants. No organomegaly .        Extremities: 2 plus symmetric pulses. No edema, clubbing or cyanosis Neurologic: normal ambulation, Motor  symmetric without deficits, sensory intact Skin: warm, dry, no rashes Patient has lower middle back discomfort. No crepitus or step-off noted. No focal neurologic deficits.  Labs:   All laboratory work was reviewed including any pertinent negatives or positives listed below:  Labs Reviewed  TROPONIN I - Abnormal; Notable for the following:       Result Value   Troponin I 0.03 (*)    All other components within normal limits  GLUCOSE, CAPILLARY - Abnormal; Notable for the following:    Glucose-Capillary 101 (*)    All other components within normal limits  CBG MONITORING, ED  Patient's laboratory work was reviewed from yesterday and troponins consistently at the same level  EKG:  ED ECG REPORT I, Jennye Moccasin, the attending physician, personally viewed and interpreted this ECG.  Date: 09/28/2015 EKG Time: 79 Rate: Pacemaker present with occasional PVCs Rhythm: normal sinus rhythm QRS Axis: normal Intervals: normal ST/T Wave abnormalities: normal Conduction Disturbances: none Narrative Interpretation: unremarkable No acute ischemic changes are noted   Radiology: CLINICAL DATA:  Neck pain after fall.  EXAM: CT CERVICAL SPINE WITHOUT CONTRAST  TECHNIQUE: Multidetector CT imaging of the cervical spine was performed without intravenous contrast. Multiplanar CT image reconstructions were also generated.  COMPARISON:  CT scan of September 15, 2015.  FINDINGS: No fracture or spondylolisthesis is noted. Moderate degenerative disc disease is noted at C5-6 and C6-7 appear hypertrophy of posterior facet joints of upper cervical spine is noted consistent with degenerative change. Visualized lung apices appear normal.  IMPRESSION: Multilevel degenerative disc disease. No acute abnormality seen in the cervical spine.   Electronically Signed   By: Lupita Raider, M.D.   On: 09/28/2015 14:15  CLINICAL DATA:  Neck pain after fall.  EXAM: CT CERVICAL SPINE WITHOUT  CONTRAST  TECHNIQUE: Multidetector CT imaging of the cervical spine was performed without intravenous contrast. Multiplanar CT image reconstructions were also generated.  COMPARISON:  CT scan of September 15, 2015.  FINDINGS: No fracture or spondylolisthesis is noted. Moderate degenerative disc disease is noted at C5-6 and C6-7 appear hypertrophy of posterior facet joints of upper cervical spine is noted consistent with degenerative change. Visualized lung apices appear normal.  IMPRESSION: Multilevel degenerative disc disease. No acute abnormality seen in the cervical spine.   Electronically Signed   By: Lupita Raider, M.D.   On: 09/28/2015 14:15     I personally reviewed the radiologic studies    ED Course:  Patient's stay here was uneventful and he may have had near syncope secondary to a vagal syndrome while  sitting on the toilet. His pacemaker is working fine and I felt he did not require inpatient observation at this time. He does not appear to suffered any new significant injury from a fall. His compression fracture located in the lumbar spine does not show any retropulsion and has no new neurologic findings.     Clinical Course     Assessment:  Near syncope Vagal syndrome L1 compression fracture      Plan:  Patient will be referred back to the nursing facility with a lidocaine patch for pain control. Lidocaine patch Patient was advised to return immediately if condition worsens. Patient was advised to follow up with their primary care physician or other specialized physicians involved in their outpatient care. The patient and/or family member/power of attorney had laboratory results reviewed at the bedside. All questions and concerns were addressed and appropriate discharge instructions were distributed by the nursing staff.             Jennye Moccasin, MD 09/28/15 1506    Jennye Moccasin, MD 09/28/15 1520

## 2015-09-28 NOTE — ED Notes (Signed)
Pt attempted to urinate with urinal, states he does not have to now.

## 2015-09-28 NOTE — Discharge Instructions (Signed)
1. Discontinue tramadol. 2. You may take Norco every 6 hours as needed for pain. 3. Return to the ER for worsening symptoms, persistent vomiting, difficulty breathing or other concerns.

## 2015-09-28 NOTE — ED Notes (Signed)
Secretary notified to contact ems for transport back to [facility]

## 2015-09-28 NOTE — ED Notes (Signed)
Called lab to check on troponin level - they stated that they would "get on it"

## 2015-09-28 NOTE — ED Notes (Signed)
Pt in CT.

## 2015-09-28 NOTE — ED Notes (Signed)
Secretary asked to check on EMS, since they were called nearly 45 minutes ago to pick this patient up for transport to [facility].

## 2015-09-28 NOTE — ED Notes (Signed)
Pt denies any other pain besides neck pain.

## 2015-09-28 NOTE — ED Notes (Signed)
Lab called and notified this nurse that they could not run digoxin level off of green top tube (despite what lab paper reads and the fact that they told this nurse they could add it on) - digoxin level must be in red top tube - lab will be drawn at this time

## 2015-09-28 NOTE — ED Notes (Signed)
Lab called elevated troponin of 0.03 - reported to Dr Dolores Frame

## 2015-09-28 NOTE — ED Notes (Signed)
Pt back from CT

## 2015-09-28 NOTE — ED Notes (Signed)
Pt states that he did not fall today, does report that he was here last night at approx 10PM for fall. Unsure if patient fell due to facility reporting that pt did fall.

## 2015-09-28 NOTE — ED Notes (Signed)
Report to Bri at [facility], pt needs transportation by [facility] per her.

## 2015-09-28 NOTE — ED Notes (Signed)
Awaiting EMS arrival

## 2015-09-28 NOTE — ED Notes (Signed)
C collar removed with MD verbal order. Pt states pain is much better in neck after collar removed.

## 2015-09-28 NOTE — ED Notes (Signed)
Pt had large bowel movement - cleaned and changed pt and bed linen

## 2015-09-28 NOTE — ED Triage Notes (Signed)
Pt arrives to ER via [facility] from [facility] after near syncope while sitting on toilet. Pt fell, resulting in neck and back pain. Pt arrives with c-collar. Pt disoriented to time and place, oriented to situation. Pt hx of dementia. CBG 96 with [facility]. Pt found to be hypotensive with the staff at [facility], 96 systolic. EMS reports normal BP and HR.

## 2015-09-28 NOTE — ED Notes (Signed)
Report given to David RN.

## 2015-09-28 NOTE — ED Provider Notes (Signed)
-----------------------------------------   2:23 AM on 09/28/2015 -----------------------------------------  Delay secondary to laboratory. Updated patient of troponin and digoxin results. Review of records reveal patient has had chronically elevated troponin, with tonight being the lowest. Will proceed with plan to discharge back to facility with analgesia as needed and orthopedics follow-up.   Irean Hong, MD 09/28/15 [phone]

## 2015-10-02 ENCOUNTER — Encounter: Payer: Self-pay | Admitting: Emergency Medicine

## 2015-10-02 ENCOUNTER — Emergency Department: Payer: Medicare Other

## 2015-10-02 ENCOUNTER — Emergency Department
Admission: EM | Admit: 2015-10-02 | Discharge: 2015-10-02 | Disposition: A | Discharge status: Home / Self Care | Payer: Medicare Other | Attending: Emergency Medicine | Admitting: Emergency Medicine

## 2015-10-02 DIAGNOSIS — Z043 Encounter for examination and observation following other accident: Secondary | ICD-10-CM | POA: Diagnosis present

## 2015-10-02 DIAGNOSIS — I4891 Unspecified atrial fibrillation: Secondary | ICD-10-CM | POA: Diagnosis not present

## 2015-10-02 DIAGNOSIS — Z7982 Long term (current) use of aspirin: Secondary | ICD-10-CM | POA: Diagnosis not present

## 2015-10-02 DIAGNOSIS — Z79899 Other long term (current) drug therapy: Secondary | ICD-10-CM | POA: Insufficient documentation

## 2015-10-02 DIAGNOSIS — I110 Hypertensive heart disease with heart failure: Secondary | ICD-10-CM | POA: Diagnosis not present

## 2015-10-02 DIAGNOSIS — E119 Type 2 diabetes mellitus without complications: Secondary | ICD-10-CM | POA: Diagnosis not present

## 2015-10-02 DIAGNOSIS — F0390 Unspecified dementia without behavioral disturbance: Secondary | ICD-10-CM | POA: Diagnosis not present

## 2015-10-02 DIAGNOSIS — W19XXXD Unspecified fall, subsequent encounter: Secondary | ICD-10-CM | POA: Diagnosis not present

## 2015-10-02 DIAGNOSIS — I5042 Chronic combined systolic (congestive) and diastolic (congestive) heart failure: Secondary | ICD-10-CM | POA: Insufficient documentation

## 2015-10-02 DIAGNOSIS — Z87891 Personal history of nicotine dependence: Secondary | ICD-10-CM | POA: Diagnosis not present

## 2015-10-02 MED ORDER — OXYCODONE-ACETAMINOPHEN 5-325 MG PO TABS
2.0000 | ORAL_TABLET | Freq: Once | ORAL | Status: AC
  Administered 2015-10-02: 2 via ORAL
  Filled 2015-10-02: qty 2

## 2015-10-02 NOTE — ED Notes (Signed)
Pt discharged home after verbalizing understanding of discharge instructions; nad noted. 

## 2015-10-02 NOTE — ED Provider Notes (Signed)
[facility] Emergency Department Provider Note      L5 caveat: Review of systems and history is limited by dementia  Time seen: ----------------------------------------- 8:52 AM on 10/02/2015 -----------------------------------------    I have reviewed the triage vital signs and the nursing notes.   HISTORY  Chief Complaint No chief complaint on file.    HPI Nicolas Morris is a 82 y.o. male who presents to ER after an unwitnessed fall at the [facility]. Patient reportedly has a history of same recently.Patient is not sure why he fell, no further information is available.   Past Medical History:  Diagnosis Date  . A-fib (HCC)    a. Permanent, on Xarelto.  Marland Kitchen. AICD (automatic cardioverter/defibrillator) present   . Biventricular ICD (implantable cardioverter-defibrillator) -Medtronic 01/12/2013   a. 01/2012 [facility]:  01/2012 s/p MDT DTBB1D1 Viva S CRT-D.  Atrial port capped (afib).  . Diabetes mellitus without complication (HCC)   . Diverticulosis    a. 12/2012 noted on colonoscopy.  . Gastritis    a. 12/2012 noted on EGD.  . H/O: GI bleed    a. 12/2012 EGD: HH, evidence of reflux. Erosive Gastritis. Nl duodenum;  b. 12/2012 Colonoscopy: Sev sigmoid and desc colon diverticulosis, transverse and ascending colon diverticulosis, nonbleeding internal hemorrhoids, no bleeding.  . Hiatal hernia    a. 12/2012 noted on EGD.  Marland Kitchen. Hypertension   . Iron deficiency anemia   . NICM (nonischemic cardiomyopathy) (HCC)    a. EF reportedly < 30% 01/2013 ([facility]);  b. 01/2012 s/p MDT DTBB1D1 Viva S CRT-D;  c. 12/2012 Echo: EF 45-50%, inf/post HK, nl RV, mild MR.  . Non-obstructive CAD    a. s/p multiple caths @ [facility] - last reportedly 06/2012;  b. 06/2013 Lexi MV: EF 51%, no ischemia/infarct.  . Presence of permanent cardiac pacemaker     Patient Active Problem List   Diagnosis Date Noted  . Fall   . Frequent falls 08/22/2015  . Overdose 06/22/2015  . Dementia,  senile with depression 06/22/2015  . Iron deficiency anemia 10/24/2014  . GIB (gastrointestinal bleeding) 08/30/2014  . Hyponatremia 08/30/2014  . Elevated troponin 08/30/2014  . External hemorrhoids 08/30/2014  . Diverticulosis of colon without hemorrhage 08/30/2014  . Gastritis and gastroduodenitis 08/30/2014  . Acute posthemorrhagic anemia 08/25/2014  . Unsteady gait 03/04/2014  . NICM (nonischemic cardiomyopathy) (HCC) 07/20/2013  . Diabetes mellitus without complication (HCC)   . Diverticulosis   . Hiatal hernia   . Non-obstructive CAD   . Neck pain 04/21/2013  . Depression 01/26/2013  . Atrial fibrillation-permanent 01/12/2013  . Biventricular ICD (implantable cardioverter-defibrillator) -Medtronic 01/12/2013  . Congestive dilated cardiomyopathy (HCC) 01/12/2013  . GI bleed 01/12/2013  . Chronic combined systolic and diastolic CHF (congestive heart failure) (HCC) 01/12/2013  . Biventricular ICD (implantable cardioverter-defibrillator) -Medtronic 01/12/2013    Past Surgical History:  Procedure Laterality Date  . CARDIAC CATHETERIZATION  2013   [facility]   . CARDIAC CATHETERIZATION  2011  . CARDIAC DEFIBRILLATOR PLACEMENT    . COLONOSCOPY  32014  . COLONOSCOPY WITH PROPOFOL N/A 08/29/2014   Procedure: COLONOSCOPY WITH PROPOFOL;  Surgeon: Elnita MaxwellMatthew Gordon Rein, MD;  Location[facility] ENDOSCOPY;  Service: Endoscopy;  Laterality: N/A;  . Defib implant  01/27/2012   Serial # [id number] H Model # [id number]  . ESOPHAGOGASTRODUODENOSCOPY (EGD) WITH PROPOFOL N/A 08/29/2014   Procedure: ESOPHAGOGASTRODUODENOSCOPY (EGD) WITH PROPOFOL;  Surgeon: Elnita MaxwellMatthew Gordon Rein, MD;  [facility] ENDOSCOPY;  Service: Endoscopy;  Laterality: N/A;  . FETAL BLOOD TRANSFUSION  2014  . PACEMAKER INSERTION  01/27/2012  . UPPER GASTROINTESTINAL ENDOSCOPY  2014    Allergies Review of patient's allergies indicates no known allergies.  Social History Social History  Substance Use Topics  . Smoking status:  Former Smoker    Packs/day: 3.00    Years: 45.00    Types: Cigarettes    Quit date: 01/13/2003  . Smokeless tobacco: Never Used  . Alcohol use No    Review of Systems Unknown at this time Patient describing pain in his head and neck back and hips. ____________________________________________   PHYSICAL EXAM:  VITAL SIGNS: ED Triage Vitals  Enc Vitals Group     BP      Pulse      Resp      Temp      Temp src      SpO2      Weight      Height      Head Circumference      Peak Flow      Pain Score      Pain Loc      Pain Edu?      Excl. in GC?     Constitutional: Alert but disoriented, Well appearing and in no distress. Eyes: Conjunctivae are normal. PERRL. Normal extraocular movements. ENT   Head: Normocephalic and atraumatic. Patient describes soreness in his posterior scalp   Nose: No congestion/rhinnorhea.   Mouth/Throat: Mucous membranes are moist.   Neck: No stridor. Cardiovascular: Normal rate, regular rhythm. No murmurs, rubs, or gallops. Respiratory: Normal respiratory effort without tachypnea nor retractions. Breath sounds are clear and equal bilaterally. No wheezes/rales/rhonchi. Gastrointestinal: Soft and nontender. Normal bowel sounds Musculoskeletal: Tenderness over both hips, tenderness over the lumbar spine, cervical spine. Neurologic:  Normal speech and language. No gross focal neurologic deficits are appreciated.  Skin:  Skin is warm, dry and intact. No rash noted. Psychiatric: Mood and affect are normal. Speech and behavior are normal.  ____________________________________________  EKG: Interpreted by me. Atrial fibrillation with a rate of 76 bpm, wide QRS, normal QT, bundle branch block  ____________________________________________  ED COURSE:  Pertinent labs & imaging results that were available during my care of the patient were reviewed by me and considered in my medical decision making (see chart for details). Clinical Course   Patient presents after mechanical fall. We will obtain basic imaging, provide oral pain medicine.  Procedures ____________________________________________   RADIOLOGY  CT head, C-spine, lumbosacral spine, pelvis x-rays IMPRESSION: CT of the head: Chronic atrophic and ischemic changes without acute abnormality.  CT of cervical spine: Multilevel degenerative change without acute Abnormality. AP pelvis IMPRESSION: No acute bony abnormality noted. Lumbar spine IMPRESSION: No acute fracture or subluxation. Again noted fracture of L1 vertebral body with alignment preserved. Degenerative changes as described above.   ____________________________________________  FINAL ASSESSMENT AND PLAN  Fall, dementia  Plan: Patient with imaging as dictated above. Patient is in no acute distress, vital signs appear stable. Patient with dementia and frequent falls. He appears stable for discharge.   Emily Filbert, MD   Note: This dictation was prepared with Dragon dictation. Any transcriptional errors that result from this process are unintentional    Emily Filbert, MD 10/02/15 1014

## 2015-10-02 NOTE — ED Triage Notes (Signed)
Pt via ems from the [facility] after unwitnessed fall. Pt does not know if he lost consciousness; c/o lower back pain. Has existing lower back fx from previous fall. VSS with EMS.

## 2015-10-06 ENCOUNTER — Encounter: Payer: Self-pay | Admitting: Emergency Medicine

## 2015-10-06 ENCOUNTER — Observation Stay
Admission: EM | Admit: 2015-10-06 | Discharge: 2015-10-08 | Payer: Medicare Other | Attending: Internal Medicine | Admitting: Internal Medicine

## 2015-10-06 ENCOUNTER — Emergency Department: Payer: Medicare Other

## 2015-10-06 DIAGNOSIS — M4806 Spinal stenosis, lumbar region: Secondary | ICD-10-CM | POA: Diagnosis not present

## 2015-10-06 DIAGNOSIS — M4856XA Collapsed vertebra, not elsewhere classified, lumbar region, initial encounter for fracture: Secondary | ICD-10-CM | POA: Diagnosis not present

## 2015-10-06 DIAGNOSIS — M5136 Other intervertebral disc degeneration, lumbar region: Secondary | ICD-10-CM | POA: Diagnosis not present

## 2015-10-06 DIAGNOSIS — E871 Hypo-osmolality and hyponatremia: Secondary | ICD-10-CM | POA: Diagnosis not present

## 2015-10-06 DIAGNOSIS — I5042 Chronic combined systolic (congestive) and diastolic (congestive) heart failure: Secondary | ICD-10-CM | POA: Diagnosis not present

## 2015-10-06 DIAGNOSIS — R32 Unspecified urinary incontinence: Secondary | ICD-10-CM | POA: Insufficient documentation

## 2015-10-06 DIAGNOSIS — K449 Diaphragmatic hernia without obstruction or gangrene: Secondary | ICD-10-CM | POA: Insufficient documentation

## 2015-10-06 DIAGNOSIS — F0390 Unspecified dementia without behavioral disturbance: Secondary | ICD-10-CM | POA: Diagnosis not present

## 2015-10-06 DIAGNOSIS — M545 Low back pain, unspecified: Secondary | ICD-10-CM

## 2015-10-06 DIAGNOSIS — Z8262 Family history of osteoporosis: Secondary | ICD-10-CM | POA: Insufficient documentation

## 2015-10-06 DIAGNOSIS — I2510 Atherosclerotic heart disease of native coronary artery without angina pectoris: Secondary | ICD-10-CM | POA: Insufficient documentation

## 2015-10-06 DIAGNOSIS — N401 Enlarged prostate with lower urinary tract symptoms: Secondary | ICD-10-CM | POA: Insufficient documentation

## 2015-10-06 DIAGNOSIS — S32000A Wedge compression fracture of unspecified lumbar vertebra, initial encounter for closed fracture: Secondary | ICD-10-CM | POA: Diagnosis present

## 2015-10-06 DIAGNOSIS — D509 Iron deficiency anemia, unspecified: Secondary | ICD-10-CM | POA: Insufficient documentation

## 2015-10-06 DIAGNOSIS — Z793 Long term (current) use of hormonal contraceptives: Secondary | ICD-10-CM | POA: Insufficient documentation

## 2015-10-06 DIAGNOSIS — I4891 Unspecified atrial fibrillation: Secondary | ICD-10-CM | POA: Diagnosis not present

## 2015-10-06 DIAGNOSIS — Z809 Family history of malignant neoplasm, unspecified: Secondary | ICD-10-CM | POA: Insufficient documentation

## 2015-10-06 DIAGNOSIS — E119 Type 2 diabetes mellitus without complications: Secondary | ICD-10-CM | POA: Insufficient documentation

## 2015-10-06 DIAGNOSIS — I110 Hypertensive heart disease with heart failure: Secondary | ICD-10-CM | POA: Insufficient documentation

## 2015-10-06 DIAGNOSIS — M549 Dorsalgia, unspecified: Secondary | ICD-10-CM

## 2015-10-06 DIAGNOSIS — Z95810 Presence of automatic (implantable) cardiac defibrillator: Secondary | ICD-10-CM | POA: Diagnosis not present

## 2015-10-06 DIAGNOSIS — E860 Dehydration: Secondary | ICD-10-CM | POA: Insufficient documentation

## 2015-10-06 DIAGNOSIS — R296 Repeated falls: Secondary | ICD-10-CM | POA: Insufficient documentation

## 2015-10-06 DIAGNOSIS — K2990 Gastroduodenitis, unspecified, without bleeding: Secondary | ICD-10-CM | POA: Diagnosis not present

## 2015-10-06 DIAGNOSIS — K2970 Gastritis, unspecified, without bleeding: Secondary | ICD-10-CM | POA: Insufficient documentation

## 2015-10-06 DIAGNOSIS — F329 Major depressive disorder, single episode, unspecified: Secondary | ICD-10-CM | POA: Insufficient documentation

## 2015-10-06 DIAGNOSIS — M5126 Other intervertebral disc displacement, lumbar region: Secondary | ICD-10-CM | POA: Diagnosis not present

## 2015-10-06 DIAGNOSIS — Z7984 Long term (current) use of oral hypoglycemic drugs: Secondary | ICD-10-CM | POA: Insufficient documentation

## 2015-10-06 DIAGNOSIS — I700 Atherosclerosis of aorta: Secondary | ICD-10-CM | POA: Diagnosis not present

## 2015-10-06 DIAGNOSIS — K644 Residual hemorrhoidal skin tags: Secondary | ICD-10-CM | POA: Insufficient documentation

## 2015-10-06 DIAGNOSIS — R338 Other retention of urine: Secondary | ICD-10-CM | POA: Insufficient documentation

## 2015-10-06 DIAGNOSIS — K5730 Diverticulosis of large intestine without perforation or abscess without bleeding: Secondary | ICD-10-CM | POA: Insufficient documentation

## 2015-10-06 DIAGNOSIS — M5137 Other intervertebral disc degeneration, lumbosacral region: Secondary | ICD-10-CM | POA: Diagnosis not present

## 2015-10-06 DIAGNOSIS — I420 Dilated cardiomyopathy: Secondary | ICD-10-CM | POA: Insufficient documentation

## 2015-10-06 DIAGNOSIS — Z87891 Personal history of nicotine dependence: Secondary | ICD-10-CM | POA: Insufficient documentation

## 2015-10-06 DIAGNOSIS — Z7982 Long term (current) use of aspirin: Secondary | ICD-10-CM | POA: Insufficient documentation

## 2015-10-06 HISTORY — DX: Reserved for concepts with insufficient information to code with codable children: IMO0002

## 2015-10-06 LAB — URINALYSIS COMPLETE WITH MICROSCOPIC (ARMC ONLY)
BILIRUBIN URINE: NEGATIVE
Bacteria, UA: NONE SEEN
Glucose, UA: NEGATIVE mg/dL
Hgb urine dipstick: NEGATIVE
LEUKOCYTES UA: NEGATIVE
Nitrite: NEGATIVE
Protein, ur: 100 mg/dL — AB
Specific Gravity, Urine: 1.033 — ABNORMAL HIGH (ref 1.005–1.030)
pH: 5 (ref 5.0–8.0)

## 2015-10-06 LAB — BASIC METABOLIC PANEL
ANION GAP: 8 (ref 5–15)
BUN: 30 mg/dL — AB (ref 6–20)
CALCIUM: 9.6 mg/dL (ref 8.9–10.3)
CO2: 27 mmol/L (ref 22–32)
Chloride: 103 mmol/L (ref 101–111)
Creatinine, Ser: 1 mg/dL (ref 0.61–1.24)
GFR calc Af Amer: >60 mL/min (ref >60)
GLUCOSE: 137 mg/dL — AB (ref 65–99)
Potassium: 4.1 mmol/L (ref 3.5–5.1)
Sodium: 138 mmol/L (ref 135–145)

## 2015-10-06 LAB — CBC WITH DIFFERENTIAL/PLATELET
BASOS ABS: 0 10*3/uL (ref 0–0.1)
Basophils Relative: 1 %
EOS ABS: 0 10*3/uL (ref 0–0.7)
EOS PCT: 1 %
HCT: 46.2 % (ref 40.0–52.0)
Hemoglobin: 15.7 g/dL (ref 13.0–18.0)
LYMPHS PCT: 15 %
Lymphs Abs: 1 10*3/uL (ref 1.0–3.6)
MCH: 27.5 pg (ref 26.0–34.0)
MCHC: 34 g/dL (ref 32.0–36.0)
MCV: 80.8 fL (ref 80.0–100.0)
MONO ABS: 0.6 10*3/uL (ref 0.2–1.0)
Monocytes Relative: 8 %
Neutro Abs: 5.4 10*3/uL (ref 1.4–6.5)
Neutrophils Relative %: 75 %
PLATELETS: 166 10*3/uL (ref 150–440)
RBC: 5.71 MIL/uL (ref 4.40–5.90)
RDW: 16.9 % — AB (ref 11.5–14.5)
WBC: 7.2 10*3/uL (ref 3.8–10.6)

## 2015-10-06 LAB — MRSA PCR SCREENING

## 2015-10-06 LAB — GLUCOSE, CAPILLARY: GLUCOSE-CAPILLARY: 137 mg/dL — AB (ref 65–99)

## 2015-10-06 MED ORDER — POTASSIUM CHLORIDE CRYS ER 20 MEQ PO TBCR
20.0000 meq | EXTENDED_RELEASE_TABLET | Freq: Every day | ORAL | Status: DC
  Administered 2015-10-07 – 2015-10-08 (×2): 20 meq via ORAL
  Filled 2015-10-06 (×2): qty 1

## 2015-10-06 MED ORDER — HYDROCODONE-ACETAMINOPHEN 5-325 MG PO TABS
1.0000 | ORAL_TABLET | Freq: Four times a day (QID) | ORAL | Status: DC | PRN
  Administered 2015-10-08: 1 via ORAL
  Filled 2015-10-06: qty 1

## 2015-10-06 MED ORDER — SENNOSIDES-DOCUSATE SODIUM 8.6-50 MG PO TABS: 1.0000 | ORAL_TABLET | Freq: Every evening | ORAL | Status: DC | PRN

## 2015-10-06 MED ORDER — CARVEDILOL 3.125 MG PO TABS
3.1250 mg | ORAL_TABLET | Freq: Two times a day (BID) | ORAL | Status: DC
  Administered 2015-10-06 – 2015-10-08 (×4): 3.125 mg via ORAL
  Filled 2015-10-06 (×4): qty 1

## 2015-10-06 MED ORDER — FUROSEMIDE 20 MG PO TABS
20.0000 mg | ORAL_TABLET | Freq: Every day | ORAL | Status: DC
  Administered 2015-10-06 – 2015-10-08 (×3): 20 mg via ORAL
  Filled 2015-10-06 (×3): qty 1

## 2015-10-06 MED ORDER — ACETAMINOPHEN 650 MG RE SUPP: 650.0000 mg | Freq: Four times a day (QID) | RECTAL | Status: DC | PRN

## 2015-10-06 MED ORDER — ATORVASTATIN CALCIUM 20 MG PO TABS
80.0000 mg | ORAL_TABLET | Freq: Every day | ORAL | Status: DC
  Administered 2015-10-06 – 2015-10-08 (×3): 80 mg via ORAL
  Filled 2015-10-06 (×3): qty 4

## 2015-10-06 MED ORDER — SODIUM CHLORIDE 0.9 % IV SOLN: INTRAVENOUS | Status: DC

## 2015-10-06 MED ORDER — MELATONIN 5 MG PO TABS
2.5000 mg | ORAL_TABLET | Freq: Every day | ORAL | Status: DC
  Administered 2015-10-06 – 2015-10-07 (×2): 2.5 mg via ORAL
  Filled 2015-10-06 (×2): qty 0.5

## 2015-10-06 MED ORDER — ONDANSETRON HCL 4 MG/2ML IJ SOLN: 4.0000 mg | Freq: Four times a day (QID) | INTRAMUSCULAR | Status: DC | PRN

## 2015-10-06 MED ORDER — DIGOXIN 250 MCG PO TABS
0.2500 mg | ORAL_TABLET | Freq: Every day | ORAL | Status: DC
  Administered 2015-10-07 – 2015-10-08 (×2): 0.25 mg via ORAL
  Filled 2015-10-06 (×4): qty 1

## 2015-10-06 MED ORDER — LISINOPRIL 5 MG PO TABS
2.5000 mg | ORAL_TABLET | Freq: Every day | ORAL | Status: DC
Start: 2015-10-06 — End: 2015-10-08
  Administered 2015-10-06 – 2015-10-08 (×2): 2.5 mg via ORAL
  Filled 2015-10-06 (×3): qty 1

## 2015-10-06 MED ORDER — MORPHINE SULFATE (PF) 2 MG/ML IV SOLN
2.0000 mg | Freq: Once | INTRAVENOUS | Status: AC
  Administered 2015-10-06: 2 mg via INTRAVENOUS
  Filled 2015-10-06: qty 1

## 2015-10-06 MED ORDER — ALBUTEROL SULFATE (2.5 MG/3ML) 0.083% IN NEBU: 2.5000 mg | INHALATION_SOLUTION | RESPIRATORY_TRACT | Status: DC | PRN

## 2015-10-06 MED ORDER — PANTOPRAZOLE SODIUM 40 MG PO TBEC
40.0000 mg | DELAYED_RELEASE_TABLET | Freq: Every day | ORAL | Status: DC
Start: 2015-10-06 — End: 2015-10-08
  Administered 2015-10-06 – 2015-10-08 (×3): 40 mg via ORAL
  Filled 2015-10-06 (×3): qty 1

## 2015-10-06 MED ORDER — GABAPENTIN 600 MG PO TABS
600.0000 mg | ORAL_TABLET | Freq: Three times a day (TID) | ORAL | Status: DC
  Administered 2015-10-06 – 2015-10-08 (×5): 600 mg via ORAL
  Filled 2015-10-06 (×5): qty 1

## 2015-10-06 MED ORDER — ONDANSETRON HCL 4 MG PO TABS: 4.0000 mg | ORAL_TABLET | Freq: Four times a day (QID) | ORAL | Status: DC | PRN

## 2015-10-06 MED ORDER — ENOXAPARIN SODIUM 40 MG/0.4ML ~~LOC~~ SOLN
40.0000 mg | SUBCUTANEOUS | Status: DC
  Administered 2015-10-06 – 2015-10-07 (×2): 40 mg via SUBCUTANEOUS
  Filled 2015-10-06 (×2): qty 0.4

## 2015-10-06 MED ORDER — DOCUSATE SODIUM 100 MG PO CAPS
100.0000 mg | ORAL_CAPSULE | Freq: Two times a day (BID) | ORAL | Status: DC
  Administered 2015-10-06 – 2015-10-08 (×4): 100 mg via ORAL
  Filled 2015-10-06 (×4): qty 1

## 2015-10-06 MED ORDER — INSULIN ASPART 100 UNIT/ML ~~LOC~~ SOLN: 0.0000 [IU] | Freq: Three times a day (TID) | SUBCUTANEOUS | Status: DC

## 2015-10-06 MED ORDER — MELATONIN 1 MG PO TABS: 1.0000 mg | ORAL_TABLET | Freq: Every day | ORAL | Status: DC

## 2015-10-06 MED ORDER — SODIUM CHLORIDE 0.9 % IV BOLUS (SEPSIS)
500.0000 mL | Freq: Once | INTRAVENOUS | Status: AC
  Administered 2015-10-06: 500 mL via INTRAVENOUS

## 2015-10-06 MED ORDER — DEXAMETHASONE SODIUM PHOSPHATE 10 MG/ML IJ SOLN
10.0000 mg | Freq: Once | INTRAMUSCULAR | Status: AC
  Administered 2015-10-06: 10 mg via INTRAVENOUS
  Filled 2015-10-06: qty 1

## 2015-10-06 MED ORDER — TRAMADOL HCL 50 MG PO TABS
50.0000 mg | ORAL_TABLET | Freq: Four times a day (QID) | ORAL | Status: DC | PRN
  Administered 2015-10-07 (×2): 50 mg via ORAL
  Filled 2015-10-06 (×2): qty 1

## 2015-10-06 MED ORDER — BUPROPION HCL ER (XL) 150 MG PO TB24
300.0000 mg | ORAL_TABLET | Freq: Every day | ORAL | Status: DC
  Administered 2015-10-07 – 2015-10-08 (×2): 300 mg via ORAL
  Filled 2015-10-06 (×2): qty 2

## 2015-10-06 MED ORDER — SERTRALINE HCL 100 MG PO TABS
100.0000 mg | ORAL_TABLET | Freq: Every day | ORAL | Status: DC
  Administered 2015-10-07 – 2015-10-08 (×2): 100 mg via ORAL
  Filled 2015-10-06 (×2): qty 1

## 2015-10-06 MED ORDER — FINASTERIDE 5 MG PO TABS
5.0000 mg | ORAL_TABLET | Freq: Every day | ORAL | Status: DC
  Administered 2015-10-06 – 2015-10-08 (×3): 5 mg via ORAL
  Filled 2015-10-06 (×3): qty 1

## 2015-10-06 MED ORDER — TAMSULOSIN HCL 0.4 MG PO CAPS
0.4000 mg | ORAL_CAPSULE | Freq: Every day | ORAL | Status: DC
  Administered 2015-10-06 – 2015-10-07 (×2): 0.4 mg via ORAL
  Filled 2015-10-06 (×2): qty 1

## 2015-10-06 MED ORDER — INSULIN ASPART 100 UNIT/ML ~~LOC~~ SOLN: 0.0000 [IU] | Freq: Every day | SUBCUTANEOUS | Status: DC

## 2015-10-06 MED ORDER — KETOROLAC TROMETHAMINE 15 MG/ML IJ SOLN: 15.0000 mg | Freq: Four times a day (QID) | INTRAMUSCULAR | Status: DC | PRN

## 2015-10-06 MED ORDER — ASPIRIN 81 MG PO CHEW
81.0000 mg | CHEWABLE_TABLET | Freq: Every day | ORAL | Status: DC
  Administered 2015-10-06 – 2015-10-08 (×3): 81 mg via ORAL
  Filled 2015-10-06 (×3): qty 1

## 2015-10-06 MED ORDER — ACETAMINOPHEN 325 MG PO TABS: 650.0000 mg | ORAL_TABLET | Freq: Four times a day (QID) | ORAL | Status: DC | PRN

## 2015-10-06 NOTE — H&P (Signed)
[employer] [facility] [place] [facility]   PATIENT NAME: Nicolas Morris    MR#:  [id number]  DATE OF BIRTH:  05/17/1933  DATE OF ADMISSION:  10/06/2015  PRIMARY CARE PHYSICIAN: WHITE, Arlyss Repress, NP   REQUESTING/REFERRING PHYSICIAN: Nita Sickle, MD  CHIEF COMPLAINT:   Chief Complaint  Patient presents with  . Urinary Retention   Urinary retention HISTORY OF PRESENT ILLNESS:  Nicolas Morris  is a 82 y.o. male with a known history of A. fib, hypertension, diabetes and dementia , CAD. The patient hadmultiple falls in the present in the ED with low back pain 1 week ago. He was found to have acute lumbar spine L1 fracture. He was treated with pain medication and sent back to nursing home. He was found to have decreased urine output and urinary retention. He also reported worsening back pain. Denies saddle anesthesia. He is incontinent of urine at baseline. CT of the lumbar spine show worsening compression L1 fracture. ED physician discussed with the neurosurgeon in [facility] hospital, who suggested pain control without indication for intervention.  PAST MEDICAL HISTORY:   Past Medical History:  Diagnosis Date  . A-fib (HCC)    a. Permanent, on Xarelto.  Marland Kitchen AICD (automatic cardioverter/defibrillator) present   . Biventricular ICD (implantable cardioverter-defibrillator) -Medtronic 01/12/2013   a. 01/2012 [facility]:  01/2012 s/p MDT DTBB1D1 Viva S CRT-D.  Atrial port capped (afib).  . Diabetes mellitus without complication (HCC)   . Diverticulosis    a. 12/2012 noted on colonoscopy.  . Gastritis    a. 12/2012 noted on EGD.  . H/O: GI bleed    a. 12/2012 EGD: HH, evidence of reflux. Erosive Gastritis. Nl duodenum;  b. 12/2012 Colonoscopy: Sev sigmoid and desc colon diverticulosis, transverse and ascending colon diverticulosis, nonbleeding internal hemorrhoids, no bleeding.  . Hiatal hernia    a. 12/2012 noted on EGD.  Marland Kitchen Hypertension   . Iron deficiency anemia   .  NICM (nonischemic cardiomyopathy) (HCC)    a. EF reportedly < 30% 01/2013 ([facility]);  b. 01/2012 s/p MDT DTBB1D1 Viva S CRT-D;  c. 12/2012 Echo: EF 45-50%, inf/post HK, nl RV, mild MR.  . Non-obstructive CAD    a. s/p multiple caths @ [facility] - last reportedly 06/2012;  b. 06/2013 Lexi MV: EF 51%, no ischemia/infarct.  . Presence of permanent cardiac pacemaker     PAST SURGICAL HISTORY:   Past Surgical History:  Procedure Laterality Date  . CARDIAC CATHETERIZATION  2013   [facility]   . CARDIAC CATHETERIZATION  2011  . CARDIAC DEFIBRILLATOR PLACEMENT    . COLONOSCOPY  32014  . COLONOSCOPY WITH PROPOFOL N/A 08/29/2014   Procedure: COLONOSCOPY WITH PROPOFOL;  Surgeon: Elnita Maxwell, MD;  Location: [facility] ENDOSCOPY;  Service: Endoscopy;  Laterality: N/A;  . Defib implant  01/27/2012   Serial # [id number] H Model # [id number]  . ESOPHAGOGASTRODUODENOSCOPY (EGD) WITH PROPOFOL N/A 08/29/2014   Procedure: ESOPHAGOGASTRODUODENOSCOPY (EGD) WITH PROPOFOL;  Surgeon: Elnita Maxwell, MD;  Location: [facility] ENDOSCOPY;  Service: Endoscopy;  Laterality: N/A;  . FETAL BLOOD TRANSFUSION  2014  . PACEMAKER INSERTION  01/27/2012  . UPPER GASTROINTESTINAL ENDOSCOPY  2014    SOCIAL HISTORY:   Social History  Substance Use Topics  . Smoking status: Former Smoker    Packs/day: 3.00    Years: 45.00    Types: Cigarettes    Quit date: 01/13/2003  . Smokeless tobacco: Never Used  . Alcohol use No    FAMILY HISTORY:  Family History  Problem Relation Age of Onset  . Osteoporosis Mother   . Cancer Sister     DRUG ALLERGIES:  No Known Allergies  REVIEW OF SYSTEMS:   Review of Systems  Constitutional: Negative for chills and fever.  HENT: Negative for congestion.   Eyes: Negative for blurred vision and double vision.  Respiratory: Negative for cough, hemoptysis, shortness of breath and wheezing.   Cardiovascular: Negative for chest pain, palpitations, orthopnea and leg swelling.  Gastrointestinal:  Negative for nausea and vomiting.  Genitourinary: Negative for dysuria.       Urinary retention and incontinence.  Musculoskeletal: Positive for back pain and falls. Negative for neck pain.  Neurological: Negative for dizziness, tingling, focal weakness, loss of consciousness and headaches.    MEDICATIONS AT HOME:   Prior to Admission medications   Medication Sig Start Date End Date Taking? Authorizing Provider  aspirin 81 MG chewable tablet Chew by mouth daily.    Historical Provider, MD  atorvastatin (LIPITOR) 80 MG tablet Take 80 mg by mouth daily.    Historical Provider, MD  buPROPion (WELLBUTRIN XL) 300 MG 24 hr tablet Take 300 mg by mouth daily.    Historical Provider, MD  carvedilol (COREG) 3.125 MG tablet Take 3.125 mg by mouth 2 (two) times daily.     Historical Provider, MD  digoxin (LANOXIN) 0.25 MG tablet Take 0.25 mg by mouth daily. 01/26/13   Duke Salvia, MD  finasteride (PROSCAR) 5 MG tablet Take 5 mg by mouth daily.    Historical Provider, MD  furosemide (LASIX) 20 MG tablet Take 20 mg by mouth daily.    Historical Provider, MD  gabapentin (NEURONTIN) 600 MG tablet Take 600 mg by mouth 3 (three) times daily.    Historical Provider, MD  HYDROcodone-acetaminophen (NORCO) 5-325 MG tablet Take 1 tablet by mouth every 6 (six) hours as needed for moderate pain. 09/28/15   Irean Hong, MD  lidocaine (LIDODERM) 5 % Place 1 patch onto the skin daily. 09/28/15   Jennye Moccasin, MD  lisinopril (PRINIVIL,ZESTRIL) 2.5 MG tablet Take 2.5 mg by mouth daily.    Historical Provider, MD  Melatonin 1 MG TABS Take 1 mg by mouth at bedtime.    Historical Provider, MD  pantoprazole (PROTONIX) 40 MG tablet Take 40 mg by mouth daily.     Historical Provider, MD  potassium chloride SA (K-DUR,KLOR-CON) 20 MEQ tablet Take 20 mEq by mouth daily.  02/17/15   Historical Provider, MD  sertraline (ZOLOFT) 100 MG tablet Take 100 mg by mouth daily.    Historical Provider, MD  tamsulosin (FLOMAX) 0.4 MG CAPS  capsule Take 0.4 mg by mouth daily. Take 30 minutes after a meal    Historical Provider, MD      VITAL SIGNS:  Blood pressure 123/65, pulse 74, resp. rate 16, height 6\' 2"  (1.88 m), weight 233 lb (105.7 kg), SpO2 96 %.  PHYSICAL EXAMINATION:  Physical Exam  GENERAL:  82 y.o.-year-old patient lying in the bed with no acute distress.  EYES: Pupils equal, round, reactive to light and accommodation. No scleral icterus. Extraocular muscles intact.  HEENT: Head atraumatic, normocephalic. Oropharynx and nasopharynx clear. Dry oral mucosa. NECK:  Supple, no jugular venous distention. No thyroid enlargement, no tenderness.  LUNGS: Normal breath sounds bilaterally, no wheezing, rales,rhonchi or crepitation. No use of accessory muscles of respiration.  CARDIOVASCULAR: S1, S2 normal. No murmurs, rubs, or gallops.  ABDOMEN: Soft, nontender, nondistended. Bowel sounds present. No organomegaly or mass.  EXTREMITIES: No pedal edema, cyanosis, or clubbing.  NEUROLOGIC: Cranial nerves II through XII are intact. Muscle strength 4/5 in all extremities. Sensation intact. Gait not checked.  PSYCHIATRIC: The patient is alert and oriented x 2.  SKIN: No obvious rash, lesion, or ulcer.   LABORATORY PANEL:   CBC  Recent Labs Lab 10/06/15 1149  WBC 7.2  HGB 15.7  HCT 46.2  PLT 166   ------------------------------------------------------------------------------------------------------------------  Chemistries   Recent Labs Lab 10/06/15 1149  NA 138  K 4.1  CL 103  CO2 27  GLUCOSE 137*  BUN 30*  CREATININE 1.00  CALCIUM 9.6   ------------------------------------------------------------------------------------------------------------------  Cardiac Enzymes No results for input(s): TROPONINI in the last 168 hours. ------------------------------------------------------------------------------------------------------------------  RADIOLOGY:  Ct Lumbar Spine Wo Contrast  Result Date:  10/06/2015 CLINICAL DATA:  Back and bladder pain, numerous falls in past 2 months, low back pain, question fracture or spinal stenosis EXAM: CT LUMBAR SPINE WITHOUT CONTRAST TECHNIQUE: Multidetector CT imaging of the lumbar spine was performed without intravenous contrast administration. Multiplanar CT image reconstructions were also generated. COMPARISON:  CT lumbar spine 09/28/2015 FINDINGS: Prior exam labeled with 5 lumbar vertebra, current exam labeled accordingly. Marked osseous demineralization. Compression fracture L1 vertebral body with slightly increased central height loss and increased vacuum phenomenon versus gas within vertebral body. Mild retropulsion of a posterior endplate fragment superiorly, only mildly narrowing the AP diameter of the spinal canal. Remaining vertebral body heights maintained. Multilevel disc space narrowing and endplate spur formation. No additional fracture or subluxation. Atherosclerotic calcification aorta and iliac arteries. Cyst at medial aspect inferior pole LEFT kidney 2.5 x 2.2 cm image 63. T11-T12:  Minimal disc bulge. T12-L1: Minimal retropulsion of superior endplate fragment of L1 as previously noted. Foramina patent. No disc herniation. L1-L2:  Minimal disc bulge.  Foramina patent. L2-L3: Disc space narrowing and endplate spur formation. Bulging disc. Facet hypertrophy and ligamentum flavum thickening. Mild AP narrowing of the spinal canal. No disk herniation. Foramina patent. No definite neural compression. L3-L4: Disc bulge and endplate spur formation combine with facet hypertrophy and ligamentum flavum thickening to create mild AP narrowing of spinal canal. Bony foramina patent. No definite disc herniation or neural compression. L4-L5: Moderate to severe central acquired spinal stenosis, multifactorial due to endplate spur formation, disc bulge, facet hypertrophy and ligamentum flavum thickening. L4 roots exit without compression. Probable BILATERAL lateral recess  stenosis, question mass effect upon BILATERAL L5 roots. No definite focal disc herniation. L5-S1: Multifactorial central acquired spinal stenosis moderate to severe in degree due to disc bulge, ligamentum flavum thickening, minimal endplate spurring, and facet hypertrophy. BILATERAL lateral recess stenosis with compression of the S1 roots bilaterally. Question far LEFT lateral disc herniation with mass effect upon the LEFT L5 root. IMPRESSION: Multilevel degenerative disc disease changes of the lumbar spine as above with central acquired spinal stenosis at L4-L5 and L5-S1, less L3-L4. BILATERAL lateral recess stenosis at L5-S1 with mass effect upon the S1 roots. BILATERAL lateral recess stenosis at L4-L5 with questionable mass effect upon the L5 roots. Far LEFT lateral disc herniation at L5-S1 with mass effect upon LEFT L5 root. Increased compression fracture of L1 vertebral body since previous exam with mild retropulsion of a posterior endplate fragment, mildly narrowing the AP diameter of the spinal canal. Aortic atherosclerosis. Electronically Signed   By: Ulyses Southward M.D.   On: 10/06/2015 13:24      IMPRESSION AND PLAN:   Lumbar spine L1 fracture with worsening back pain. The patient will be placed  for observation. Pain control.  Dehydration. Start normal saline IV and follow-up BMP.  Hypertension. Continue hypertension medication. Diabetes. Start sliding scale.  All the records are reviewed and case discussed with ED provider. Management plans discussed with the patient, family and they are in agreement.  CODE STATUS: DNR  TOTAL TIME TAKING CARE OF THIS PATIENT: 46 minutes.    Shaune Pollackhen, Yarithza Mink M.D on 10/06/2015 at 3:45 PM  Between 7am to 6pm - Pager - [phone]  After 6pm go to [email or web address] - Social research officer, governmentpassword EPAS [facility]  [facility] [place] [facility]  Office  [phone]  CC: Primary care physician; WHITE, Arlyss RepressELIZABETH BURNEY, NP   Note: This dictation was prepared with Dragon  dictation along with smaller phrase technology. Any transcriptional errors that result from this process are unintentional.

## 2015-10-06 NOTE — ED Provider Notes (Signed)
[facility] Emergency Department Provider Note  ____________________________________________  Time seen: Approximately 11:36 AM  I have reviewed the triage vital signs and the nursing notes.   HISTORY  Chief Complaint Urinary Retention   HPI Nicolas Morris is a 82 y.o. male with a history of atrial fibrillation, AICD, diabetes, GI bleed, hypertension, cardiomyopathy with EF of 50-55%, CAD, dementia, and multiple falls and presents for evaluation of low back pain. Patient was seen here one week ago after a fall and was found to have an acute L1 vertebral fracture. He reports that he has a history of chronic back pain for 2 years but since that fall the pain as been progressively worse. He reports that since yesterday he hasn't been able to walk as both his legs feel numb and weak. He reports that he was able to walk until yesterday with his walker.  According to nursing in the facility patient has had decreased urine output and patient reports that he last urinated yesterday evening. Patient reports that his pain is moderate at rest and severe with any movement, located in the lower back midline, nonradiating, associated with numbness of both of his legs. He denies saddle anesthesia. Patient is incontinent of urine at baseline. He denies bowel retention or incontinence. Review of SNF medication chart shows that patient has been on ibuprofen only for pain. His norco and tramadol were dc on 8/10 and patient is getting only motrin 400mg  BID.  Past Medical History:  Diagnosis Date  . A-fib (HCC)    a. Permanent, on Xarelto.  Marland Kitchen. AICD (automatic cardioverter/defibrillator) present   . Biventricular ICD (implantable cardioverter-defibrillator) -Medtronic 01/12/2013   a. 01/2012 [facility]:  01/2012 s/p MDT DTBB1D1 Viva S CRT-D.  Atrial port capped (afib).  . Diabetes mellitus without complication (HCC)   . Diverticulosis    a. 12/2012 noted on colonoscopy.  . Gastritis    a. 12/2012 noted on EGD.  . H/O: GI bleed    a. 12/2012 EGD: HH, evidence of reflux. Erosive Gastritis. Nl duodenum;  b. 12/2012 Colonoscopy: Sev sigmoid and desc colon diverticulosis, transverse and ascending colon diverticulosis, nonbleeding internal hemorrhoids, no bleeding.  . Hiatal hernia    a. 12/2012 noted on EGD.  Marland Kitchen. Hypertension   . Iron deficiency anemia   . NICM (nonischemic cardiomyopathy) (HCC)    a. EF reportedly < 30% 01/2013 ([facility]);  b. 01/2012 s/p MDT DTBB1D1 Viva S CRT-D;  c. 12/2012 Echo: EF 45-50%, inf/post HK, nl RV, mild MR.  . Non-obstructive CAD    a. s/p multiple caths @ [facility] - last reportedly 06/2012;  b. 06/2013 Lexi MV: EF 51%, no ischemia/infarct.  . Presence of permanent cardiac pacemaker     Patient Active Problem List   Diagnosis Date Noted  . Fall   . Frequent falls 08/22/2015  . Overdose 06/22/2015  . Dementia, senile with depression 06/22/2015  . Iron deficiency anemia 10/24/2014  . GIB (gastrointestinal bleeding) 08/30/2014  . Hyponatremia 08/30/2014  . Elevated troponin 08/30/2014  . External hemorrhoids 08/30/2014  . Diverticulosis of colon without hemorrhage 08/30/2014  . Gastritis and gastroduodenitis 08/30/2014  . Acute posthemorrhagic anemia 08/25/2014  . Unsteady gait 03/04/2014  . NICM (nonischemic cardiomyopathy) (HCC) 07/20/2013  . Diabetes mellitus without complication (HCC)   . Diverticulosis   . Hiatal hernia   . Non-obstructive CAD   . Neck pain 04/21/2013  . Depression 01/26/2013  . Atrial fibrillation-permanent 01/12/2013  . Biventricular ICD (implantable cardioverter-defibrillator) -Medtronic 01/12/2013  .  Congestive dilated cardiomyopathy (HCC) 01/12/2013  . GI bleed 01/12/2013  . Chronic combined systolic and diastolic CHF (congestive heart failure) (HCC) 01/12/2013  . Biventricular ICD (implantable cardioverter-defibrillator) -Medtronic 01/12/2013    Past Surgical History:  Procedure Laterality Date  . CARDIAC  CATHETERIZATION  2013   [facility]   . CARDIAC CATHETERIZATION  2011  . CARDIAC DEFIBRILLATOR PLACEMENT    . COLONOSCOPY  32014  . COLONOSCOPY WITH PROPOFOL N/A 08/29/2014   Procedure: COLONOSCOPY WITH PROPOFOL;  Surgeon: Elnita Maxwell, MD;  Location: [facility] ENDOSCOPY;  Service: Endoscopy;  Laterality: N/A;  . Defib implant  01/27/2012   Serial # [id number] H Model # [id number]  . ESOPHAGOGASTRODUODENOSCOPY (EGD) WITH PROPOFOL N/A 08/29/2014   Procedure: ESOPHAGOGASTRODUODENOSCOPY (EGD) WITH PROPOFOL;  Surgeon: Elnita Maxwell, MD;  Location: [facility] ENDOSCOPY;  Service: Endoscopy;  Laterality: N/A;  . FETAL BLOOD TRANSFUSION  2014  . PACEMAKER INSERTION  01/27/2012  . UPPER GASTROINTESTINAL ENDOSCOPY  2014    Prior to Admission medications   Medication Sig Start Date End Date Taking? Authorizing Provider  aspirin 81 MG chewable tablet Chew by mouth daily.    Historical Provider, MD  atorvastatin (LIPITOR) 80 MG tablet Take 80 mg by mouth daily.    Historical Provider, MD  buPROPion (WELLBUTRIN XL) 300 MG 24 hr tablet Take 300 mg by mouth daily.    Historical Provider, MD  carvedilol (COREG) 3.125 MG tablet Take 3.125 mg by mouth 2 (two) times daily.     Historical Provider, MD  digoxin (LANOXIN) 0.25 MG tablet Take 0.25 mg by mouth daily. 01/26/13   Duke Salvia, MD  finasteride (PROSCAR) 5 MG tablet Take 5 mg by mouth daily.    Historical Provider, MD  furosemide (LASIX) 20 MG tablet Take 20 mg by mouth daily.    Historical Provider, MD  gabapentin (NEURONTIN) 600 MG tablet Take 600 mg by mouth 3 (three) times daily.    Historical Provider, MD  HYDROcodone-acetaminophen (NORCO) 5-325 MG tablet Take 1 tablet by mouth every 6 (six) hours as needed for moderate pain. 09/28/15   Irean Hong, MD  lidocaine (LIDODERM) 5 % Place 1 patch onto the skin daily. 09/28/15   Jennye Moccasin, MD  lisinopril (PRINIVIL,ZESTRIL) 2.5 MG tablet Take 2.5 mg by mouth daily.    Historical Provider, MD  Melatonin 1 MG  TABS Take 1 mg by mouth at bedtime.    Historical Provider, MD  pantoprazole (PROTONIX) 40 MG tablet Take 40 mg by mouth daily.     Historical Provider, MD  potassium chloride SA (K-DUR,KLOR-CON) 20 MEQ tablet Take 20 mEq by mouth daily.  02/17/15   Historical Provider, MD  sertraline (ZOLOFT) 100 MG tablet Take 100 mg by mouth daily.    Historical Provider, MD  tamsulosin (FLOMAX) 0.4 MG CAPS capsule Take 0.4 mg by mouth daily. Take 30 minutes after a meal    Historical Provider, MD    Allergies Review of patient's allergies indicates no known allergies.  Family History  Problem Relation Age of Onset  . Family history unknown: Yes    Social History Social History  Substance Use Topics  . Smoking status: Former Smoker    Packs/day: 3.00    Years: 45.00    Types: Cigarettes    Quit date: 01/13/2003  . Smokeless tobacco: Never Used  . Alcohol use No    Review of Systems  Constitutional: Negative for fever. Eyes: Negative for visual changes. ENT: Negative for sore throat. Cardiovascular: Negative  for chest pain. Respiratory: Negative for shortness of breath. Gastrointestinal: Negative for abdominal pain, vomiting or diarrhea. Genitourinary: Negative for dysuria. Musculoskeletal: + back pain and b/l LE weakness Skin: Negative for rash. Neurological: Negative for headaches, weakness or numbness.  ____________________________________________   PHYSICAL EXAM:  VITAL SIGNS: ED Triage Vitals  Enc Vitals Group     BP 10/06/15 1118 110/61     Pulse Rate 10/06/15 1118 66     Resp 10/06/15 1118 18     Temp --      Temp src --      SpO2 10/06/15 1118 98 %     Weight 10/06/15 1118 233 lb (105.7 kg)     Height 10/06/15 1118 6\' 2"  (1.88 m)     Head Circumference --      Peak Flow --      Pain Score 10/06/15 1119 8     Pain Loc --      Pain Edu? --      Excl. in GC? --     Constitutional: Alert and oriented. Well appearing and in no apparent distress. HEENT:      Head:  Normocephalic and atraumatic.         Eyes: Conjunctivae are normal. Sclera is non-icteric. EOMI. PERRL      Mouth/Throat: Mucous membranes are moist.       Neck: Supple with no signs of meningismus. Cardiovascular: Regular rate and rhythm. No murmurs, gallops, or rubs. 2+ symmetrical distal pulses are present in all extremities. No JVD. Respiratory: Normal respiratory effort. Lungs are clear to auscultation bilaterally. No wheezes, crackles, or rhonchi.  Gastrointestinal: Soft, non tender, and non distended with positive bowel sounds. No rebound or guarding. Genitourinary: No CVA tenderness. Musculoskeletal: ttp over the upper lumbar vertebra at midline with no stepoffs. Neurologic: Normal speech and language. Face is symmetric. Strength 4/5 on b/l hip muscles, 2+ patella reflex on the R and 1+ on the left, absent Achiles reflex on b/l LE, 5/5 dorsiflexion and plantarflexion, normal sensation, normal rectal tone and sensation. PVR 200cc Skin: Skin is warm, dry and intact. No rash noted. Psychiatric: Mood and affect are normal. Speech and behavior are normal.  ____________________________________________   LABS (all labs ordered are listed, but only abnormal results are displayed)  Labs Reviewed  CBC WITH DIFFERENTIAL/PLATELET - Abnormal; Notable for the following:       Result Value   RDW 16.9 (*)    All other components within normal limits  BASIC METABOLIC PANEL - Abnormal; Notable for the following:    Glucose, Bld 137 (*)    BUN 30 (*)    All other components within normal limits  URINALYSIS COMPLETEWITH MICROSCOPIC ([facility] ONLY) - Abnormal; Notable for the following:    Color, Urine AMBER (*)    APPearance CLEAR (*)    Ketones, ur TRACE (*)    Specific Gravity, Urine 1.033 (*)    Protein, ur 100 (*)    Squamous Epithelial / LPF 0-5 (*)    All other components within normal limits  URINE CULTURE   ____________________________________________  EKG  ED ECG REPORT I,  Nita Sickle, the attending physician, personally viewed and interpreted this ECG.  Paced rhythm, rate of 75, normal intervals, right axis deviation, no ST elevation. ____________________________________________  RADIOLOGY  CT lumbar:  Multilevel degenerative disc disease changes of the lumbar spine as above with central acquired spinal stenosis at L4-L5 and L5-S1, less L3-L4.  BILATERAL lateral recess stenosis at L5-S1 with mass  effect upon the S1 roots.  BILATERAL lateral recess stenosis at L4-L5 with questionable mass effect upon the L5 roots.  Far LEFT lateral disc herniation at L5-S1 with mass effect upon LEFT L5 root.  Increased compression fracture of L1 vertebral body since previous exam with mild retropulsion of a posterior endplate fragment, mildly narrowing the AP diameter of the spinal canal.  Aortic atherosclerosis. ____________________________________________   PROCEDURES  Procedure(s) performed: None Procedures Critical Care performed:  None ____________________________________________   INITIAL IMPRESSION / ASSESSMENT AND PLAN / ED COURSE  82 y.o. male with a history of multiple falls c/b L1 fracture on 09/28/15, atrial fibrillation, AICD, diabetes, GI bleed, hypertension, cardiomyopathy with EF of 50-55%, CAD, dementia who presents for evaluation of worsening low back pain and b.l LE weakness and numbness for 24 hours. On exam patient is tender to palpation on the upper lumbar spine in midline, he has intact strength of dorsiflexion and plantar flexion but has 4/5 strength on his hip muscles, PVR of 200 cc, normal rectal tone, reflexes are slightly diminished on the left compared to the right however present patellar reflexes bilaterally. We'll treat his pain with morphine and pursue an MRI to rule out protrusion of the disc into the spinal canal and cauda equina.  Clinical Course   _________________________ 1:42 PM on  10/06/2015 -----------------------------------------  Spoke with Radiology here and at [facility] and based on patient's AICD he is not a candidate for MRI. CT lumbar was performed with many acute findings as listed below.  CT: Multilevel degenerative disc disease changes of the lumbar spine as above with central acquired spinal stenosis at L4-L5 and L5-S1, less L3-L4.  BILATERAL lateral recess stenosis at L5-S1 with mass effect upon the S1 roots.  BILATERAL lateral recess stenosis at L4-L5 with questionable mass effect upon the L5 roots.  Far LEFT lateral disc herniation at L5-S1 with mass effect upon LEFT L5 root.  Increased compression fracture of L1 vertebral body since previous exam with mild retropulsion of a posterior endplate fragment, mildly narrowing the AP diameter of the spinal canal.  Aortic atherosclerosis.   Patient reports that pain is markedly improved after 2mg  of IV morphine and decadron. Has improved strength in b/l LE, DTR's remain unchanged. UA and labs WNL. I paged Dr. Jordan Likes, NSG at [facility] for consult. Will attempt to ambulate patient.   _________________________ 1:48 PM on 10/06/2015 -----------------------------------------  Patient able to stand with walker but unable to ambulate due to pain. Reports that the numbness on his legs has resolved. Will give more IV morphine.  _________________________ 1:52 PM on 10/06/2015 -----------------------------------------  Spoke with Dr. Jordan Likes from [facility] neurosurgery who reviewed the images. Dr. Jordan Likes says patient does not need any neurosurgical evaluation or follow-up. He reports that the worsening compression is expected in its the normal course of this injury. Also reports that all the other findings are just radiological findings and really no acute intervention is needed at this time.  _________________________ 2:38 PM on 10/06/2015 ----------------------------------------- Patient continues to be unable to  walk due to pain. Pain is well controlled at rest but markedly worse when he tries to stand up. Neuro exam remains unchanged. Discussed with Hospitalist for admission for pain control.  Pertinent labs & imaging results that were available during my care of the patient were reviewed by me and considered in my medical decision making (see chart for details).    ____________________________________________   FINAL CLINICAL IMPRESSION(S) / ED DIAGNOSES  Final diagnoses:  Back pain  Midline low back pain without sciatica      NEW MEDICATIONS STARTED DURING THIS VISIT:  New Prescriptions   No medications on file     Note:  This document was prepared using Dragon voice recognition software and may include unintentional dictation errors.    Nita Sickle, MD 10/06/15 [phone]

## 2015-10-06 NOTE — ED Notes (Signed)
Pt offered meal and drink.Pt denied.

## 2015-10-06 NOTE — Progress Notes (Signed)
Pt arrived on the unit via stretcher from ER at 1700. Pt is alert to self, is trasferred from stretcher to the bed by staff, pt tolerated well. Skin check completed with Dava,RN. Pt noted to have a small scab on each knee bilat and to l elbow, all appear to be healing and older. Foam placed to sacrum. Pt is on room air, has no teeth, speech is able to be understood. Lungs are clear bilat, pt is noted to have paced to his l chest wall, hr is regular. Abdomen is soft, bs heard. Pt had been incontinent of a small amount ofu rine, placedin a fresh brief.ppp, no edema noted. PIV#22 intact to R fa, site is free of redness and swelling. Since arrival, pt has attempted to feed himself dinner, remains confused and orientedot self only. Pt stated that he is not in a lot of pain, feels better since arrival. This writer found that pt had removed his ic from his R arm, left it with catheter intact on top of his dinner tray, pt unable to account for why he pulled this out or when, did not remember doing it. Bed alarm is on due to impulsive behavior, and per report from Er, pt is unable to stand. Srx2. Report given to [facility].

## 2015-10-06 NOTE — ED Notes (Signed)
The patient was helped up from the bed and placed in front of a walker. He was able to stand but could not walk due to the pain in his back increasing when he stood up. The patient was placed back into his bed and is now comfortable.

## 2015-10-06 NOTE — ED Triage Notes (Signed)
Pt to ed with c/o back pain and bladder pain.  Pt caregiver states pt has not voided in 2 days.

## 2015-10-07 DIAGNOSIS — M4856XA Collapsed vertebra, not elsewhere classified, lumbar region, initial encounter for fracture: Secondary | ICD-10-CM | POA: Diagnosis not present

## 2015-10-07 LAB — MRSA PCR SCREENING: MRSA BY PCR: NEGATIVE

## 2015-10-07 LAB — URINE CULTURE: CULTURE: NO GROWTH

## 2015-10-07 LAB — CBC
HCT: 41.6 % (ref 40.0–52.0)
HEMOGLOBIN: 14 g/dL (ref 13.0–18.0)
MCH: 27 pg (ref 26.0–34.0)
MCHC: 33.6 g/dL (ref 32.0–36.0)
MCV: 80.3 fL (ref 80.0–100.0)
PLATELETS: 149 10*3/uL — AB (ref 150–440)
RBC: 5.18 MIL/uL (ref 4.40–5.90)
RDW: 17.1 % — AB (ref 11.5–14.5)
WBC: 5.9 10*3/uL (ref 3.8–10.6)

## 2015-10-07 LAB — BASIC METABOLIC PANEL
ANION GAP: 8 (ref 5–15)
BUN: 22 mg/dL — AB (ref 6–20)
CALCIUM: 8.7 mg/dL — AB (ref 8.9–10.3)
CO2: 24 mmol/L (ref 22–32)
CREATININE: 0.56 mg/dL — AB (ref 0.61–1.24)
Chloride: 105 mmol/L (ref 101–111)
GFR calc Af Amer: >60 mL/min (ref >60)
GLUCOSE: 75 mg/dL (ref 65–99)
Potassium: 3.8 mmol/L (ref 3.5–5.1)
Sodium: 137 mmol/L (ref 135–145)

## 2015-10-07 LAB — GLUCOSE, CAPILLARY
GLUCOSE-CAPILLARY: 77 mg/dL (ref 65–99)
GLUCOSE-CAPILLARY: 187 mg/dL — AB (ref 65–99)
Glucose-Capillary: 147 mg/dL — ABNORMAL HIGH (ref 65–99)
Glucose-Capillary: 106 mg/dL — ABNORMAL HIGH (ref 65–99)

## 2015-10-07 MED ORDER — QUETIAPINE FUMARATE 25 MG PO TABS
12.5000 mg | ORAL_TABLET | Freq: Every day | ORAL | Status: DC
  Administered 2015-10-07: 12.5 mg via ORAL
  Filled 2015-10-07: qty 1

## 2015-10-07 NOTE — Clinical Social Work Note (Signed)
Christiane Ha of [facility] returned call to this CSW. Patient can be accepted to [facility] for LTC Medicaid upon d/c. Christiane Ha also said he will accept a pending PASRR; CSW began process.  Argentina Ponder, MSW, LCSW-A [phone]

## 2015-10-07 NOTE — Progress Notes (Signed)
[facility] PROGRESS NOTE  Nicolas Morris NIO:[id number] DOB: March 23, 1933 DOA: 10/06/2015 PCP: WHITE, Arlyss Repress, NP  HPI/Subjective: When I saw the patient today did not have any back pain and was able to move his legs up and down with straight leg raising without a problem. He states his problem is when he tries to get up.  Objective: Vitals:   10/06/15 2100 10/07/15 0444  BP: 130/66 125/71  Pulse: 80 74  Resp: 18 16  Temp: 97.8 F (36.6 C) 97.4 F (36.3 C)    Filed Weights   10/06/15 1118 10/06/15 1700  Weight: 105.7 kg (233 lb) 83.1 kg (183 lb 1.6 oz)    ROS: Review of Systems  Constitutional: Negative for chills and fever.  Eyes: Negative for blurred vision.  Respiratory: Negative for cough and shortness of breath.   Cardiovascular: Negative for chest pain.  Gastrointestinal: Negative for abdominal pain, constipation, diarrhea, nausea and vomiting.  Genitourinary: Negative for dysuria.  Musculoskeletal: Positive for back pain. Negative for joint pain.  Neurological: Negative for dizziness and headaches.   Exam: Physical Exam  HENT:  Nose: No mucosal edema.  Mouth/Throat: No oropharyngeal exudate or posterior oropharyngeal edema.  Eyes: Conjunctivae, EOM and lids are normal. Pupils are equal, round, and reactive to light.  Neck: No JVD present. Carotid bruit is not present. No edema present. No thyroid mass and no thyromegaly present.  Cardiovascular: S1 normal and S2 normal.  Exam reveals no gallop.   No murmur heard. Pulses:      Dorsalis pedis pulses are 2+ on the right side, and 2+ on the left side.  Respiratory: No respiratory distress. He has no wheezes. He has no rhonchi. He has no rales.  GI: Soft. Bowel sounds are normal. There is no tenderness.  Musculoskeletal:       Right ankle: He exhibits no swelling.       Left ankle: He exhibits no swelling.  Lymphadenopathy:    He has no cervical adenopathy.  Neurological: He is alert. No cranial nerve  deficit.  Skin: Skin is warm. No rash noted. Nails show no clubbing.  Psychiatric: He has a normal mood and affect.      Data Reviewed: Basic Metabolic Panel:  Recent Labs Lab 10/06/15 1149 10/07/15 0425  NA 138 137  K 4.1 3.8  CL 103 105  CO2 27 24  GLUCOSE 137* 75  BUN 30* 22*  CREATININE 1.00 0.56*  CALCIUM 9.6 8.7*   CBC:  Recent Labs Lab 10/06/15 1149 10/07/15 0425  WBC 7.2 5.9  NEUTROABS 5.4  --   HGB 15.7 14.0  HCT 46.2 41.6  MCV 80.8 80.3  PLT 166 149*    CBG:  Recent Labs Lab 10/06/15 2111 10/07/15 0735 10/07/15 1132  GLUCAP 137* 77 147*    Recent Results (from the past 240 hour(s))  Urine culture     Status: None   Collection Time: 10/06/15 11:49 AM  Result Value Ref Range Status   Specimen Description URINE, RANDOM  Final   Special Requests NONE  Final   Culture NO GROWTH Performed at [facility]   Final   Report Status 10/07/2015 FINAL  Final  MRSA PCR Screening     Status: Abnormal   Collection Time: 10/06/15  5:10 PM  Result Value Ref Range Status   MRSA by PCR (A) NEGATIVE Final    INVALID, UNABLE TO DETERMINE THE PRESENCE OF TARGET DNA DUE TO SPECIMEN INTEGRITY. RECOLLECTION REQUESTED.    Comment:  The GeneXpert MRSA Assay (FDA approved for NASAL specimens only), is one component of a comprehensive MRSA colonization surveillance program. It is not intended to diagnose MRSA infection nor to guide or monitor treatment for MRSA infections.   MRSA PCR Screening     Status: None   Collection Time: 10/06/15  8:35 PM  Result Value Ref Range Status   MRSA by PCR NEGATIVE NEGATIVE Final    Comment:        The GeneXpert MRSA Assay (FDA approved for NASAL specimens only), is one component of a comprehensive MRSA colonization surveillance program. It is not intended to diagnose MRSA infection nor to guide or monitor treatment for MRSA infections.      Studies: Ct Lumbar Spine Wo Contrast  Result Date:  10/06/2015 CLINICAL DATA:  Back and bladder pain, numerous falls in past 2 months, low back pain, question fracture or spinal stenosis EXAM: CT LUMBAR SPINE WITHOUT CONTRAST TECHNIQUE: Multidetector CT imaging of the lumbar spine was performed without intravenous contrast administration. Multiplanar CT image reconstructions were also generated. COMPARISON:  CT lumbar spine 09/28/2015 FINDINGS: Prior exam labeled with 5 lumbar vertebra, current exam labeled accordingly. Marked osseous demineralization. Compression fracture L1 vertebral body with slightly increased central height loss and increased vacuum phenomenon versus gas within vertebral body. Mild retropulsion of a posterior endplate fragment superiorly, only mildly narrowing the AP diameter of the spinal canal. Remaining vertebral body heights maintained. Multilevel disc space narrowing and endplate spur formation. No additional fracture or subluxation. Atherosclerotic calcification aorta and iliac arteries. Cyst at medial aspect inferior pole LEFT kidney 2.5 x 2.2 cm image 63. T11-T12:  Minimal disc bulge. T12-L1: Minimal retropulsion of superior endplate fragment of L1 as previously noted. Foramina patent. No disc herniation. L1-L2:  Minimal disc bulge.  Foramina patent. L2-L3: Disc space narrowing and endplate spur formation. Bulging disc. Facet hypertrophy and ligamentum flavum thickening. Mild AP narrowing of the spinal canal. No disk herniation. Foramina patent. No definite neural compression. L3-L4: Disc bulge and endplate spur formation combine with facet hypertrophy and ligamentum flavum thickening to create mild AP narrowing of spinal canal. Bony foramina patent. No definite disc herniation or neural compression. L4-L5: Moderate to severe central acquired spinal stenosis, multifactorial due to endplate spur formation, disc bulge, facet hypertrophy and ligamentum flavum thickening. L4 roots exit without compression. Probable BILATERAL lateral recess  stenosis, question mass effect upon BILATERAL L5 roots. No definite focal disc herniation. L5-S1: Multifactorial central acquired spinal stenosis moderate to severe in degree due to disc bulge, ligamentum flavum thickening, minimal endplate spurring, and facet hypertrophy. BILATERAL lateral recess stenosis with compression of the S1 roots bilaterally. Question far LEFT lateral disc herniation with mass effect upon the LEFT L5 root. IMPRESSION: Multilevel degenerative disc disease changes of the lumbar spine as above with central acquired spinal stenosis at L4-L5 and L5-S1, less L3-L4. BILATERAL lateral recess stenosis at L5-S1 with mass effect upon the S1 roots. BILATERAL lateral recess stenosis at L4-L5 with questionable mass effect upon the L5 roots. Far LEFT lateral disc herniation at L5-S1 with mass effect upon LEFT L5 root. Increased compression fracture of L1 vertebral body since previous exam with mild retropulsion of a posterior endplate fragment, mildly narrowing the AP diameter of the spinal canal. Aortic atherosclerosis. Electronically Signed   By: Ulyses SouthwardMark  Boles M.D.   On: 10/06/2015 13:24    Scheduled Meds: . aspirin  81 mg Oral Daily  . atorvastatin  80 mg Oral Daily  . buPROPion  300  mg Oral Daily  . carvedilol  3.125 mg Oral BID  . digoxin  0.25 mg Oral Daily  . docusate sodium  100 mg Oral BID  . enoxaparin (LOVENOX) injection  40 mg Subcutaneous Q24H  . finasteride  5 mg Oral Daily  . furosemide  20 mg Oral Daily  . gabapentin  600 mg Oral TID  . insulin aspart  0-5 Units Subcutaneous QHS  . insulin aspart  0-9 Units Subcutaneous TID WC  . lisinopril  2.5 mg Oral Daily  . Melatonin  2.5 mg Oral QHS  . pantoprazole  40 mg Oral Daily  . potassium chloride SA  20 mEq Oral Daily  . QUEtiapine  12.5 mg Oral QHS  . sertraline  100 mg Oral Daily  . tamsulosin  0.4 mg Oral QPC supper    Assessment/Plan:  1. Back pain, compression fracture, spinal stenosis. Patient given 1 dose of  steroid. I'm hesitant on continuing this because he pulled out 3 IVs already. Not complaining of back pain currently. When necessary tramadol. Physical therapy recommended rehabilitation. 2. History of dementia give Seroquel at night 3. BPH on Flomax and finasteride 4. Type 2 diabetes mellitus on sliding scale 5. Essential hypertension continue lisinopril  Code Status:     Code Status Orders        Start     Ordered   10/06/15 1701  Do not attempt resuscitation (DNR)  Continuous    Question Answer Comment  In the event of cardiac or respiratory ARREST Do not call a "code blue"   In the event of cardiac or respiratory ARREST Do not perform Intubation, CPR, defibrillation or ACLS   In the event of cardiac or respiratory ARREST Use medication by any route, position, wound care, and other measures to relive pain and suffering. May use oxygen, suction and manual treatment of airway obstruction as needed for comfort.      10/06/15 1700    Code Status History    Date Active Date Inactive Code Status Order ID Comments User Context   08/23/2015  2:00 AM 08/24/2015  3:49 PM DNR [id number]  Oralia Manis, MD Inpatient   08/25/2014 11:33 PM 08/30/2014  3:25 PM DNR [id number]  Oralia Manis, MD Inpatient   08/25/2014 10:30 PM 08/25/2014 11:33 PM Full Code [id number]  Katharina Caper, MD Inpatient   08/25/2014  8:32 PM 08/25/2014 10:30 PM DNR [id number]  Katharina Caper, MD ED    Advance Directive Documentation   Flowsheet Row Most Recent Value  Type of Advance Directive  Out of facility DNR (pink MOST or yellow form)  Pre-existing out of facility DNR order (yellow form or pink MOST form)  No data  "MOST" Form in Place?  No data     Family Communication: Wife on the phone Disposition Plan: Rehabilitation likely Monday  Time spent: 25 minutes  Alford Highland  [employer]

## 2015-10-07 NOTE — Evaluation (Signed)
Physical Therapy Evaluation Patient Details Name: Nicolas Morris MRN: [id number] DOB: 04/19/1933 Today's Date: 10/07/2015   History of Present Illness  Nicolas Morris is a 82yo white male who comes to [facility] on 8/18 after continued LBP s/p multiple falls: noted to have L1 compression fracture 1WA at previous admission. Pt was here 1 month ago also for falls.   Clinical Impression  Pt presenting with moderate to severe weakness and deconditioning. Pt is motivated and willing to participate, but requires moderate assistance with transfers, max-total assistance for bed mobility, and is unable to come to full stance nor maintain partial stance due to severe weakness. The patient is unable to remain sitting at EOB without heavy physical assistance. He has been to the ED 11 times in the past 6 months, and he was able to AMB 255ft with PT at eval, but today is unable to maintain standing. Pt will need extensive rehabilitation to address these impairments.   Patient presenting with impairment of strength, range of motion, balance, and activity tolerance, limiting ability to perform ADL and mobility tasks at  baseline level of function. Patient will benefit from skilled intervention to address the above impairments and limitations, in order to restore to prior level of function, improve patient safety upon discharge, and to decrease falls risk.       Follow Up Recommendations SNF;Supervision for mobility/OOB    Equipment Recommendations       Recommendations for Other Services       Precautions / Restrictions Precautions Precautions: Fall Restrictions Weight Bearing Restrictions: No      Mobility  Bed Mobility Overal bed mobility: +2 for physical assistance;Needs Assistance Bed Mobility: Supine to Sit;Sit to Supine     Supine to sit: Max assist;+2 for physical assistance Sit to supine: Mod assist   General bed mobility comments: severe core weakness, unable to remain sitting upright  without BUE support.   Transfers Overall transfer level: Needs assistance Equipment used: Rolling walker (2 wheeled) Transfers: Sit to/from Stand Sit to Stand: Max assist         General transfer comment: Unable to achieve full upright stance, knees remain flexed.   Ambulation/Gait             General Gait Details: Unable to maintain sitting upright or standing  Stairs            Wheelchair Mobility    Modified Rankin (Stroke Patients Only)       Balance Overall balance assessment: History of Falls                                           Pertinent Vitals/Pain Pain Assessment:  (unable to rate pain due to lethargy/AMS related to dementia. )    Home Living Family/patient expects to be discharged to:: Assisted living               Home Equipment: Walker - 4 wheels;Cane - single point      Prior Function Level of Independence: Independent with assistive device(s);Needs assistance         Comments: Taken from EMR: patient uses SPC or rollator, occasionally furniture cruising depending on situation. He recevies assistance for bathing, he states he will wake up at night and go to the kitchen.      Hand Dominance        Extremity/Trunk Assessment   Upper Extremity  Assessment: Generalized weakness           Lower Extremity Assessment: Generalized weakness         Communication   Communication: Expressive difficulties (moderate slurring of speech. )  Cognition Arousal/Alertness: Awake/alert;Lethargic   Overall Cognitive Status: Difficult to assess                      General Comments      Exercises        Assessment/Plan    PT Assessment Patient needs continued PT services  PT Diagnosis Difficulty walking;Generalized weakness;Altered mental status;Abnormality of gait   PT Problem List Decreased strength;Decreased coordination;Decreased range of motion;Decreased cognition;Decreased activity  tolerance;Decreased balance;Decreased safety awareness;Decreased mobility;Decreased knowledge of precautions;Pain  PT Treatment Interventions Functional mobility training;Therapeutic activities;Therapeutic exercise;Balance training;Gait training   PT Goals (Current goals can be found in the Care Plan section) Acute Rehab PT Goals PT Goal Formulation: Patient unable to participate in goal setting    Frequency Min 2X/week   Barriers to discharge Decreased caregiver support      Co-evaluation               End of Session Equipment Utilized During Treatment: Gait belt Activity Tolerance: Patient limited by fatigue;Patient limited by lethargy;Patient limited by pain Patient left: in bed;with bed alarm set Nurse Communication: Other (comment)    Functional Assessment Tool Used: Clinical Judgment  Functional Limitation: Changing and maintaining body position Changing and Maintaining Body Position Current Status (Z6109(G8981): At least 80 percent but less than 100 percent impaired, limited or restricted Changing and Maintaining Body Position Goal Status (U0454(G8982): At least 80 percent but less than 100 percent impaired, limited or restricted    Time: 1249-1257 PT Time Calculation (min) (ACUTE ONLY): 8 min   Charges:   PT Evaluation $PT Eval Moderate Complexity: 1 Procedure     PT G Codes:   PT G-Codes **NOT FOR INPATIENT CLASS** Functional Assessment Tool Used: Clinical Judgment  Functional Limitation: Changing and maintaining body position Changing and Maintaining Body Position Current Status (U9811(G8981): At least 80 percent but less than 100 percent impaired, limited or restricted Changing and Maintaining Body Position Goal Status (B1478(G8982): At least 80 percent but less than 100 percent impaired, limited or restricted   1:20 PM, 10/07/15 Rosamaria LintsAllan C Dre Gamino, PT, DPT Physical Therapist - [place] [phone](ASCOM)  [phone] (mobile)

## 2015-10-07 NOTE — Progress Notes (Signed)
Patient has pulled out two iv's.

## 2015-10-07 NOTE — NC FL2 (Signed)
[place] MEDICAID FL2 LEVEL OF CARE SCREENING TOOL     IDENTIFICATION  Patient Name: Nicolas Morris Birthdate: 04/14/1933 Sex: male Admission Date (Current Location): 10/06/2015  [facility] [place] [place]   Facility and Address:  [facility], [address], [place], [place] [address]      Provider Number: [id number]  Attending Physician Name and Address:  Alford Highlandichard Wieting, MD  Relative Name and Phone Number:       Current Level of Care: Domiciliary (Rest home) Recommended Level of Care: Assisted Living Facility Prior Approval Number:    Date Approved/Denied:   PASRR Number:  [id number])  Discharge Plan: Domiciliary (Rest home)    Current Diagnoses: Patient Active Problem List   Diagnosis Date Noted  . Lumbar compression fracture (HCC) 10/06/2015  . Fall   . Frequent falls 08/22/2015  . Overdose 06/22/2015  . Dementia, senile with depression 06/22/2015  . Iron deficiency anemia 10/24/2014  . GIB (gastrointestinal bleeding) 08/30/2014  . Hyponatremia 08/30/2014  . Elevated troponin 08/30/2014  . External hemorrhoids 08/30/2014  . Diverticulosis of colon without hemorrhage 08/30/2014  . Gastritis and gastroduodenitis 08/30/2014  . Acute posthemorrhagic anemia 08/25/2014  . Unsteady gait 03/04/2014  . NICM (nonischemic cardiomyopathy) (HCC) 07/20/2013  . Diabetes mellitus without complication (HCC)   . Diverticulosis   . Hiatal hernia   . Non-obstructive CAD   . Neck pain 04/21/2013  . Depression 01/26/2013  . Atrial fibrillation-permanent 01/12/2013  . Biventricular ICD (implantable cardioverter-defibrillator) -[employer]tronic 01/12/2013  . Congestive dilated cardiomyopathy (HCC) 01/12/2013  . GI bleed 01/12/2013  . Chronic combined systolic and diastolic CHF (congestive heart failure) (HCC) 01/12/2013  . Biventricular ICD (implantable cardioverter-defibrillator) -Medtronic 01/12/2013    Orientation RESPIRATION BLADDER  Height & Weight     Self  Normal Incontinent Weight: 183 lb 1.6 oz (83.1 kg) Height:  6\' 2"  (188 cm)  BEHAVIORAL SYMPTOMS/MOOD NEUROLOGICAL BOWEL NUTRITION STATUS   (none)  (none) Continent Diet (Diet: Heart Healthy)  AMBULATORY STATUS COMMUNICATION OF NEEDS Skin   Supervision Verbally Normal                       Personal Care Assistance Level of Assistance  Bathing, Feeding, Dressing Bathing Assistance: Limited assistance Feeding assistance: Limited assistance Dressing Assistance: Limited assistance     Functional Limitations Info  Sight, Hearing, Speech Sight Info: Adequate Hearing Info: Adequate Speech Info: Adequate    SPECIAL CARE FACTORS FREQUENCY                       Contractures      Additional Factors Info  Code Status, Allergies, Psychotropic, Insulin Sliding Scale, Isolation Precautions Code Status Info:  (DNR ) Allergies Info:  (No Known Allergies. )   Insulin Sliding Scale Info:  (NovoLog Insulin Injections. 3 times with meals ) Isolation Precautions Info:  (History of MRSA)     Current Medications (10/07/2015):  This is the current hospital active medication list Current Facility-Administered Medications  Medication Dose Route Frequency Provider Last Rate Last Dose  . 0.9 %  sodium chloride infusion   Intravenous Continuous Shaune PollackQing Chen, MD      . acetaminophen (TYLENOL) tablet 650 mg  650 mg Oral Q6H PRN Shaune PollackQing Chen, MD       Or  . acetaminophen (TYLENOL) suppository 650 mg  650 mg Rectal Q6H PRN Shaune PollackQing Chen, MD      . albuterol (PROVENTIL) (2.5 MG/3ML) 0.083% nebulizer solution 2.5  mg  2.5 mg Nebulization Q2H PRN Shaune Pollack, MD      . aspirin chewable tablet 81 mg  81 mg Oral Daily Shaune Pollack, MD   81 mg at 10/07/15 0908  . atorvastatin (LIPITOR) tablet 80 mg  80 mg Oral Daily Shaune Pollack, MD   80 mg at 10/07/15 0908  . buPROPion (WELLBUTRIN XL) 24 hr tablet 300 mg  300 mg Oral Daily Shaune Pollack, MD   300 mg at 10/07/15 0908  . carvedilol (COREG)  tablet 3.125 mg  3.125 mg Oral BID Shaune Pollack, MD   3.125 mg at 10/07/15 0907  . digoxin (LANOXIN) tablet 0.25 mg  0.25 mg Oral Daily Shaune Pollack, MD   0.25 mg at 10/07/15 0908  . docusate sodium (COLACE) capsule 100 mg  100 mg Oral BID Shaune Pollack, MD   100 mg at 10/07/15 0907  . enoxaparin (LOVENOX) injection 40 mg  40 mg Subcutaneous Q24H Shaune Pollack, MD   40 mg at 10/06/15 2141  . finasteride (PROSCAR) tablet 5 mg  5 mg Oral Daily Shaune Pollack, MD   5 mg at 10/07/15 8127  . furosemide (LASIX) tablet 20 mg  20 mg Oral Daily Shaune Pollack, MD   20 mg at 10/07/15 0907  . gabapentin (NEURONTIN) tablet 600 mg  600 mg Oral TID Shaune Pollack, MD   600 mg at 10/07/15 0908  . HYDROcodone-acetaminophen (NORCO/VICODIN) 5-325 MG per tablet 1 tablet  1 tablet Oral Q6H PRN Shaune Pollack, MD      . insulin aspart (novoLOG) injection 0-5 Units  0-5 Units Subcutaneous QHS Shaune Pollack, MD      . insulin aspart (novoLOG) injection 0-9 Units  0-9 Units Subcutaneous TID WC Shaune Pollack, MD      . ketorolac (TORADOL) 15 MG/ML injection 15 mg  15 mg Intravenous Q6H PRN Shaune Pollack, MD      . lisinopril (PRINIVIL,ZESTRIL) tablet 2.5 mg  2.5 mg Oral Daily Shaune Pollack, MD   2.5 mg at 10/06/15 1830  . Melatonin TABS 2.5 mg  2.5 mg Oral QHS Cindi Carbon, RPH   2.5 mg at 10/06/15 2142  . ondansetron (ZOFRAN) tablet 4 mg  4 mg Oral Q6H PRN Shaune Pollack, MD       Or  . ondansetron [employer] [facility]) injection 4 mg  4 mg Intravenous Q6H PRN Shaune Pollack, MD      . pantoprazole (PROTONIX) EC tablet 40 mg  40 mg Oral Daily Shaune Pollack, MD   40 mg at 10/07/15 0908  . potassium chloride SA (K-DUR,KLOR-CON) CR tablet 20 mEq  20 mEq Oral Daily Shaune Pollack, MD   20 mEq at 10/07/15 0907  . senna-docusate (Senokot-S) tablet 1 tablet  1 tablet Oral QHS PRN Shaune Pollack, MD      . sertraline (ZOLOFT) tablet 100 mg  100 mg Oral Daily Shaune Pollack, MD   100 mg at 10/07/15 0907  . tamsulosin (FLOMAX) capsule 0.4 mg  0.4 mg Oral QPC supper Shaune Pollack, MD   0.4 mg at 10/06/15 1830  . traMADol  (ULTRAM) tablet 50 mg  50 mg Oral Q6H PRN Shaune Pollack, MD   50 mg at 10/07/15 0907     Discharge Medications: Please see discharge summary for a list of discharge medications.  Relevant Imaging Results:  Relevant Lab Results:   Additional Information  (SSN: [id number])  Updated PASRR [id number]  Sample, Darleen Crocker, LCSW

## 2015-10-07 NOTE — Clinical Social Work Note (Signed)
Clinical Social Work Assessment  Patient Details  Name: Nicolas Morris MRN: [id number] Date of Birth: [birth year]  Date of referral:  10/07/15               Reason for consult:  Facility Placement                Permission sought to share information with:  Family Supports, Magazine features editor Permission granted to share information::  Yes, Release of Information Signed  Name::     Ray Toyama  Agency::  [facility]  Relationship::  Spouse  Contact Information:  [phone]  Housing/Transportation Living arrangements for the past 2 months:  Assisted Living Facility Source of Information:  Medical Team, Facility, Spouse Patient Interpreter Needed:  None Criminal Activity/Legal Involvement Pertinent to Current Situation/Hospitalization:  No - Comment as needed Significant Relationships:  Spouse (Very limited relationship with spouse.) Lives with:  Facility Resident Do you feel safe going back to the place where you live?  Yes Need for family participation in patient care:  Yes (Comment)  Care giving concerns:  Patient admitted from facility   Social Worker assessment / plan:  Patient was not consistently alert. Assessment conducted with facility, [facility], and patient's wife, Lance Montford [phone]).  Patient at baseline is total care. Patient under observation post-fall at the facility. PT recommending SNF with Pt.  [facility] indicates that he can return to the facility upon dc.  Patient's wife unsure of direction to take due to insurance. Patient eligible for Medicaid bed, but not Medicare, for SNF. CSW informed patient's wife of Medicaid process. Darl Pikes reported having a call from [facility] indicating a bed opening for the patient but was unsure as to whether it was a Medicaid or Medicare bed. CSW contacted Christiane Ha at [facility] and left a voice mail to confirm.  If patient has eligibility at [facility], the wife would like him to go there. If  not, she would like him to return to the [facility] [place].  CSW will follow.  Employment status:  Retired Health and safety inspector:  Medicaid In [place], [place] PT Recommendations:  Skilled Nursing Facility Information / Referral to community resources:     Patient/Family's Response to care:  Family appreciated assistance.  Patient/Family's Understanding of and Emotional Response to Diagnosis, Current Treatment, and Prognosis:   Patient's wife is confused about terminology. CSW offered support and education. Patient's wife was able to verbally indicate in her own terms her decision for care.  Emotional Assessment Appearance:  Appears stated age Attitude/Demeanor/Rapport:  Inconsistent (Patient was pleasant but confused.) Affect (typically observed):  Pleasant, Accepting (Patient was confused but pleasant) Orientation:  Oriented to Self, Oriented to Place Alcohol / Substance use:  Never Used Psych involvement (Current and /or in the community):  No (Comment)  Discharge Needs  Concerns to be addressed:  Discharge Planning Concerns, Financial / Insurance Concerns Readmission within the last 30 days:  No Current discharge risk:  Physical Impairment Barriers to Discharge:  Continued Medical Work up   [employer], LCSW 10/07/2015, 4:24 PM

## 2015-10-08 DIAGNOSIS — M4856XA Collapsed vertebra, not elsewhere classified, lumbar region, initial encounter for fracture: Secondary | ICD-10-CM | POA: Diagnosis not present

## 2015-10-08 LAB — GLUCOSE, CAPILLARY: GLUCOSE-CAPILLARY: 85 mg/dL (ref 65–99)

## 2015-10-08 MED ORDER — MELATONIN 5 MG PO TABS: 2.5000 mg | ORAL_TABLET | Freq: Every day | ORAL | 0 refills | Status: AC

## 2015-10-08 MED ORDER — LIDOCAINE 5 % EX PTCH: 1.0000 | MEDICATED_PATCH | CUTANEOUS | 0 refills | Status: AC

## 2015-10-08 MED ORDER — DOCUSATE SODIUM 100 MG PO CAPS: 100.0000 mg | ORAL_CAPSULE | Freq: Two times a day (BID) | ORAL | 0 refills | Status: AC

## 2015-10-08 MED ORDER — QUETIAPINE FUMARATE 25 MG PO TABS: 12.5000 mg | ORAL_TABLET | Freq: Every day | ORAL | 0 refills | Status: AC

## 2015-10-08 MED ORDER — TRAMADOL HCL 50 MG PO TABS: 50.0000 mg | ORAL_TABLET | Freq: Four times a day (QID) | ORAL | 0 refills | Status: AC | PRN

## 2015-10-08 MED ORDER — LIDOCAINE 5 % EX PTCH
1.0000 | MEDICATED_PATCH | CUTANEOUS | Status: DC
  Administered 2015-10-08: 1 via TRANSDERMAL
  Filled 2015-10-08: qty 1

## 2015-10-08 NOTE — Discharge Summary (Signed)
[facility] - [place] at [facility]   PATIENT NAME: Nicolas Morris    MR#:  [id number]  DATE OF BIRTH:  May 13, 1933  DATE OF ADMISSION:  10/06/2015 ADMITTING PHYSICIAN: Shaune Pollack, MD  DATE OF DISCHARGE: 10/08/2015  PRIMARY CARE PHYSICIAN: WHITE, Arlyss Repress, NP    ADMISSION DIAGNOSIS:  Back pain [M54.9] Midline low back pain without sciatica [M54.5]  DISCHARGE DIAGNOSIS:  Active Problems:   Lumbar compression fracture (HCC)   SECONDARY DIAGNOSIS:   Past Medical History:  Diagnosis Date  . A-fib (HCC)    a. Permanent, on Xarelto.  Marland Kitchen AICD (automatic cardioverter/defibrillator) present   . Biventricular ICD (implantable cardioverter-defibrillator) -Medtronic 01/12/2013   a. 01/2012 [facility]:  01/2012 s/p MDT DTBB1D1 Viva S CRT-D.  Atrial port capped (afib).  . Compression fracture   . Diabetes mellitus without complication (HCC)   . Diverticulosis    a. 12/2012 noted on colonoscopy.  . Gastritis    a. 12/2012 noted on EGD.  . H/O: GI bleed    a. 12/2012 EGD: HH, evidence of reflux. Erosive Gastritis. Nl duodenum;  b. 12/2012 Colonoscopy: Sev sigmoid and desc colon diverticulosis, transverse and ascending colon diverticulosis, nonbleeding internal hemorrhoids, no bleeding.  . Hiatal hernia    a. 12/2012 noted on EGD.  Marland Kitchen Hypertension   . Iron deficiency anemia   . NICM (nonischemic cardiomyopathy) (HCC)    a. EF reportedly < 30% 01/2013 ([facility]);  b. 01/2012 s/p MDT DTBB1D1 Viva S CRT-D;  c. 12/2012 Echo: EF 45-50%, inf/post HK, nl RV, mild MR.  . Non-obstructive CAD    a. s/p multiple caths @ [facility] - last reportedly 06/2012;  b. 06/2013 [facility] MV: EF 51%, no ischemia/infarct.  . Presence of permanent cardiac pacemaker     HOSPITAL COURSE:   1. Back pain, compression fracture and spinal stenosis. Patient was given 1 dose of steroid in the emergency room and I was hesitant on continuing this because he pulled out 3 IVs during the hospital course.  Yesterday when I saw him he did not have any back pain. He did have some back pain this morning. Tramadol when necessary pain. I will add a Lidoderm patch. Physical therapy recommended rehabilitation and he will go out [facility] today. If still having more pain down the line can follow up with Dr. Rosita Kea orthopedic surgery. Consider calcium and vitamin D and Fosamax as outpatient. 2. History of dementia Seroquel trial at night. 3. BPH on Flomax and finasteride 4. Type 2 diabetes mellitus. Check fingersticks before every meal C and daily at bedtime. Fingersticks have been well controlled here. 5. Essential hypertension on lisinopril 6. History of atrial fibrillation on aspirin for anticoagulation.   DISCHARGE CONDITIONS:   Satisfactory  CONSULTS OBTAINED:   none  DRUG ALLERGIES:  No Known Allergies  DISCHARGE MEDICATIONS:   Current Discharge Medication List    START taking these medications   Details  docusate sodium (COLACE) 100 MG capsule Take 1 capsule (100 mg total) by mouth 2 (two) times daily. Qty: 60 capsule, Refills: 0    lidocaine (LIDODERM) 5 % Place 1 patch onto the skin daily. Remove & Discard patch within 12 hours or as directed by MD Qty: 30 patch, Refills: 0    QUEtiapine (SEROQUEL) 25 MG tablet Take 0.5 tablets (12.5 mg total) by mouth at bedtime. Qty: 30 tablet, Refills: 0    traMADol (ULTRAM) 50 MG tablet Take 1 tablet (50 mg total) by mouth every 6 (six) hours  as needed for moderate pain. Qty: 30 tablet, Refills: 0      CONTINUE these medications which have CHANGED   Details  Melatonin 5 MG TABS Take 0.5 tablets (2.5 mg total) by mouth at bedtime. Qty: 30 tablet, Refills: 0      CONTINUE these medications which have NOT CHANGED   Details  aspirin 325 MG tablet Take 325 mg by mouth daily.    atorvastatin (LIPITOR) 80 MG tablet Take 80 mg by mouth daily.    buPROPion (WELLBUTRIN XL) 300 MG 24 hr tablet Take 300 mg by mouth daily.     carvedilol (COREG) 3.125 MG tablet Take 3.125 mg by mouth 2 (two) times daily.     digoxin (LANOXIN) 0.25 MG tablet Take 0.25 mg by mouth daily.    finasteride (PROSCAR) 5 MG tablet Take 5 mg by mouth daily.    furosemide (LASIX) 20 MG tablet Take 20 mg by mouth daily.    gabapentin (NEURONTIN) 600 MG tablet Take 600 mg by mouth 3 (three) times daily.    HYDROcodone-acetaminophen (NORCO) 5-325 MG tablet Take 1 tablet by mouth every 6 (six) hours as needed for moderate pain. Qty: 20 tablet, Refills: 0    lisinopril (PRINIVIL,ZESTRIL) 2.5 MG tablet Take 2.5 mg by mouth daily.    pantoprazole (PROTONIX) 40 MG tablet Take 40 mg by mouth daily.     potassium chloride SA (K-DUR,KLOR-CON) 20 MEQ tablet Take 20 mEq by mouth daily.     sertraline (ZOLOFT) 100 MG tablet Take 100 mg by mouth daily.    tamsulosin (FLOMAX) 0.4 MG CAPS capsule Take 0.4 mg by mouth daily. Take 30 minutes after a meal      STOP taking these medications     meclizine (ANTIVERT) 25 MG tablet      metFORMIN (GLUCOPHAGE) 500 MG tablet      aspirin 81 MG chewable tablet          DISCHARGE INSTRUCTIONS:   Follow-up medical doctor at [facility]  If you experience worsening of your admission symptoms, develop shortness of breath, life threatening emergency, suicidal or homicidal thoughts you must seek medical attention immediately by calling 911 or calling your MD immediately  if symptoms less severe.  You Must read complete instructions/literature along with all the possible adverse reactions/side effects for all the Medicines you take and that have been prescribed to you. Take any new Medicines after you have completely understood and accept all the possible adverse reactions/side effects.   Please note  You were cared for by a hospitalist during your hospital stay. If you have any questions about your discharge medications or the care you received while you were in the hospital after you are  discharged, you can call the unit and asked to speak with the hospitalist on call if the hospitalist that took care of you is not available. Once you are discharged, your primary care physician will handle any further medical issues. Please note that NO REFILLS for any discharge medications will be authorized once you are discharged, as it is imperative that you return to your primary care physician (or establish a relationship with a primary care physician if you do not have one) for your aftercare needs so that they can reassess your need for medications and monitor your lab values.    Today   CHIEF COMPLAINT:   Back pain and unable to walk  HISTORY OF PRESENT ILLNESS:  Nicolas Morris  is a 82 y.o. male presented with  back pain.   VITAL SIGNS:  Blood pressure 111/61, pulse 75, temperature 97.8 F (36.6 C), temperature source Oral, resp. rate 18, height 6\' 2"  (1.88 m), weight 83.1 kg (183 lb 1.6 oz), SpO2 97 %.    PHYSICAL EXAMINATION:  GENERAL:  82 y.o.-year-old patient lying in the bed with no acute distress.  EYES: Pupils equal, round, reactive to light and accommodation. No scleral icterus. Extraocular muscles intact.  HEENT: Head atraumatic, normocephalic. Oropharynx and nasopharynx clear.  NECK:  Supple, no jugular venous distention. No thyroid enlargement, no tenderness.  LUNGS: Normal breath sounds bilaterally, no wheezing, rales,rhonchi or crepitation. No use of accessory muscles of respiration.  CARDIOVASCULAR: S1, S2 normal. No murmurs, rubs, or gallops.  ABDOMEN: Soft, non-tender, non-distended. Bowel sounds present. No organomegaly or mass.  EXTREMITIES: No pedal edema, cyanosis, or clubbing.  NEUROLOGIC: Cranial nerves II through XII are intact. Patient able to straight leg raise without a problem.  PSYCHIATRIC: The patient is alert.  SKIN: No obvious rash, lesion, or ulcer.   DATA REVIEW:   CBC  Recent Labs Lab 10/07/15 0425  WBC 5.9  HGB 14.0  HCT 41.6  PLT  149*    Chemistries   Recent Labs Lab 10/07/15 0425  NA 137  K 3.8  CL 105  CO2 24  GLUCOSE 75  BUN 22*  CREATININE 0.56*  CALCIUM 8.7*    Microbiology Results  Results for orders placed or performed during the hospital encounter of 10/06/15  Urine culture     Status: None   Collection Time: 10/06/15 11:49 AM  Result Value Ref Range Status   Specimen Description URINE, RANDOM  Final   Special Requests NONE  Final   Culture NO GROWTH Performed at [facility]   Final   Report Status 10/07/2015 FINAL  Final  MRSA PCR Screening     Status: Abnormal   Collection Time: 10/06/15  5:10 PM  Result Value Ref Range Status   MRSA by PCR (A) NEGATIVE Final    INVALID, UNABLE TO DETERMINE THE PRESENCE OF TARGET DNA DUE TO SPECIMEN INTEGRITY. RECOLLECTION REQUESTED.    Comment:        The GeneXpert MRSA Assay (FDA approved for NASAL specimens only), is one component of a comprehensive MRSA colonization surveillance program. It is not intended to diagnose MRSA infection nor to guide or monitor treatment for MRSA infections.   MRSA PCR Screening     Status: None   Collection Time: 10/06/15  8:35 PM  Result Value Ref Range Status   MRSA by PCR NEGATIVE NEGATIVE Final    Comment:        The GeneXpert MRSA Assay (FDA approved for NASAL specimens only), is one component of a comprehensive MRSA colonization surveillance program. It is not intended to diagnose MRSA infection nor to guide or monitor treatment for MRSA infections.     RADIOLOGY:  Ct Lumbar Spine Wo Contrast  Result Date: 10/06/2015 CLINICAL DATA:  Back and bladder pain, numerous falls in past 2 months, low back pain, question fracture or spinal stenosis EXAM: CT LUMBAR SPINE WITHOUT CONTRAST TECHNIQUE: Multidetector CT imaging of the lumbar spine was performed without intravenous contrast administration. Multiplanar CT image reconstructions were also generated. COMPARISON:  CT lumbar spine 09/28/2015  FINDINGS: Prior exam labeled with 5 lumbar vertebra, current exam labeled accordingly. Marked osseous demineralization. Compression fracture L1 vertebral body with slightly increased central height loss and increased vacuum phenomenon versus gas within vertebral body. Mild retropulsion of a  posterior endplate fragment superiorly, only mildly narrowing the AP diameter of the spinal canal. Remaining vertebral body heights maintained. Multilevel disc space narrowing and endplate spur formation. No additional fracture or subluxation. Atherosclerotic calcification aorta and iliac arteries. Cyst at medial aspect inferior pole LEFT kidney 2.5 x 2.2 cm image 63. T11-T12:  Minimal disc bulge. T12-L1: Minimal retropulsion of superior endplate fragment of L1 as previously noted. Foramina patent. No disc herniation. L1-L2:  Minimal disc bulge.  Foramina patent. L2-L3: Disc space narrowing and endplate spur formation. Bulging disc. Facet hypertrophy and ligamentum flavum thickening. Mild AP narrowing of the spinal canal. No disk herniation. Foramina patent. No definite neural compression. L3-L4: Disc bulge and endplate spur formation combine with facet hypertrophy and ligamentum flavum thickening to create mild AP narrowing of spinal canal. Bony foramina patent. No definite disc herniation or neural compression. L4-L5: Moderate to severe central acquired spinal stenosis, multifactorial due to endplate spur formation, disc bulge, facet hypertrophy and ligamentum flavum thickening. L4 roots exit without compression. Probable BILATERAL lateral recess stenosis, question mass effect upon BILATERAL L5 roots. No definite focal disc herniation. L5-S1: Multifactorial central acquired spinal stenosis moderate to severe in degree due to disc bulge, ligamentum flavum thickening, minimal endplate spurring, and facet hypertrophy. BILATERAL lateral recess stenosis with compression of the S1 roots bilaterally. Question far LEFT lateral disc  herniation with mass effect upon the LEFT L5 root. IMPRESSION: Multilevel degenerative disc disease changes of the lumbar spine as above with central acquired spinal stenosis at L4-L5 and L5-S1, less L3-L4. BILATERAL lateral recess stenosis at L5-S1 with mass effect upon the S1 roots. BILATERAL lateral recess stenosis at L4-L5 with questionable mass effect upon the L5 roots. Far LEFT lateral disc herniation at L5-S1 with mass effect upon LEFT L5 root. Increased compression fracture of L1 vertebral body since previous exam with mild retropulsion of a posterior endplate fragment, mildly narrowing the AP diameter of the spinal canal. Aortic atherosclerosis. Electronically Signed   By: Ulyses Southward M.D.   On: 10/06/2015 13:24    Management plans discussed with the patient, family and they are in agreement.  CODE STATUS:     Code Status Orders        Start     Ordered   10/06/15 1701  Do not attempt resuscitation (DNR)  Continuous    Question Answer Comment  In the event of cardiac or respiratory ARREST Do not call a "code blue"   In the event of cardiac or respiratory ARREST Do not perform Intubation, CPR, defibrillation or ACLS   In the event of cardiac or respiratory ARREST Use medication by any route, position, wound care, and other measures to relive pain and suffering. May use oxygen, suction and manual treatment of airway obstruction as needed for comfort.      10/06/15 1700    Code Status History    Date Active Date Inactive Code Status Order ID Comments User Context   08/23/2015  2:00 AM 08/24/2015  3:49 PM DNR [id number]  Oralia Manis, MD Inpatient   08/25/2014 11:33 PM 08/30/2014  3:25 PM DNR [id number]  Oralia Manis, MD Inpatient   08/25/2014 10:30 PM 08/25/2014 11:33 PM Full Code [id number]  Katharina Caper, MD Inpatient   08/25/2014  8:32 PM 08/25/2014 10:30 PM DNR [id number]  Katharina Caper, MD ED    Advance Directive Documentation   Flowsheet Row Most Recent Value  Type of Advance Directive  Out of  facility DNR (pink MOST or yellow form)  Pre-existing  out of facility DNR order (yellow form or pink MOST form)  No data  "MOST" Form in Place?  No data      TOTAL TIME TAKING CARE OF THIS PATIENT: 35 minutes.    Alford Highland M.D on 10/08/2015 at 8:38 AM  Between 7am to 6pm - Pager - [phone]  After 6pm go to [email or web address] - Social research officer, government  [facility] Office  [phone]  CC: Primary care physician; WHITE, Arlyss Repress, NP

## 2015-10-08 NOTE — Clinical Social Work Note (Signed)
Patient to dc to [facility] via EMS due to AMS. Facility and family aware. CSW signing off.  Nicolas Morris, MSW, LCSW-A [phone]

## 2015-10-08 NOTE — Progress Notes (Signed)
Pt transferred to [facility] by EMS. Rx's and D/C summary reviewed, report given to [place], Charity fundraiser.

## 2015-10-08 NOTE — Discharge Instructions (Signed)

## 2015-10-09 ENCOUNTER — Other Ambulatory Visit: Payer: Self-pay | Admitting: Physician Assistant

## 2015-10-09 DIAGNOSIS — S32010A Wedge compression fracture of first lumbar vertebra, initial encounter for closed fracture: Secondary | ICD-10-CM

## 2015-12-20 DEATH — deceased

## 2016-04-18 ENCOUNTER — Encounter: Payer: Self-pay | Admitting: Cardiology

## 2017-05-16 ENCOUNTER — Other Ambulatory Visit: Payer: Self-pay | Admitting: Nurse Practitioner

## 2017-09-30 IMAGING — CT CT L SPINE W/O CM
3 of 4 series · 11 of 33 positions shown, 13 images · non-contrast
Comparison: CT, 04/11/2015

CLINICAL DATA: Pt arrives to ER via [facility] from [HOSPITAL] after near
syncope while sitting on toilet. Pt fell, resulting in neck and back
pain. Pt arrives with c-collar. Pt disoriented to time and place,
oriented to situation. Pt hx of dementia. CBG 96 with [facility]. Pt
found to be hypotensive with the staff at [facility], 96 systolic. EMS
reports normal BP and HR. TKV

EXAM:
CT LUMBAR SPINE WITHOUT CONTRAST
TECHNIQUE: Multidetector CT imaging of the lumbar spine was performed without
intravenous contrast administration. Multiplanar CT image
reconstructions were also generated.

[Series 4: l spine soft · axial · 0.33mm/px · z∈[-670,-494]mm · 3 of 132 slices shown, 4 images]
[im 22/132  soft-tissue]
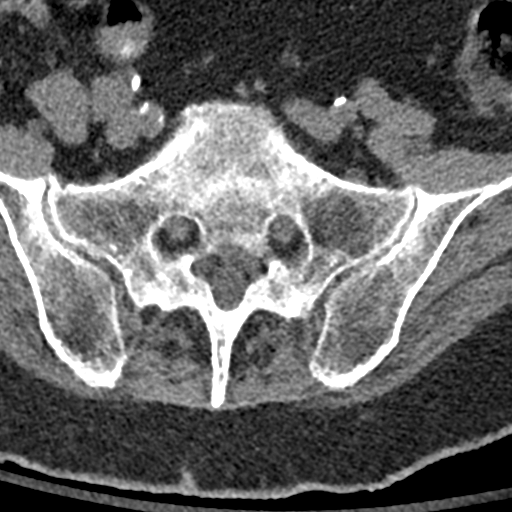
[im 22/132  bone]
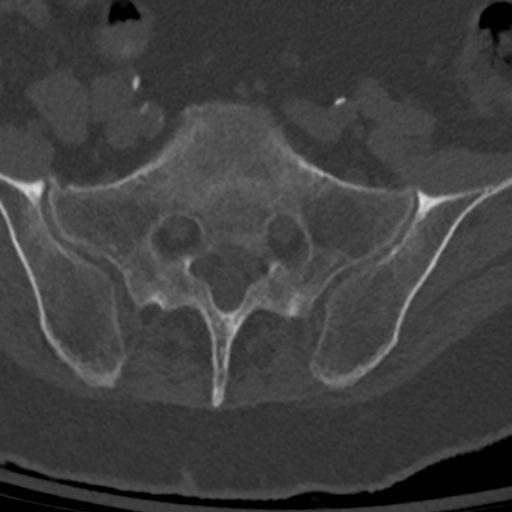
[im 66/132  bone]
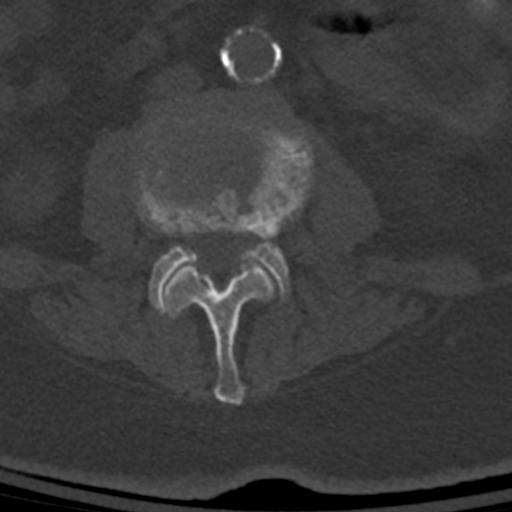
[im 110/132  bone]
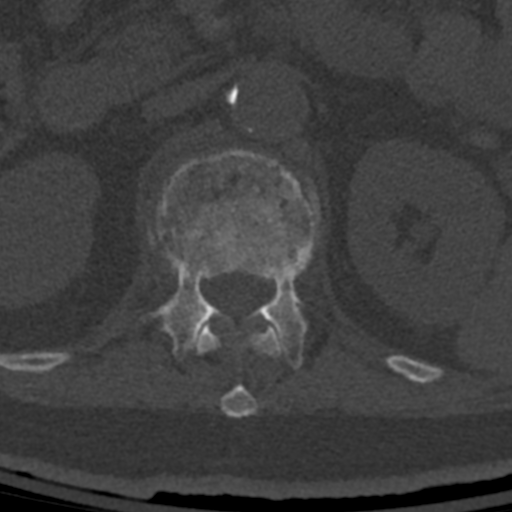

[Series 5: sagittal bone · sagittal · 0.39mm/px · 5 of 90 slices shown, 6 images]
[im 30/90  bone]
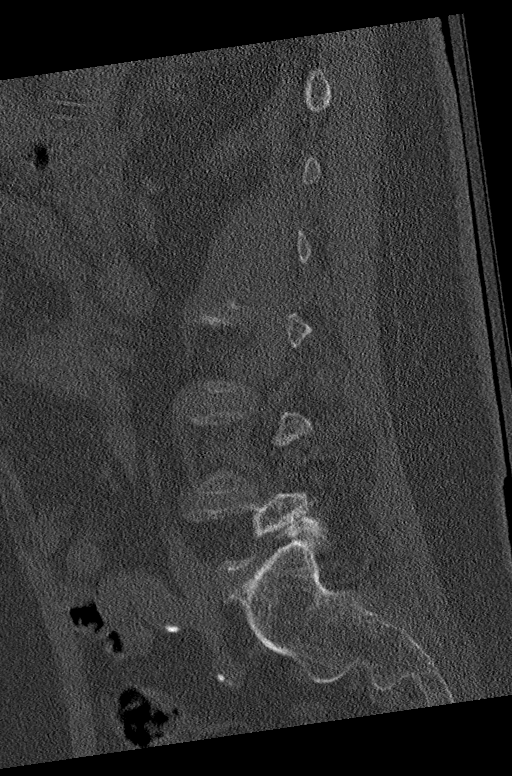
[im 38/90  bone]
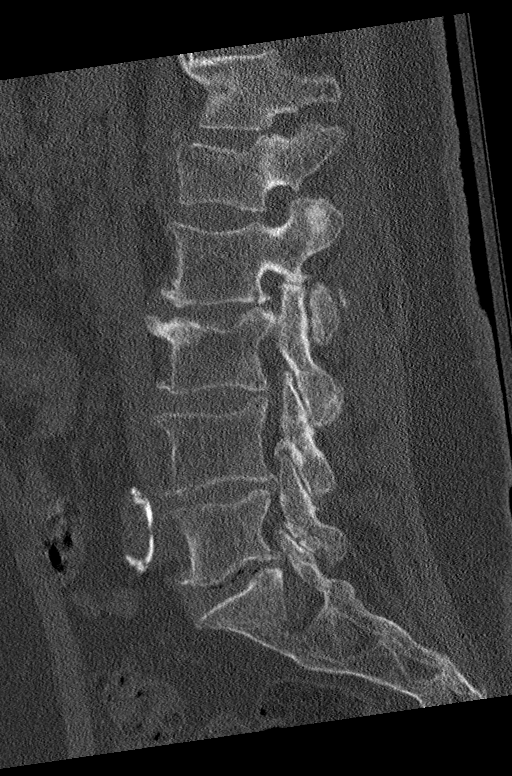
[im 45/90  soft-tissue]
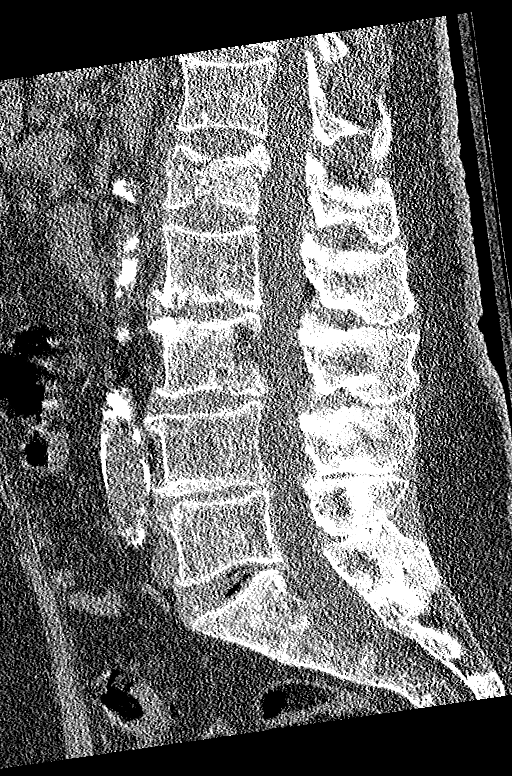
[im 45/90  bone]
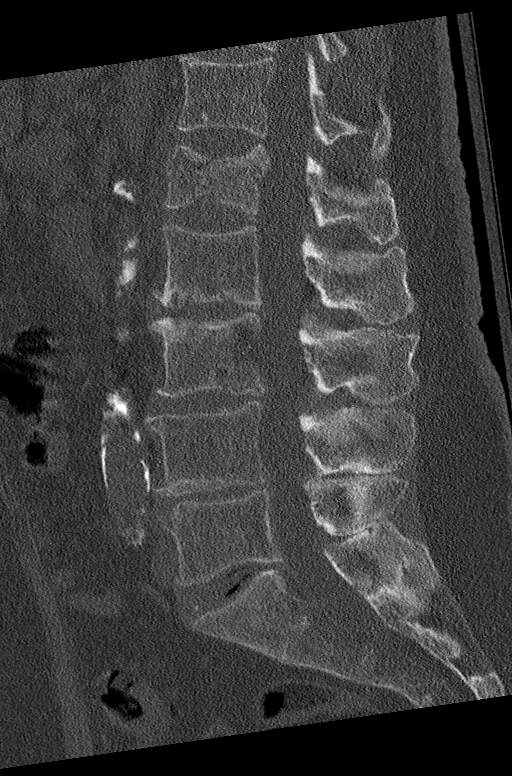
[im 52/90  bone]
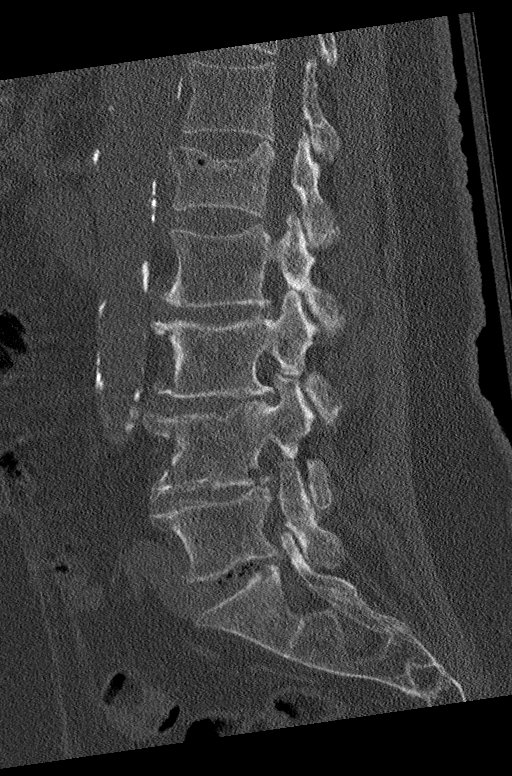
[im 60/90  bone]
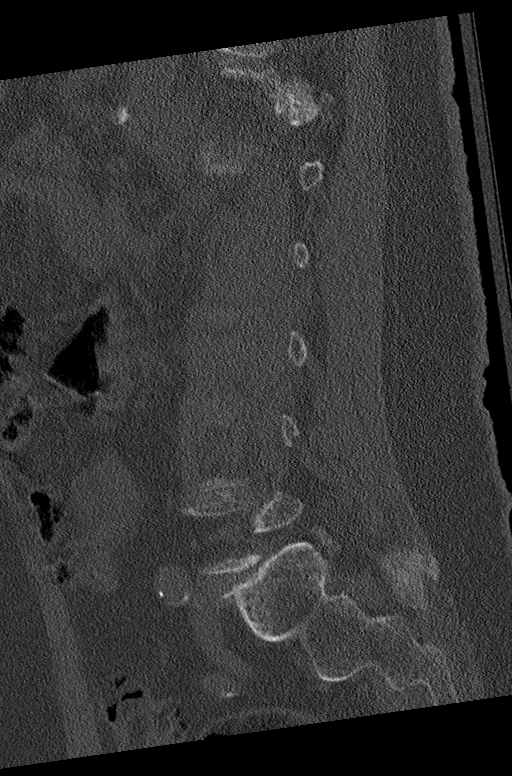

[Series 6: coronal bone · coronal · 0.35mm/px · 3 of 75 slices shown]
[im 15/75  bone]
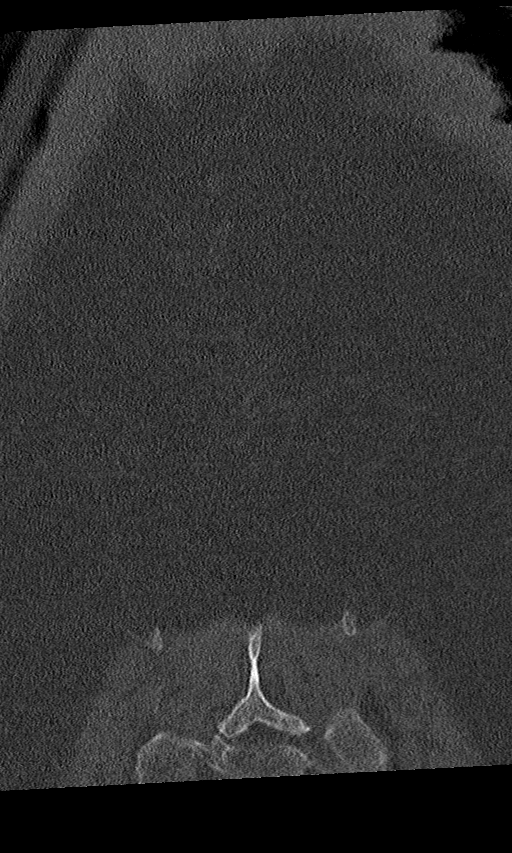
[im 30/75  bone]
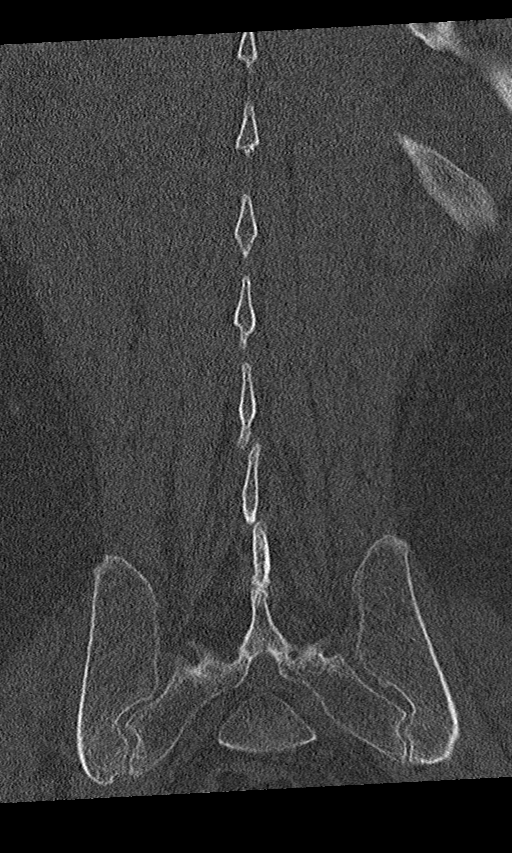
[im 45/75  bone]
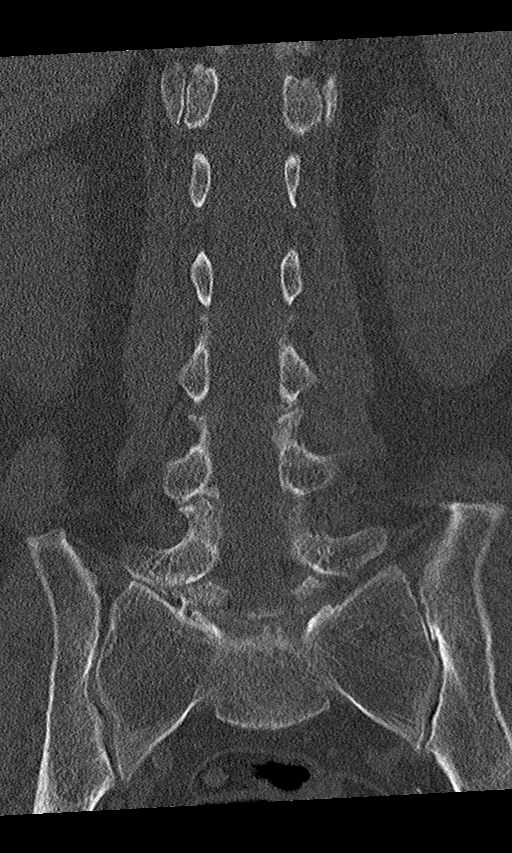

[11 of 33 positions shown; findings below may reference images not displayed]

FINDINGS: There is an acute fracture of the L1 vertebra with depression of the
upper endplate and mild bulging of the posterior upper vertebral
body cortex into the ventral epidural space without significant
stenosis. No retropulsed bony fragment. Separate fracture line along
an oblique coronal plane intersects the inferior endplate. The
posterior elements are intact. Have

No other fractures. Remaining lumbar vertebrae are normal in height.
There is no spondylolisthesis.

Mild loss disc height at L2-L3 and L3-L4 with moderate loss disc
height at L4-L5. Mild loss disc height at L5-S1. Endplate spurring
and disc bulging noted at these levels.

T12-L1: No significant disc bulging. No disc herniation. Central
spinal canal and neural foramina are well preserved. Below the disc
level, the posterior bulging of the fractured upper L1 vertebra
flattens the ventral thecal sac. The anterior posterior dimension of
the spinal canal is relatively well maintained measuring 11 mm.

L1-L2: No significant disc bulging. No disc herniation. No stenosis.

L2-L3: Mild diffuse disc bulging. Minor facet degenerative change.
Ligamentum flavum are mildly thickened. No significant central
spinal canal or lateral recess narrowing. Neural foramina are
relatively well preserved.

L3-L4: Mild facet degenerative change. Minor disc bulging. Central
spinal canal lateral recesses are relatively well preserved. No
significant neural foraminal narrowing.

L4-L5: Diffuse disc bulging with mild facet degenerative change.
Central spinal canal is narrowed to 9 mm in its anterior posterior
dimension. There is mild encroachment upon the superior lateral
recesses. The neural foramina are well preserved.

L5-S1: Mild diffuse disc bulging mild facet degenerative change. No
significant stenosis.

Soft tissues demonstrate diffuse aortoiliac vascular calcifications.
No aneurysm.

A low-density lesion new measuring 2.4 cm arises from the medial
lower pole left kidney consistent with a cyst. No masses or
adenopathy. Diverticula are noted along the visualized sigmoid
colon.
IMPRESSION: 1. Acute fracture of the L1 vertebra with mild loss of the vertebral
body height. There is mild posterior bulging of the posterior upper
endplate of the L1 vertebrae without significant stenosis. No
retropulsed vertebral fracture fragment. The posterior elements are
intact.
2. No other acute findings.
3. Degenerative changes as described. No disc herniations. No
significant stenosis.

## 2017-09-30 IMAGING — CT CT CERVICAL SPINE W/O CM
3 of 4 series · 11 of 33 positions shown, 13 images · non-contrast
Comparison: CT scan of September 15, 2015.

CLINICAL DATA: Neck pain after fall.

EXAM:
CT CERVICAL SPINE WITHOUT CONTRAST
TECHNIQUE: Multidetector CT imaging of the cervical spine was performed without
intravenous contrast. Multiplanar CT image reconstructions were also
generated.

[Series 4: sagittal bone · sagittal · 0.32mm/px · 5 of 52 slices shown, 6 images]
[im 18/52  bone]
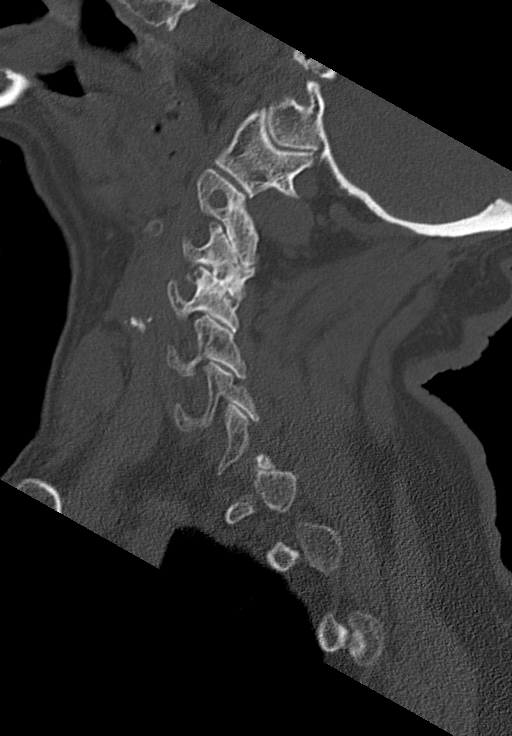
[im 22/52  bone]
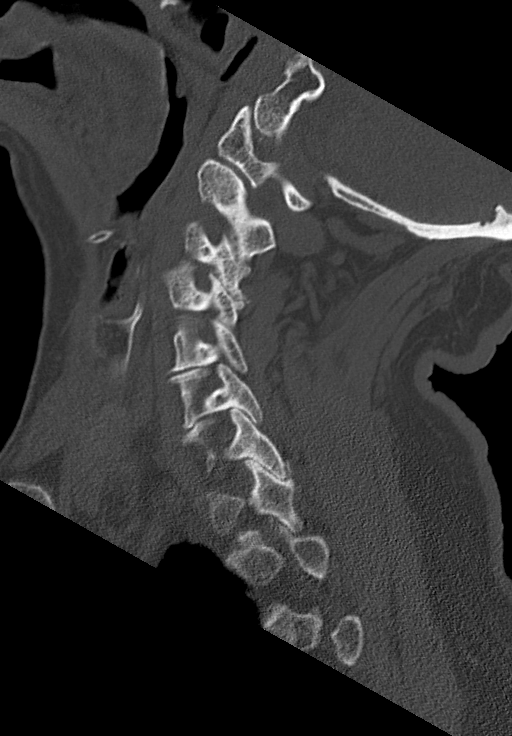
[im 26/52  soft-tissue]
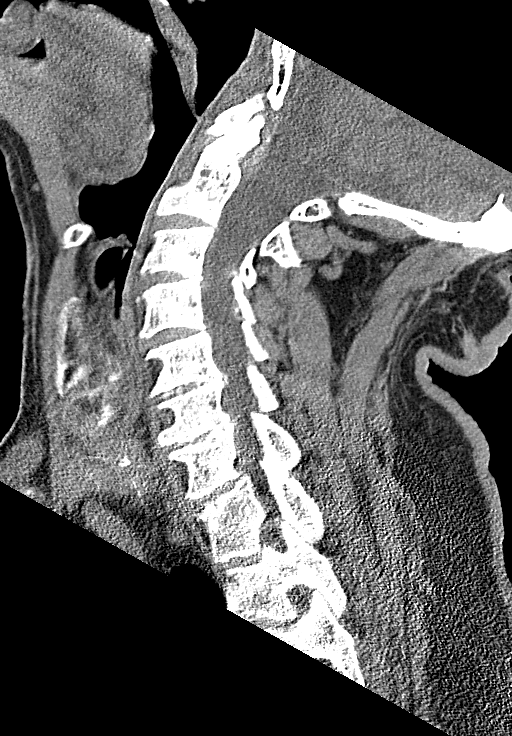
[im 26/52  bone]
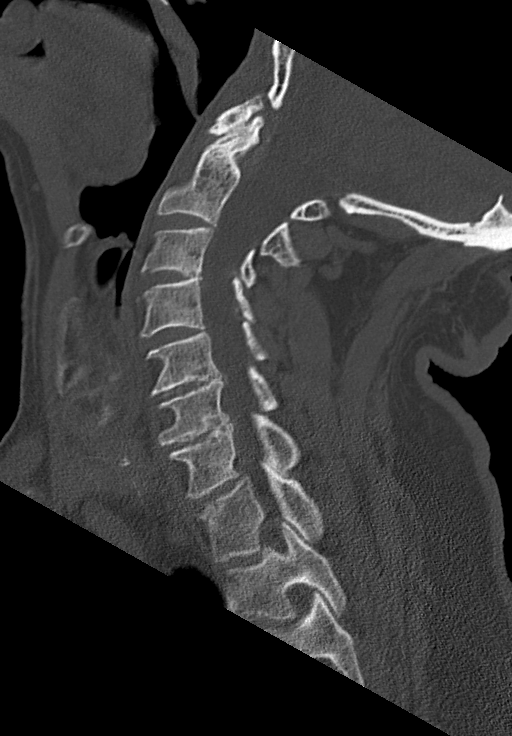
[im 30/52  bone]
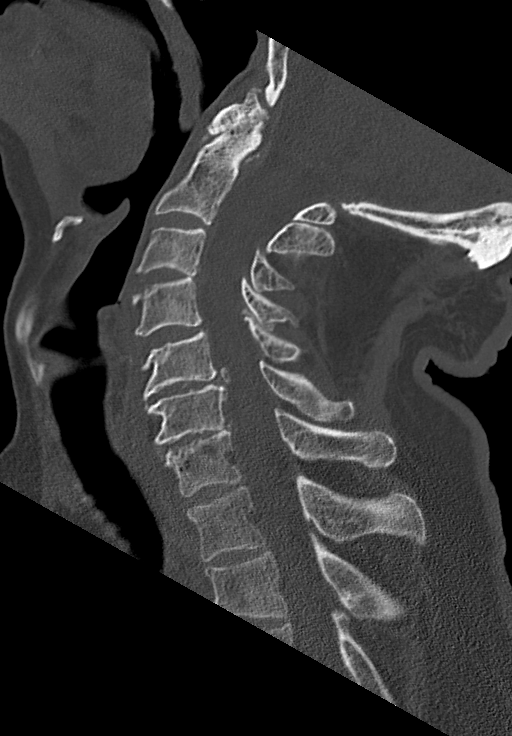
[im 35/52  bone]
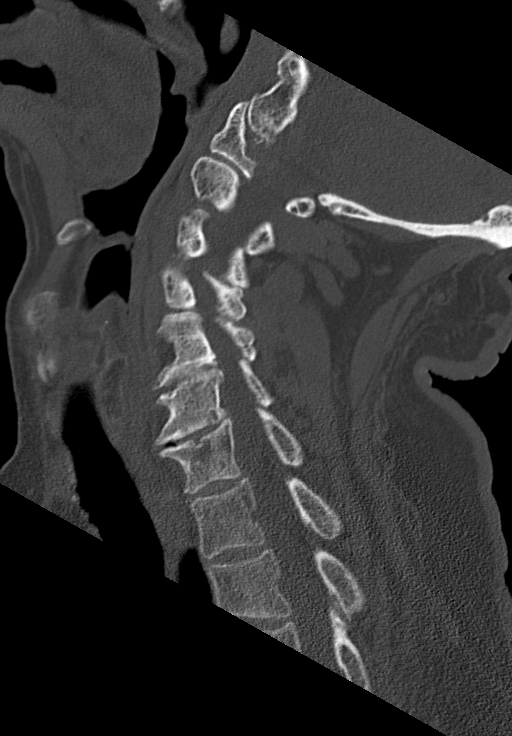

[Series 7: coronal bone · coronal · 0.25mm/px · 3 of 66 slices shown]
[im 18/66  bone]
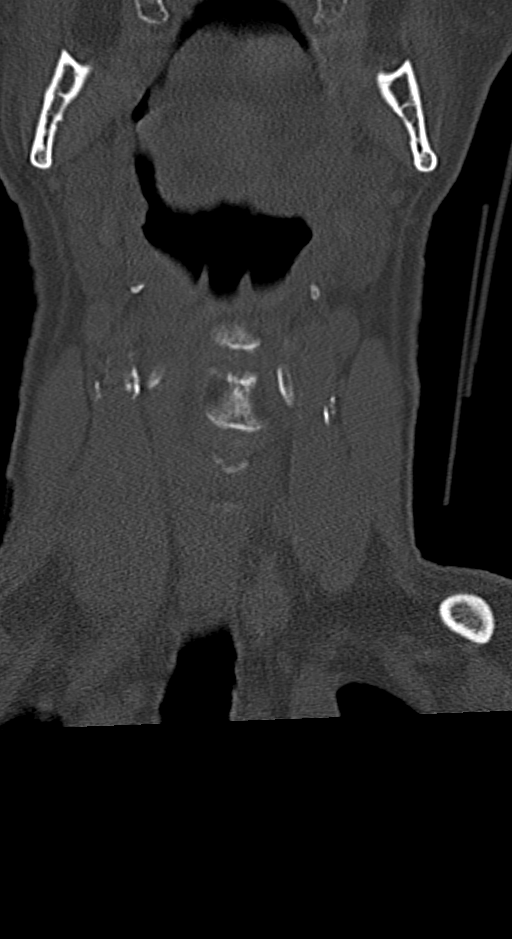
[im 28/66  bone]
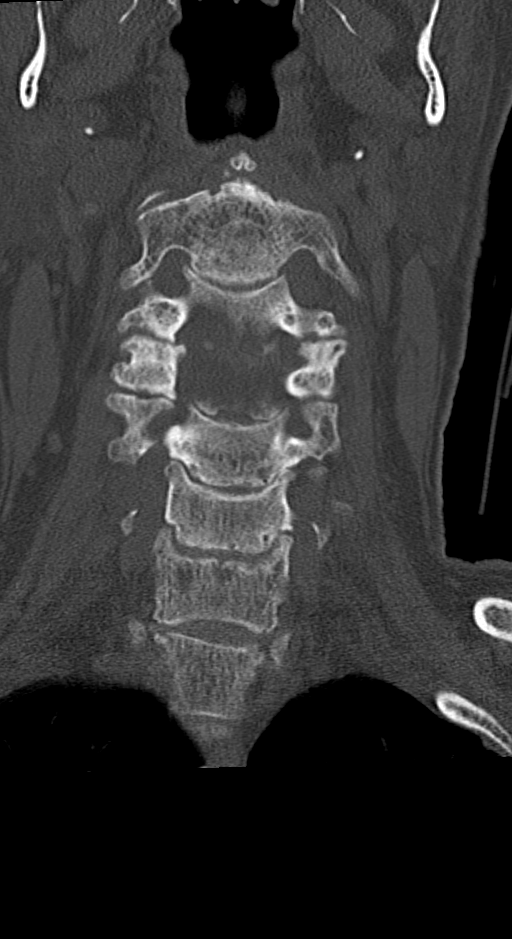
[im 38/66  bone]
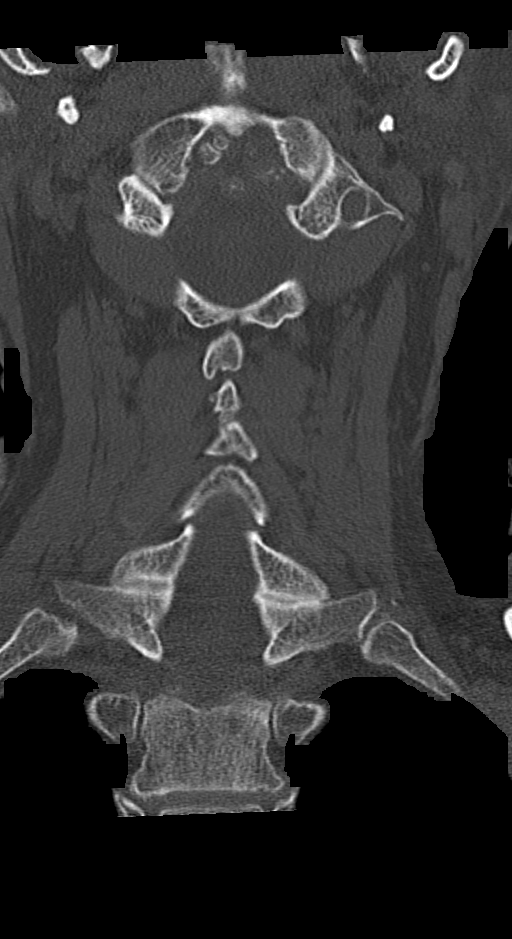

[Series 8: orthogonal bone · axial · 0.24mm/px · z∈[-236,-119]mm · 3 of 135 slices shown, 4 images]
[im 34/135  soft-tissue]
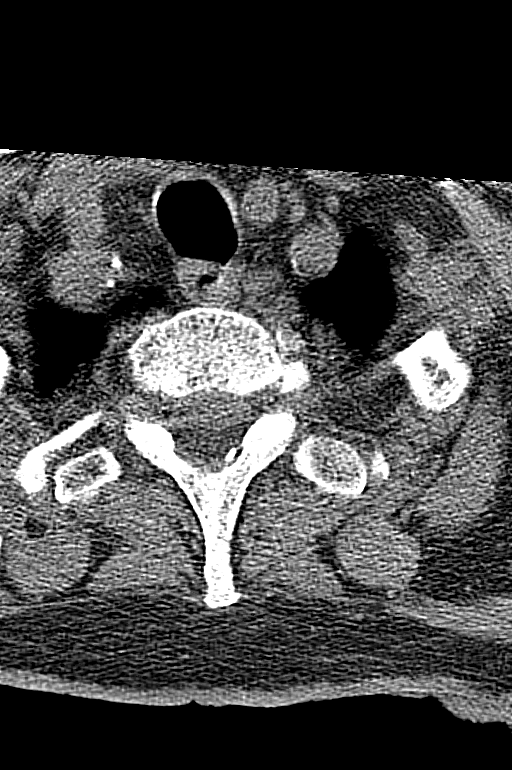
[im 34/135  bone]
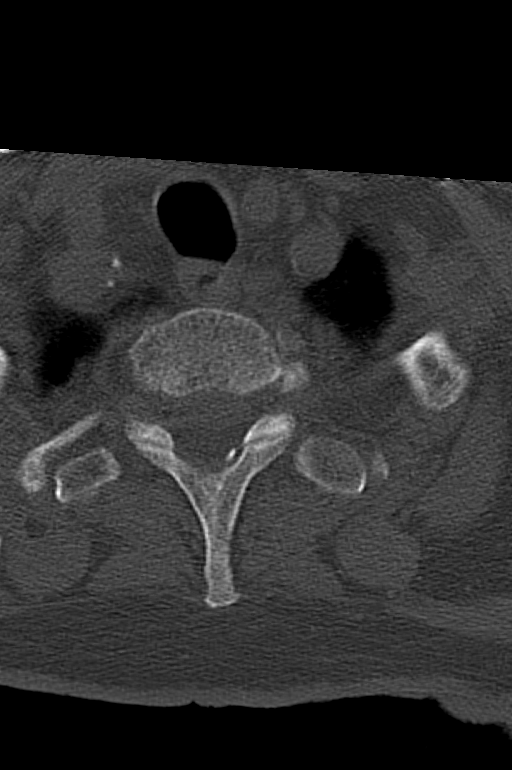
[im 68/135  bone]
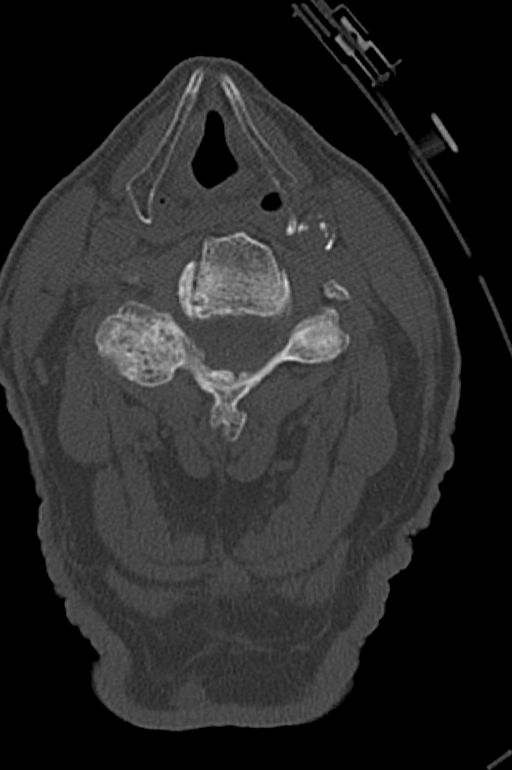
[im 101/135  bone]
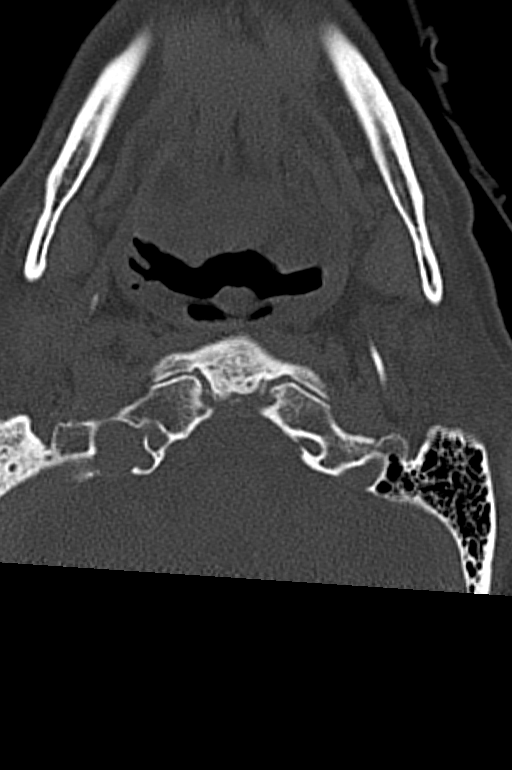

[11 of 33 positions shown; findings below may reference images not displayed]

FINDINGS: No fracture or spondylolisthesis is noted. Moderate degenerative
disc disease is noted at C5-6 and C6-7 appear hypertrophy of
posterior facet joints of upper cervical spine is noted consistent
with degenerative change. Visualized lung apices appear normal.
IMPRESSION: Multilevel degenerative disc disease. No acute abnormality seen in
the cervical spine.
